# Patient Record
Sex: Male | Born: 1950 | Race: White | Hispanic: No | Marital: Married | Smoking: Former smoker
Health system: Southern US, Community
[De-identification: ages and names within clinical notes are randomized; demographics above are authoritative.]

## PROBLEM LIST (undated history)

## (undated) DIAGNOSIS — R011 Cardiac murmur, unspecified: Secondary | ICD-10-CM

## (undated) DIAGNOSIS — I10 Essential (primary) hypertension: Secondary | ICD-10-CM

## (undated) DIAGNOSIS — E785 Hyperlipidemia, unspecified: Secondary | ICD-10-CM

## (undated) DIAGNOSIS — H409 Unspecified glaucoma: Secondary | ICD-10-CM

## (undated) HISTORY — PX: OTHER SURGICAL HISTORY: SHX169

## (undated) HISTORY — DX: Unspecified glaucoma: H40.9

## (undated) HISTORY — DX: Hyperlipidemia, unspecified: E78.5

## (undated) HISTORY — PX: COLONOSCOPY: SHX174

## (undated) HISTORY — PX: EYE SURGERY: SHX253

## (undated) HISTORY — PX: HERNIA REPAIR: SHX51

## (undated) SURGERY — ROBOTIC ASSISTED LAPAROSCOPIC CHOLECYSTECTOMY
Anesthesia: General

## (undated) SURGERY — ROBOTIC ASSISTED LAPAROSCOPIC CHOLECYSTECTOMY
Anesthesia: Choice

---

## 2006-05-26 DIAGNOSIS — Z860101 Personal history of adenomatous and serrated colon polyps: Secondary | ICD-10-CM | POA: Insufficient documentation

## 2006-10-25 ENCOUNTER — Emergency Department: Payer: Self-pay | Admitting: Emergency Medicine

## 2006-10-25 ENCOUNTER — Other Ambulatory Visit: Payer: Self-pay

## 2007-06-07 ENCOUNTER — Emergency Department: Payer: Self-pay | Admitting: Emergency Medicine

## 2007-06-07 ENCOUNTER — Other Ambulatory Visit: Payer: Self-pay

## 2013-12-17 ENCOUNTER — Ambulatory Visit: Payer: Self-pay | Admitting: Internal Medicine

## 2013-12-17 LAB — CBC WITH DIFFERENTIAL/PLATELET
BASOS PCT: 0.6 %
Basophil #: 0.1 10*3/uL (ref 0.0–0.1)
EOS PCT: 1.9 %
Eosinophil #: 0.2 10*3/uL (ref 0.0–0.7)
HCT: 47.2 % (ref 40.0–52.0)
HGB: 15.9 g/dL (ref 13.0–18.0)
Lymphocyte #: 1.4 10*3/uL (ref 1.0–3.6)
Lymphocyte %: 14.3 %
MCH: 29.1 pg (ref 26.0–34.0)
MCHC: 33.7 g/dL (ref 32.0–36.0)
MCV: 86 fL (ref 80–100)
MONO ABS: 0.8 x10 3/mm (ref 0.2–1.0)
MONOS PCT: 8.2 %
NEUTROS ABS: 7.6 10*3/uL — AB (ref 1.4–6.5)
NEUTROS PCT: 75 %
PLATELETS: 269 10*3/uL (ref 150–440)
RBC: 5.48 10*6/uL (ref 4.40–5.90)
RDW: 13.6 % (ref 11.5–14.5)
WBC: 10.1 10*3/uL (ref 3.8–10.6)

## 2013-12-17 LAB — COMPREHENSIVE METABOLIC PANEL
Albumin: 3.7 g/dL (ref 3.4–5.0)
Alkaline Phosphatase: 77 U/L
Anion Gap: 7 (ref 7–16)
BUN: 9 mg/dL (ref 7–18)
Bilirubin,Total: 0.5 mg/dL (ref 0.2–1.0)
CHLORIDE: 101 mmol/L (ref 98–107)
Calcium, Total: 9.4 mg/dL (ref 8.5–10.1)
Co2: 30 mmol/L (ref 21–32)
Creatinine: 0.93 mg/dL (ref 0.60–1.30)
EGFR (Non-African Amer.): >60
GLUCOSE: 115 mg/dL — AB (ref 65–99)
Osmolality: 275 (ref 275–301)
POTASSIUM: 4.3 mmol/L (ref 3.5–5.1)
SGOT(AST): 15 U/L (ref 15–37)
SGPT (ALT): 29 U/L
Sodium: 138 mmol/L (ref 136–145)
Total Protein: 7.8 g/dL (ref 6.4–8.2)

## 2013-12-17 LAB — AMYLASE: Amylase: 33 U/L (ref 25–115)

## 2013-12-17 LAB — LIPASE, BLOOD: Lipase: 114 U/L (ref 73–393)

## 2014-01-05 DIAGNOSIS — R030 Elevated blood-pressure reading, without diagnosis of hypertension: Secondary | ICD-10-CM

## 2014-01-05 DIAGNOSIS — K219 Gastro-esophageal reflux disease without esophagitis: Secondary | ICD-10-CM | POA: Insufficient documentation

## 2014-01-05 DIAGNOSIS — M25511 Pain in right shoulder: Secondary | ICD-10-CM | POA: Insufficient documentation

## 2014-01-05 DIAGNOSIS — IMO0001 Reserved for inherently not codable concepts without codable children: Secondary | ICD-10-CM | POA: Insufficient documentation

## 2014-06-23 DIAGNOSIS — K644 Residual hemorrhoidal skin tags: Secondary | ICD-10-CM | POA: Insufficient documentation

## 2017-06-20 ENCOUNTER — Ambulatory Visit
Admission: EM | Admit: 2017-06-20 | Discharge: 2017-06-20 | Disposition: A | Discharge status: Home / Self Care | Attending: Family Medicine | Admitting: Family Medicine

## 2017-06-20 ENCOUNTER — Encounter: Payer: Self-pay | Admitting: Gynecology

## 2017-06-20 DIAGNOSIS — R05 Cough: Secondary | ICD-10-CM | POA: Diagnosis not present

## 2017-06-20 DIAGNOSIS — R030 Elevated blood-pressure reading, without diagnosis of hypertension: Secondary | ICD-10-CM | POA: Diagnosis not present

## 2017-06-20 DIAGNOSIS — J0140 Acute pansinusitis, unspecified: Secondary | ICD-10-CM

## 2017-06-20 DIAGNOSIS — R059 Cough, unspecified: Secondary | ICD-10-CM

## 2017-06-20 MED ORDER — DOXYCYCLINE HYCLATE 100 MG PO CAPS: 100.0000 mg | ORAL_CAPSULE | Freq: Two times a day (BID) | ORAL | 0 refills | Status: AC

## 2017-06-20 NOTE — Discharge Instructions (Addendum)
Recommend start Doxycycline 100mg  twice a day as directed. Increase fluid intake to help loosen up mucus. May take OTC cough medication as needed to help sleep at night. Follow-up here in 4 to 5 days if not improving.

## 2017-06-20 NOTE — ED Triage Notes (Signed)
Patient c/o cough x 1 month.

## 2017-06-20 NOTE — ED Provider Notes (Signed)
[facility]    CSN: [id number] Arrival date & time: 06/20/17  1210     History   Chief Complaint Chief Complaint  Patient presents with  . Cough    HPI Derek Figueroa is a 67 y.o. male.   67 year old male presents with sinus congestion, fatigue and cough for over 1 month. Also having some sinus pressure and post nasal drainage that is causing him to cough. Denies any fever or GI symptoms. Has history of recurrent sinus infections but last infection was over 2 to 3 years ago. Has taken Amoxicillin and Augmentin in the past with minimal success. Has take OTC cold and cough medication with minimal relief.  Also has history of HTN- not currently on any medication. Otherwise, no other chronic health issues. Takes no daily medication.    The history is provided by the patient.    History reviewed. No pertinent past medical history.  There are no active problems to display for this patient.   Past Surgical History:  Procedure Laterality Date  . EYE SURGERY         Home Medications    Prior to Admission medications   Medication Sig Start Date End Date Taking? Authorizing Provider  doxycycline (VIBRAMYCIN) 100 MG capsule Take 1 capsule (100 mg total) by mouth 2 (two) times daily for 7 days. 06/20/17 06/27/17  Katy Apo, NP    Family History Family History  Problem Relation Age of Onset  . Heart disease Mother   . Diabetes Mother   . Heart disease Father     Social History Social History   Tobacco Use  . Smoking status: Former Research scientist (life sciences)  . Smokeless tobacco: Never Used  . Tobacco comment: quit 40 yrs  Substance Use Topics  . Alcohol use: Yes  . Drug use: No     Allergies   Patient has no known allergies.   Review of Systems Review of Systems  Constitutional: Positive for fatigue. Negative for activity change, appetite change, chills and fever.  HENT: Positive for congestion, postnasal drip, rhinorrhea, sinus pressure, sinus pain and  sore throat. Negative for ear discharge, ear pain, facial swelling, mouth sores, nosebleeds and trouble swallowing.   Eyes: Negative for pain, discharge, redness and itching.  Respiratory: Positive for cough. Negative for chest tightness, shortness of breath and wheezing.   Gastrointestinal: Negative for abdominal pain, diarrhea, nausea and vomiting.  Musculoskeletal: Negative for arthralgias, myalgias, neck pain and neck stiffness.  Skin: Negative for rash and wound.  Allergic/Immunologic: Negative for immunocompromised state.  Neurological: Positive for headaches. Negative for dizziness, tremors, seizures, syncope, speech difficulty, weakness, light-headedness and numbness.  Hematological: Negative for adenopathy. Does not bruise/bleed easily.     Physical Exam Triage Vital Signs ED Triage Vitals  Enc Vitals Group     BP 06/20/17 1250 (!) 146/96     Pulse Rate 06/20/17 1250 64     Resp 06/20/17 1250 16     Temp 06/20/17 1250 98 F (36.7 C)     Temp Source 06/20/17 1250 Oral     SpO2 06/20/17 1250 99 %     Weight 06/20/17 1249 180 lb (81.6 kg)     Height 06/20/17 1249 5\' 8"  (1.727 m)     Head Circumference --      Peak Flow --      Pain Score 06/20/17 1249 0     Pain Loc --      Pain Edu? --  Excl. in [place]? --    No data found.  Updated Vital Signs BP (!) 146/96 (BP Location: Left Arm)   Pulse 64   Temp 98 F (36.7 C) (Oral)   Resp 16   Ht 5\' 8"  (1.727 m)   Wt 180 lb (81.6 kg)   SpO2 99%   BMI 27.37 kg/m   Visual Acuity Right Eye Distance:   Left Eye Distance:   Bilateral Distance:    Right Eye Near:   Left Eye Near:    Bilateral Near:     Physical Exam  Constitutional: He is oriented to person, place, and time. He appears well-developed and well-nourished. He does not appear ill. No distress.  HENT:  Head: Normocephalic and atraumatic.  Right Ear: Hearing, external ear and ear canal normal. Tympanic membrane is bulging.  Left Ear: Hearing, external ear  and ear canal normal. Tympanic membrane is bulging.  Nose: Mucosal edema and rhinorrhea present. Right sinus exhibits maxillary sinus tenderness and frontal sinus tenderness. Left sinus exhibits no maxillary sinus tenderness and no frontal sinus tenderness.  Mouth/Throat: Uvula is midline and mucous membranes are normal. Posterior oropharyngeal erythema present.  Eyes: Conjunctivae and EOM are normal.  Neck: Normal range of motion. Neck supple.  Cardiovascular: Normal rate, regular rhythm and normal heart sounds.  No murmur heard. Pulmonary/Chest: Effort normal and breath sounds normal. No stridor. No respiratory distress. He has no decreased breath sounds. He has no wheezes. He has no rhonchi. He has no rales.  Musculoskeletal: Normal range of motion.  Lymphadenopathy:    He has no cervical adenopathy.  Neurological: He is alert and oriented to person, place, and time.  Skin: Skin is warm and dry. Capillary refill takes less than 2 seconds.  Psychiatric: He has a normal mood and affect. His behavior is normal. Judgment and thought content normal.     UC Treatments / Results  Labs (all labs ordered are listed, but only abnormal results are displayed) Labs Reviewed - No data to display  EKG  EKG Interpretation None       Radiology No results found.  Procedures Procedures (including critical care time)  Medications Ordered in UC Medications - No data to display   Initial Impression / Assessment and Plan / UC Course  I have reviewed the triage vital signs and the nursing notes.  Pertinent labs & imaging results that were available during my care of the patient were reviewed by me and considered in my medical decision making (see chart for details).    Discussed with patient that he probably has another sinus infection. May start Doxycycline 100mg  twice a day as directed. Increase fluid intake to help loosen up mucus. May take OTC cough medication as needed to help sleep at  night. Avoid OTC decongestants which can raise his blood pressure. Continue to monitor BP. Follow-up here in 4 to 5 days if not improving.    Final Clinical Impressions(s) / UC Diagnoses   Final diagnoses:  Acute non-recurrent pansinusitis  Cough  Elevated blood pressure reading    ED Discharge Orders        Ordered    doxycycline (VIBRAMYCIN) 100 MG capsule  2 times daily     06/20/17 1423       Controlled Substance Prescriptions [place] Controlled Substance Registry consulted? Not Applicable   Katy Apo, NP 06/20/17 1926

## 2017-06-23 ENCOUNTER — Telehealth: Payer: Self-pay | Admitting: *Deleted

## 2017-06-23 NOTE — Telephone Encounter (Signed)
Called patient, no answer, left message advising patient to follow up with PCP or [facility] if symptoms persist or become worse.

## 2017-08-05 ENCOUNTER — Other Ambulatory Visit: Payer: Self-pay

## 2017-08-05 ENCOUNTER — Telehealth: Payer: Self-pay

## 2017-08-05 ENCOUNTER — Ambulatory Visit
Admission: RE | Admit: 2017-08-05 | Discharge: 2017-08-05 | Disposition: A | Discharge status: Home / Self Care | Source: Ambulatory Visit | Attending: Surgery | Admitting: Surgery

## 2017-08-05 ENCOUNTER — Ambulatory Visit
Admission: EM | Admit: 2017-08-05 | Discharge: 2017-08-05 | Disposition: A | Discharge status: Other Acute Inpatient Hospital | Attending: Family Medicine | Admitting: Family Medicine

## 2017-08-05 ENCOUNTER — Ambulatory Visit
Admission: RE | Admit: 2017-08-05 | Discharge: 2017-08-05 | Disposition: A | Discharge status: Home / Self Care | Source: Ambulatory Visit | Attending: Family Medicine | Admitting: Family Medicine

## 2017-08-05 DIAGNOSIS — R17 Unspecified jaundice: Secondary | ICD-10-CM

## 2017-08-05 DIAGNOSIS — K7689 Other specified diseases of liver: Secondary | ICD-10-CM | POA: Insufficient documentation

## 2017-08-05 DIAGNOSIS — N400 Enlarged prostate without lower urinary tract symptoms: Secondary | ICD-10-CM | POA: Insufficient documentation

## 2017-08-05 DIAGNOSIS — R748 Abnormal levels of other serum enzymes: Secondary | ICD-10-CM | POA: Insufficient documentation

## 2017-08-05 DIAGNOSIS — D7389 Other diseases of spleen: Secondary | ICD-10-CM | POA: Insufficient documentation

## 2017-08-05 DIAGNOSIS — K4020 Bilateral inguinal hernia, without obstruction or gangrene, not specified as recurrent: Secondary | ICD-10-CM | POA: Insufficient documentation

## 2017-08-05 DIAGNOSIS — R1013 Epigastric pain: Secondary | ICD-10-CM

## 2017-08-05 DIAGNOSIS — K838 Other specified diseases of biliary tract: Secondary | ICD-10-CM | POA: Insufficient documentation

## 2017-08-05 DIAGNOSIS — I700 Atherosclerosis of aorta: Secondary | ICD-10-CM | POA: Insufficient documentation

## 2017-08-05 DIAGNOSIS — K8020 Calculus of gallbladder without cholecystitis without obstruction: Secondary | ICD-10-CM | POA: Diagnosis not present

## 2017-08-05 LAB — LIPASE, BLOOD: LIPASE: 28 U/L (ref 11–51)

## 2017-08-05 LAB — URINALYSIS, COMPLETE (UACMP) WITH MICROSCOPIC
Bacteria, UA: NONE SEEN
Glucose, UA: NEGATIVE mg/dL
HGB URINE DIPSTICK: NEGATIVE
Ketones, ur: NEGATIVE mg/dL
LEUKOCYTES UA: NEGATIVE
Nitrite: NEGATIVE
PH: 5.5 (ref 5.0–8.0)
Protein, ur: NEGATIVE mg/dL
Specific Gravity, Urine: 1.02 (ref 1.005–1.030)
Squamous Epithelial / LPF: NONE SEEN

## 2017-08-05 LAB — CBC WITH DIFFERENTIAL/PLATELET
Basophils Absolute: 0 10*3/uL (ref 0–0.1)
Basophils Relative: 1 %
EOS ABS: 0.1 10*3/uL (ref 0–0.7)
EOS PCT: 2 %
HCT: 46.5 % (ref 40.0–52.0)
Hemoglobin: 15.9 g/dL (ref 13.0–18.0)
Lymphocytes Relative: 16 %
Lymphs Abs: 0.9 10*3/uL — ABNORMAL LOW (ref 1.0–3.6)
MCH: 28.8 pg (ref 26.0–34.0)
MCHC: 34.1 g/dL (ref 32.0–36.0)
MCV: 84.3 fL (ref 80.0–100.0)
MONO ABS: 0.6 10*3/uL (ref 0.2–1.0)
MONOS PCT: 11 %
Neutro Abs: 3.7 10*3/uL (ref 1.4–6.5)
Neutrophils Relative %: 70 %
PLATELETS: 234 10*3/uL (ref 150–440)
RBC: 5.51 MIL/uL (ref 4.40–5.90)
RDW: 14 % (ref 11.5–14.5)
WBC: 5.3 10*3/uL (ref 3.8–10.6)

## 2017-08-05 LAB — COMPREHENSIVE METABOLIC PANEL
ALK PHOS: 208 U/L — AB (ref 38–126)
ALT: 471 U/L — AB (ref 17–63)
AST: 174 U/L — AB (ref 15–41)
Albumin: 3.8 g/dL (ref 3.5–5.0)
Anion gap: 7 (ref 5–15)
BUN: 10 mg/dL (ref 6–20)
CO2: 26 mmol/L (ref 22–32)
CREATININE: 0.75 mg/dL (ref 0.61–1.24)
Calcium: 9 mg/dL (ref 8.9–10.3)
Chloride: 101 mmol/L (ref 101–111)
GFR calc Af Amer: >60 mL/min (ref >60)
Glucose, Bld: 114 mg/dL — ABNORMAL HIGH (ref 65–99)
Potassium: 4 mmol/L (ref 3.5–5.1)
SODIUM: 134 mmol/L — AB (ref 135–145)
Total Bilirubin: 6.3 mg/dL — ABNORMAL HIGH (ref 0.3–1.2)
Total Protein: 7.7 g/dL (ref 6.5–8.1)

## 2017-08-05 MED ORDER — IOPAMIDOL (ISOVUE-300) INJECTION 61%
100.0000 mL | Freq: Once | INTRAVENOUS | Status: AC | PRN
  Administered 2017-08-05: 100 mL via INTRAVENOUS

## 2017-08-05 NOTE — ED Triage Notes (Addendum)
Pt with epigastric abdominal pain starting in January.  Recently noticed darker than usual urine (tea colored) and stream seems to have changed. Denies painful urination.

## 2017-08-05 NOTE — ED Provider Notes (Signed)
[facility]    CSN: [id number] Arrival date & time: 08/05/17  0850     History   Chief Complaint Chief Complaint  Patient presents with  . Abdominal Pain    HPI Derek Figueroa is a 67 y.o. male.   The history is provided by the patient.  Abdominal Pain  Pain location:  Epigastric Pain quality: aching   Pain radiates to:  Does not radiate Pain severity:  Mild Onset quality:  Sudden Timing:  Constant Progression:  Waxing and waning Chronicity:  New Context: alcohol use (states only drinks one wine cooler 4-5 nights per week)   Context: not awakening from sleep, not diet changes, not eating, not laxative use, not medication withdrawal, not previous surgeries, not recent illness, not recent sexual activity, not recent travel, not retching, not sick contacts, not suspicious food intake and not trauma   Relieved by:  None tried Ineffective treatments:  None tried Associated symptoms: no anorexia, no belching, no chest pain, no chills, no constipation, no cough, no diarrhea, no dysuria, no fatigue, no fever, no flatus, no hematemesis, no hematochezia, no hematuria, no melena, no nausea, no shortness of breath, no sore throat, no vaginal bleeding, no vaginal discharge and no vomiting   Associated symptoms comment:  Dark colored urine; denies dysuria Risk factors: no alcohol abuse, no aspirin use, not elderly, has not had multiple surgeries, no NSAID use, not obese, not pregnant and no recent hospitalization     History reviewed. No pertinent past medical history.  There are no active problems to display for this patient.   Past Surgical History:  Procedure Laterality Date  . EYE SURGERY         Home Medications    Prior to Admission medications   Not on File    Family History Family History  Problem Relation Age of Onset  . Heart disease Mother   . Diabetes Mother   . Heart disease Father     Social History Social History   Tobacco Use  .  Smoking status: Former Research scientist (life sciences)  . Smokeless tobacco: Never Used  . Tobacco comment: quit 40 yrs  Substance Use Topics  . Alcohol use: Yes    Comment: rare  . Drug use: No     Allergies   Patient has no known allergies.   Review of Systems Review of Systems  Constitutional: Negative for chills, fatigue and fever.  HENT: Negative for sore throat.   Respiratory: Negative for cough and shortness of breath.   Cardiovascular: Negative for chest pain.  Gastrointestinal: Positive for abdominal pain. Negative for anorexia, constipation, diarrhea, flatus, hematemesis, hematochezia, melena, nausea and vomiting.  Genitourinary: Negative for dysuria, hematuria, vaginal bleeding and vaginal discharge.     Physical Exam Triage Vital Signs ED Triage Vitals [08/05/17 0912]  Enc Vitals Group     BP (!) 151/94     Pulse Rate 78     Resp 18     Temp 98.4 F (36.9 C)     Temp Source Oral     SpO2 98 %     Weight      Height      Head Circumference      Peak Flow      Pain Score      Pain Loc      Pain Edu?      Excl. in [place]?    No data found.  Updated Vital Signs BP (!) 151/94 (BP Location: Left Arm)  Pulse 78   Temp 98.4 F (36.9 C) (Oral)   Resp 18   SpO2 98%   Visual Acuity Right Eye Distance:   Left Eye Distance:   Bilateral Distance:    Right Eye Near:   Left Eye Near:    Bilateral Near:     Physical Exam  Constitutional: He is oriented to person, place, and time. He appears well-developed and well-nourished. No distress.  HENT:  Head: Normocephalic and atraumatic.  Cardiovascular: Normal rate, regular rhythm, normal heart sounds and intact distal pulses.  No murmur heard. Pulmonary/Chest: Effort normal and breath sounds normal. No respiratory distress. He has no wheezes. He has no rales.  Abdominal: Soft. Bowel sounds are normal. He exhibits no distension and no mass. There is tenderness (mild, epigastric). There is no rebound and no guarding.  Neurological: He  is alert and oriented to person, place, and time.  Skin: No rash noted. He is not diaphoretic.  Nursing note and vitals reviewed.    UC Treatments / Results  Labs (all labs ordered are listed, but only abnormal results are displayed) Labs Reviewed  URINALYSIS, COMPLETE (UACMP) WITH MICROSCOPIC - Abnormal; Notable for the following components:      Result Value   Color, Urine AMBER (*)    Bilirubin Urine LARGE (*)    All other components within normal limits  COMPREHENSIVE METABOLIC PANEL - Abnormal; Notable for the following components:   Sodium 134 (*)    Glucose, Bld 114 (*)    AST 174 (*)    ALT 471 (*)    Alkaline Phosphatase 208 (*)    Total Bilirubin 6.3 (*)    All other components within normal limits  CBC WITH DIFFERENTIAL/PLATELET - Abnormal; Notable for the following components:   Lymphs Abs 0.9 (*)    All other components within normal limits  LIPASE, BLOOD    EKG  EKG Interpretation None       Radiology No results found.  Procedures Procedures (including critical care time)  Medications Ordered in UC Medications - No data to display   Initial Impression / Assessment and Plan / UC Course  I have reviewed the triage vital signs and the nursing notes.  Pertinent labs & imaging results that were available during my care of the patient were reviewed by me and considered in my medical decision making (see chart for details).       Final Clinical Impressions(s) / UC Diagnoses   Final diagnoses:  Hyperbilirubinemia  Elevated liver enzymes    ED Discharge Orders        Ordered    US Abdomen Limited RUQ     08/05/17 1104     1. diagnosis reviewed with patient 2. Recommend RUQ [place] (to be done today at [facility]); further management (GI and/or Gen Surgery) pending [place] result; will notify patient of [place] result and further management;  discussed with patient if symptoms worsen then go to ED.   Controlled Substance Prescriptions [place] Controlled Substance  Registry consulted? Not Applicable   Norval Gable, MD 08/05/17 1118

## 2017-08-05 NOTE — Telephone Encounter (Signed)
Annie Main, RN from Urgent Care called stating that they had seen patient today and he wanted [place] to see him. I told him that I would speak with Dr. Dahlia Byes and ask when we could see the patient. I told him that I would call him back. I then spoke with Dr. Dahlia Byes and he was able to review his chart and told me to schedule him a STAT CT Scan of the abdomen and that he will see him after. Dr. Dahlia Byes also wants me to schedule him an appointment with Dr. Allen Norris as soon as possible. I will contact Dr. Dorothey Baseman office and ask for an appointment. I will call patient to let him know.

## 2017-08-05 NOTE — Telephone Encounter (Signed)
Called patient to let him know that he needs to go to the [facility] to have the CT Scan done. Patient stated that he will be on his way. I told him that I will call him tomorrow morning with his results. However, Dr. Dahlia Byes will need to see him tomorrow in the afternoon. Patient is aware of the appointment.  I also told patient that once I had the results from his CT Scan, that I will need to contact the GI doctor and schedule him an appointment. Patient understood.

## 2017-08-06 ENCOUNTER — Telehealth: Payer: Self-pay

## 2017-08-06 ENCOUNTER — Ambulatory Visit (INDEPENDENT_AMBULATORY_CARE_PROVIDER_SITE_OTHER): Admitting: Surgery

## 2017-08-06 ENCOUNTER — Encounter: Payer: Self-pay | Admitting: Surgery

## 2017-08-06 DIAGNOSIS — R17 Unspecified jaundice: Secondary | ICD-10-CM

## 2017-08-06 NOTE — Progress Notes (Signed)
Patient ID: Derek Figueroa, male   DOB: [birth year], 67 y.o.   MRN: [id number]  HPI Derek Figueroa is a 67 y.o. male seen in consultation at the request of Dr. Cornelius Moras.  Was recently seen by him secondary to some epigastric pain and dark urine.  He also reports some light colored stool.  Has been having this pain for the last 4 days in an intermittent fashion.  The pain is sharp and moderate in severity.  No nausea no vomiting, no fevers no chills. Ultrasound personally reviewed showing evidence of cholelithiasis with enlarged common bile duct.  I have ordered a CT scan of the abdomen and pelvis that have personally reviewed showing evidence of biliary dilation.  No evidence of pancreatic masses or duodenal masses. CBC was normal and alkaline phosphatase was 208 and bilirubin was 6.3.  AST and ALT were elevated. He is able to perform more than 6 METs of activity without any shortness of breath or chest pain  HPI  History reviewed. No pertinent past medical history.  Past Surgical History:  Procedure Laterality Date  . EYE SURGERY      Family History  Problem Relation Age of Onset  . Heart disease Mother   . Diabetes Mother   . Stroke Mother   . Heart disease Father   . COPD Father   . Lung cancer Brother   . COPD Brother   . Cancer Maternal Grandmother   . Cancer Maternal Grandfather     Social History Social History   Tobacco Use  . Smoking status: Former Research scientist (life sciences)  . Smokeless tobacco: Never Used  . Tobacco comment: quit 40 yrs  Substance Use Topics  . Alcohol use: Yes    Comment: rare  . Drug use: No    No Known Allergies  Current Outpatient Medications  Medication Sig Dispense Refill  . Multiple Vitamins-Minerals (MULTIVITAMIN ADULT PO) Take 1 tablet by mouth 1 day or 1 dose.    . Omega-3 Fatty Acids (FISH OIL PO) Take 1 capsule by mouth 1 day or 1 dose.     No current facility-administered medications for this visit.      Review of Systems Full ROS  was  asked and was negative except for the information on the HPI  Physical Exam Blood pressure (!) 163/96, pulse 83, temperature 97.8 F (36.6 C), temperature source Oral, height 5\' 8"  (1.727 m), weight 79.7 kg (175 lb 9.6 oz). CONSTITUTIONAL: NAD EYES: Pupils are equal, round, and reactive to light, Sclera are non-icteric. EARS, NOSE, MOUTH AND THROAT: The oropharynx is clear. The oral mucosa is pink and moist. Hearing is intact to voice. LYMPH NODES:  Lymph nodes in the neck are normal. RESPIRATORY:  Lungs are clear. There is normal respiratory effort, with equal breath sounds bilaterally, and without pathologic use of accessory muscles. CARDIOVASCULAR: Heart is regular without murmurs, gallops, or rubs. GI: The abdomen is  soft, nontender, and nondistended. There are no palpable masses. There is no hepatosplenomegaly. There are normal bowel sounds in all quadrants. Reducible umbilical hernia GU: Rectal deferred.   MUSCULOSKELETAL: Normal muscle strength and tone. No cyanosis or edema.   SKIN: Turgor is good and there are no pathologic skin lesions or ulcers. NEUROLOGIC: Motor and sensation is grossly normal. Cranial nerves are grossly intact. PSYCH:  Oriented to person, place and time. Affect is normal.  Data Reviewed  I have personally reviewed the patient's imaging, laboratory findings and medical records.    Assessment/Plan Obstructive jaundice associated  with cholelithiasis.  No evidence of CT scan of a pancreatic mass.  Differential will include choledocholithiasis versus a mass within the common bile duct.  I will order an MRCP and also order a consultation with Dr. Allen Norris for  an ERCP. He is not toxic and is behaving more like a neoplastic process than choledocholithiasis.  In fact it is choledocholithiasis and were able to clear his common bile duct he will require a cholecystectomy at some point in time.  First order of business is to deal with the biliary obstruction. Intensive  counseling provided.  Please note that I copy of this report will be sent to the referring provider  Caroleen Hamman, [name] General Surgeon 08/06/2017, 2:45 PM

## 2017-08-06 NOTE — Telephone Encounter (Signed)
MRCP scheduled 08/11/17 @ 8:30 [facility]. Nothing by mouth 6 hours prior.

## 2017-08-06 NOTE — Telephone Encounter (Signed)
Left message for Derek Figueroa to call office in regards to scheduling MRCP.

## 2017-08-06 NOTE — Patient Instructions (Addendum)
We will call you later today with the appointment information for MRCP test.  Please see your follow up appointment listed below.

## 2017-08-11 ENCOUNTER — Other Ambulatory Visit: Payer: Self-pay

## 2017-08-11 ENCOUNTER — Telehealth: Payer: Self-pay | Admitting: Gastroenterology

## 2017-08-11 ENCOUNTER — Other Ambulatory Visit: Payer: Self-pay | Admitting: Surgery

## 2017-08-11 ENCOUNTER — Telehealth: Payer: Self-pay

## 2017-08-11 ENCOUNTER — Ambulatory Visit
Admission: RE | Admit: 2017-08-11 | Discharge: 2017-08-11 | Disposition: A | Discharge status: Home / Self Care | Source: Ambulatory Visit | Attending: Surgery | Admitting: Surgery

## 2017-08-11 DIAGNOSIS — K8070 Calculus of gallbladder and bile duct without cholecystitis without obstruction: Secondary | ICD-10-CM | POA: Diagnosis not present

## 2017-08-11 DIAGNOSIS — I700 Atherosclerosis of aorta: Secondary | ICD-10-CM | POA: Diagnosis not present

## 2017-08-11 DIAGNOSIS — D734 Cyst of spleen: Secondary | ICD-10-CM | POA: Insufficient documentation

## 2017-08-11 DIAGNOSIS — R17 Unspecified jaundice: Secondary | ICD-10-CM | POA: Diagnosis present

## 2017-08-11 DIAGNOSIS — K7689 Other specified diseases of liver: Secondary | ICD-10-CM | POA: Insufficient documentation

## 2017-08-11 DIAGNOSIS — K831 Obstruction of bile duct: Secondary | ICD-10-CM

## 2017-08-11 MED ORDER — GADOBENATE DIMEGLUMINE 529 MG/ML IV SOLN
15.0000 mL | Freq: Once | INTRAVENOUS | Status: AC | PRN
  Administered 2017-08-11: 15 mL via INTRAVENOUS

## 2017-08-11 NOTE — Telephone Encounter (Signed)
Freda Munro with [facility] calling to get apt with Dr Allen Norris for pt to get poss ERCP done she states pt needs to be seen before next Thursday please call Freda Munro in [facility] office  Dr. Billy Coast wants pt to be seen

## 2017-08-11 NOTE — Telephone Encounter (Signed)
Pt has been scheduled for an ERCP at [facility] on 08/13/17. Pt has been notified of this procedure along with instructions.

## 2017-08-11 NOTE — Telephone Encounter (Signed)
Patient was seen by Dr. Dahlia Byes on 08/06/2017 and will be followed.

## 2017-08-12 ENCOUNTER — Encounter: Payer: Self-pay | Admitting: Emergency Medicine

## 2017-08-12 ENCOUNTER — Telehealth: Payer: Self-pay

## 2017-08-12 NOTE — Telephone Encounter (Signed)
Called patient to give him his MRCP results.

## 2017-08-12 NOTE — Addendum Note (Signed)
Addended by: Caroleen Hamman F on: 08/12/2017 07:22 PM   Modules accepted: Orders, SmartSet

## 2017-08-12 NOTE — Telephone Encounter (Signed)
-----   Message from Jules Husbands, MD sent at 08/11/2017  7:11 PM EDT ----- Please let him know that he does have stones on his CBD and will need an ERCP by Dr. Allen Norris . Already scheduled for the 08/13/17. HE may f/u w me next week in the office. Thx ----- Message ----- From: Interface, Rad Results In Sent: 08/11/2017  10:48 AM To: Jules Husbands, MD

## 2017-08-13 ENCOUNTER — Other Ambulatory Visit: Payer: Self-pay

## 2017-08-13 ENCOUNTER — Encounter
Admission: RE | Disposition: A | Discharge status: Home / Self Care | Payer: Self-pay | Source: Ambulatory Visit | Attending: Gastroenterology

## 2017-08-13 ENCOUNTER — Ambulatory Visit

## 2017-08-13 ENCOUNTER — Ambulatory Visit
Admission: RE | Admit: 2017-08-13 | Discharge: 2017-08-13 | Disposition: A | Discharge status: Home / Self Care | Source: Ambulatory Visit | Attending: Gastroenterology | Admitting: Gastroenterology

## 2017-08-13 ENCOUNTER — Ambulatory Visit: Admitting: Anesthesiology

## 2017-08-13 ENCOUNTER — Encounter: Payer: Self-pay | Admitting: *Deleted

## 2017-08-13 DIAGNOSIS — R932 Abnormal findings on diagnostic imaging of liver and biliary tract: Secondary | ICD-10-CM

## 2017-08-13 DIAGNOSIS — R748 Abnormal levels of other serum enzymes: Secondary | ICD-10-CM | POA: Diagnosis not present

## 2017-08-13 DIAGNOSIS — K8050 Calculus of bile duct without cholangitis or cholecystitis without obstruction: Secondary | ICD-10-CM | POA: Insufficient documentation

## 2017-08-13 DIAGNOSIS — K219 Gastro-esophageal reflux disease without esophagitis: Secondary | ICD-10-CM | POA: Insufficient documentation

## 2017-08-13 DIAGNOSIS — R17 Unspecified jaundice: Secondary | ICD-10-CM | POA: Diagnosis not present

## 2017-08-13 DIAGNOSIS — K5790 Diverticulosis of intestine, part unspecified, without perforation or abscess without bleeding: Secondary | ICD-10-CM | POA: Insufficient documentation

## 2017-08-13 DIAGNOSIS — K831 Obstruction of bile duct: Secondary | ICD-10-CM | POA: Diagnosis not present

## 2017-08-13 DIAGNOSIS — Z87891 Personal history of nicotine dependence: Secondary | ICD-10-CM | POA: Diagnosis not present

## 2017-08-13 HISTORY — PX: ERCP: SHX5425

## 2017-08-13 SURGERY — ERCP, WITH INTERVENTION IF INDICATED
Anesthesia: General | Wound class: Clean Contaminated

## 2017-08-13 MED ORDER — LIDOCAINE HCL (PF) 2 % IJ SOLN
INTRAMUSCULAR | Status: AC
  Filled 2017-08-13: qty 10

## 2017-08-13 MED ORDER — FENTANYL CITRATE (PF) 100 MCG/2ML IJ SOLN
INTRAMUSCULAR | Status: AC
  Filled 2017-08-13: qty 2

## 2017-08-13 MED ORDER — INDOMETHACIN 50 MG RE SUPP
100.0000 mg | Freq: Once | RECTAL | Status: AC
  Administered 2017-08-13: 100 mg via RECTAL

## 2017-08-13 MED ORDER — FENTANYL CITRATE (PF) 100 MCG/2ML IJ SOLN
INTRAMUSCULAR | Status: DC | PRN
  Administered 2017-08-13: 50 ug via INTRAVENOUS

## 2017-08-13 MED ORDER — PROPOFOL 10 MG/ML IV BOLUS
INTRAVENOUS | Status: AC
  Filled 2017-08-13: qty 20

## 2017-08-13 MED ORDER — EPHEDRINE SULFATE 50 MG/ML IJ SOLN
INTRAMUSCULAR | Status: DC | PRN
  Administered 2017-08-13: 10 mg via INTRAVENOUS

## 2017-08-13 MED ORDER — INDOMETHACIN 50 MG RE SUPP
RECTAL | Status: AC
  Filled 2017-08-13: qty 2

## 2017-08-13 MED ORDER — SODIUM CHLORIDE 0.9 % IV SOLN
INTRAVENOUS | Status: DC
  Administered 2017-08-13: 10:00:00 via INTRAVENOUS

## 2017-08-13 MED ORDER — PROPOFOL 500 MG/50ML IV EMUL
INTRAVENOUS | Status: DC | PRN
  Administered 2017-08-13: 180 ug/kg/min via INTRAVENOUS

## 2017-08-13 MED ORDER — MIDAZOLAM HCL 2 MG/2ML IJ SOLN
INTRAMUSCULAR | Status: AC
  Filled 2017-08-13: qty 2

## 2017-08-13 MED ORDER — MIDAZOLAM HCL 2 MG/2ML IJ SOLN
INTRAMUSCULAR | Status: DC | PRN
  Administered 2017-08-13: 2 mg via INTRAVENOUS

## 2017-08-13 MED ORDER — LIDOCAINE HCL (PF) 2 % IJ SOLN
INTRAMUSCULAR | Status: DC | PRN
  Administered 2017-08-13: 100 mg via INTRADERMAL

## 2017-08-13 NOTE — Transfer of Care (Signed)
Immediate Anesthesia Transfer of Care Note  Patient: Derek Figueroa  Procedure(s) Performed: ENDOSCOPIC RETROGRADE CHOLANGIOPANCREATOGRAPHY (ERCP) (N/A )  Patient Location: PACU  Anesthesia Type:General  Level of Consciousness: awake  Airway & Oxygen Therapy: Patient Spontanous Breathing and Patient connected to nasal cannula oxygen  Post-op Assessment: Report given to RN and Post -op Vital signs reviewed and stable  Post vital signs: Reviewed and stable  Last Vitals:  Vitals Value Taken Time  BP 131/83 08/13/2017 10:58 AM  Temp    Pulse 74 08/13/2017 11:00 AM  Resp 9 08/13/2017 11:00 AM  SpO2 100 % 08/13/2017 11:00 AM  Vitals shown include unvalidated device data.  Last Pain:  Vitals:   08/13/17 1050  TempSrc: Tympanic  PainSc:          Complications: No apparent anesthesia complications

## 2017-08-13 NOTE — Op Note (Signed)
[facility] Gastroenterology Patient Name: Derek Figueroa Procedure Date: 08/13/2017 9:38 AM MRN: [id number] Account #: [id number] Date of Birth: 03-18-1951 Admit Type: Outpatient Age: 67 Room: [facility] ENDO ROOM 4 Gender: Male Note Status: Finalized Procedure:            ERCP Indications:          Bile duct stone(s), Jaundice, Elevated liver enzymes Providers:            Lucilla Lame MD, MD Referring MD:         Dr. Dahlia Byes Medicines:            Propofol per Anesthesia Complications:        No immediate complications. Procedure:            Pre-Anesthesia Assessment:                       - Prior to the procedure, a History and Physical was                        performed, and patient medications and allergies were                        reviewed. The patient's tolerance of previous                        anesthesia was also reviewed. The risks and benefits of                        the procedure and the sedation options and risks were                        discussed with the patient. All questions were                        answered, and informed consent was obtained. Prior                        Anticoagulants: The patient has taken no previous                        anticoagulant or antiplatelet agents. ASA Grade                        Assessment: II - A patient with mild systemic disease.                        After reviewing the risks and benefits, the patient was                        deemed in satisfactory condition to undergo the                        procedure.                       After obtaining informed consent, the scope was passed                        under direct vision. Throughout the procedure, the  patient's blood pressure, pulse, and oxygen saturations                        were monitored continuously. The Endosonoscope was                        introduced through the mouth, and used to inject   contrast into and used to inject contrast into the bile                        duct. The ERCP was accomplished without difficulty. The                        patient tolerated the procedure well. Findings:      The scout film was normal. The major papilla was located entirely within       a diverticulum. The major papilla was bulging. The bile duct was deeply       cannulated with the short-nosed traction sphincterotome. Contrast was       injected. I personally interpreted the bile duct images. There was brisk       flow of contrast through the ducts. Image quality was excellent.       Contrast extended to the entire biliary tree. The lower third of the       main bile duct contained filling defect(s). The main bile duct was       diffusely dilated, with a stone causing an obstruction. A wire was       passed into the biliary tree. A 7 mm biliary sphincterotomy was made       with a traction (standard) sphincterotome using ERBE electrocautery.       There was no post-sphincterotomy bleeding. To discover objects, the       biliary tree was swept with a 15 mm balloon starting at the bifurcation.       Sludge was swept from the duct. A few stones were removed. No stones       remained. The Ampulla was biopsied with a [Device] [Purpose]. Impression:           - The major papilla was located entirely within a                        diverticulum.                       - The major papilla appeared to be bulging.                       - A filling defect was seen on the cholangiogram.                       - The entire main bile duct was dilated, with a stone                        causing an obstruction.                       - Choledocholithiasis was found. Complete removal was                        accomplished by biliary sphincterotomy and balloon  extraction.                       - A biliary sphincterotomy was performed.                       - The biliary tree was  swept. Recommendation:       - Clear liquid diet today.                       - Continue present medications.                       - Return to referring physician [Return Day]. Procedure Code(s):    --- Professional ---                       [phone], Endoscopic retrograde cholangiopancreatography                        (ERCP); with removal of calculi/debris from                        biliary/pancreatic duct(s)                       [id number], Endoscopic retrograde cholangiopancreatography                        (ERCP); with sphincterotomy/papillotomy                       [phone], Endoscopic catheterization of the biliary ductal                        system, radiological supervision and interpretation Diagnosis Code(s):    --- Professional ---                       R17, Unspecified jaundice                       R74.8, Abnormal levels of other serum enzymes                       R93.2, Abnormal findings on diagnostic imaging of liver                        and biliary tract CPT copyright 2016 American Medical Association. All rights reserved. The codes documented in this report are preliminary and upon coder review may  be revised to meet current compliance requirements. Lucilla Lame MD, MD 08/13/2017 10:50:31 AM This report has been signed electronically. Number of Addenda: 0 Note Initiated On: 08/13/2017 9:38 AM      [facility]

## 2017-08-13 NOTE — Anesthesia Postprocedure Evaluation (Signed)
Anesthesia Post Note  Patient: Derek Figueroa  Procedure(s) Performed: ENDOSCOPIC RETROGRADE CHOLANGIOPANCREATOGRAPHY (ERCP) (N/A )  Patient location during evaluation: Endoscopy Anesthesia Type: General Level of consciousness: awake and alert Pain management: pain level controlled Vital Signs Assessment: post-procedure vital signs reviewed and stable Respiratory status: spontaneous breathing, nonlabored ventilation and respiratory function stable Cardiovascular status: blood pressure returned to baseline and stable Postop Assessment: no apparent nausea or vomiting Anesthetic complications: no     Last Vitals:  Vitals Value Taken Time  BP    Temp    Pulse    Resp    SpO2      Last Pain:  Vitals:   08/13/17 1050  TempSrc: Tympanic  PainSc:                  Alphonsus Sias

## 2017-08-13 NOTE — Anesthesia Preprocedure Evaluation (Addendum)
Anesthesia Evaluation  Patient identified by MRN, date of birth, ID band Patient awake    Reviewed: Allergy & Precautions, H&P , NPO status , reviewed documented beta blocker date and time   Airway Mallampati: II  TM Distance: >3 FB     Dental  (+) Chipped Irregular alignment of teeth:   Pulmonary former smoker,    Pulmonary exam normal        Cardiovascular Normal cardiovascular exam     Neuro/Psych    GI/Hepatic GERD  Controlled,  Endo/Other    Renal/GU      Musculoskeletal   Abdominal   Peds  Hematology   Anesthesia Other Findings Jaundiced 2 to stone blocking duct  Reproductive/Obstetrics                            Anesthesia Physical Anesthesia Plan  ASA: II  Anesthesia Plan: General   Post-op Pain Management:    Induction:   PONV Risk Score and Plan: 2 and Propofol infusion  Airway Management Planned:   Additional Equipment:   Intra-op Plan:   Post-operative Plan:   Informed Consent: I have reviewed the patients History and Physical, chart, labs and discussed the procedure including the risks, benefits and alternatives for the proposed anesthesia with the patient or authorized representative who has indicated his/her understanding and acceptance.   Dental Advisory Given  Plan Discussed with: CRNA  Anesthesia Plan Comments:        Anesthesia Quick Evaluation

## 2017-08-13 NOTE — Anesthesia Procedure Notes (Signed)
Date/Time: 08/13/2017 10:20 AM Performed by: Allean Found, CRNA Pre-anesthesia Checklist: Patient identified, Emergency Drugs available, Suction available, Patient being monitored and Timeout performed Patient Re-evaluated:Patient Re-evaluated prior to induction Oxygen Delivery Method: Nasal cannula Placement Confirmation: positive ETCO2

## 2017-08-13 NOTE — H&P (Signed)
Lucilla Lame, MD [address] [place], [place] [address] [phone] Fax : [phone]  Primary Care Physician:  Patient, No Pcp Per Primary Gastroenterologist:  Dr. Allen Norris  Pre-Procedure History & Physical: HPI:  Derek Figueroa is a 67 y.o. male is here for an ERCP.   History reviewed. No pertinent past medical history.  Past Surgical History:  Procedure Laterality Date  . EYE SURGERY    . HERNIA REPAIR      Prior to Admission medications   Medication Sig Start Date End Date Taking? Authorizing Provider  Multiple Vitamins-Minerals (MULTIVITAMIN ADULT PO) Take 1 tablet by mouth 1 day or 1 dose.   Yes [provider]  Omega-3 Fatty Acids (FISH OIL PO) Take 1 capsule by mouth 1 day or 1 dose.   Yes [provider]    Allergies as of 08/11/2017  . (No Known Allergies)    Family History  Problem Relation Age of Onset  . Heart disease Mother   . Diabetes Mother   . Stroke Mother   . Heart disease Father   . COPD Father   . Lung cancer Brother   . COPD Brother   . Cancer Maternal Grandmother   . Cancer Maternal Grandfather     Social History   Socioeconomic History  . Marital status: Single    Spouse name: Not on file  . Number of children: Not on file  . Years of education: Not on file  . Highest education level: Not on file  Occupational History  . Not on file  Social Needs  . Financial resource strain: Not on file  . Food insecurity:    Worry: Not on file    Inability: Not on file  . Transportation needs:    Medical: Not on file    Non-medical: Not on file  Tobacco Use  . Smoking status: Former Research scientist (life sciences)  . Smokeless tobacco: Never Used  . Tobacco comment: quit 40 yrs  Substance and Sexual Activity  . Alcohol use: Yes    Alcohol/week: 1.8 oz    Types: 3 Glasses of wine per week  . Drug use: No  . Sexual activity: Not on file  Lifestyle  . Physical activity:    Days per week: Not on file    Minutes per  session: Not on file  . Stress: Not on file  Relationships  . Social connections:    Talks on phone: Not on file    Gets together: Not on file    Attends religious service: Not on file    Active member of club or organization: Not on file    Attends meetings of clubs or organizations: Not on file    Relationship status: Not on file  . Intimate partner violence:    Fear of current or ex partner: Not on file    Emotionally abused: Not on file    Physically abused: Not on file    Forced sexual activity: Not on file  Other Topics Concern  . Not on file  Social History Narrative  . Not on file    Review of Systems: See HPI, otherwise negative ROS  Physical Exam: BP 136/78   Pulse 60   Temp 97.6 F (36.4 C) (Tympanic)   Resp 18   Ht 5\' 8"  (1.727 m)   Wt 175 lb (79.4 kg)   SpO2 100%   BMI 26.61 kg/m  General:   Alert,  pleasant and cooperative in NAD Head:  Normocephalic and atraumatic. Neck:  Supple; no masses or thyromegaly. Lungs:  Clear throughout to auscultation.    Heart:  Regular rate and rhythm. Abdomen:  Soft, nontender and nondistended. Normal bowel sounds, without guarding, and without rebound.   Neurologic:  Alert and  oriented x4;  grossly normal neurologically.  Impression/Plan: Derek Figueroa is here for an ERCP to be performed for CBD stone   Risks, benefits, limitations, and alternatives regarding  ERCP have been reviewed with the patient.  Questions have been answered.  All parties agreeable.   Lucilla Lame, MD  08/13/2017, 10:10 AM

## 2017-08-13 NOTE — Anesthesia Post-op Follow-up Note (Signed)
Anesthesia QCDR form completed.        

## 2017-08-14 ENCOUNTER — Encounter: Payer: Self-pay | Admitting: Gastroenterology

## 2017-08-14 ENCOUNTER — Telehealth: Payer: Self-pay | Admitting: Surgery

## 2017-08-14 NOTE — Telephone Encounter (Signed)
Patients calling said he has surgery schedule for April 9th and is wanting to be seen sooner for his surgery. Please call patient and advise.

## 2017-08-17 NOTE — Telephone Encounter (Signed)
Patient will be seen on 08/20/2017 to discuss his ERCP and MRCP. Dr. Dahlia Byes will discuss surgery with patient. Patient would like his surgery to be done on 09/08/2017.

## 2017-08-19 LAB — SURGICAL PATHOLOGY

## 2017-08-20 ENCOUNTER — Ambulatory Visit (INDEPENDENT_AMBULATORY_CARE_PROVIDER_SITE_OTHER): Admitting: Surgery

## 2017-08-20 ENCOUNTER — Encounter: Payer: Self-pay | Admitting: Surgery

## 2017-08-20 VITALS — BP 157/72 | HR 60 | Temp 97.9°F | Ht 68.0 in | Wt 176.0 lb

## 2017-08-20 DIAGNOSIS — K8020 Calculus of gallbladder without cholecystitis without obstruction: Secondary | ICD-10-CM | POA: Diagnosis not present

## 2017-08-20 NOTE — Progress Notes (Signed)
Outpatient Surgical Follow Up  08/20/2017  [name] is an 67 y.o. male.   Chief Complaint  Patient presents with  . Follow-up    Biliary Colic- Discuss MRCP/ERCP-Discuss surgery date    HPI: Cholelithiasis status post ERCP.  He is doing well no severe obstruction.  His urine is now normal.  No fevers no chills.  Some discomfort occasionally. Is able to perform more than 4 metastases of activity without any shortness of breath or chest pain. ERCP personally reviewed.  Common bile duct clear  No past medical history on file.  Past Surgical History:  Procedure Laterality Date  . ERCP N/A 08/13/2017   Procedure: ENDOSCOPIC RETROGRADE CHOLANGIOPANCREATOGRAPHY (ERCP);  Surgeon: Lucilla Lame, MD;  Location: [facility] ENDOSCOPY;  Service: Endoscopy;  Laterality: N/A;  . EYE SURGERY    . HERNIA REPAIR      Family History  Problem Relation Age of Onset  . Heart disease Mother   . Diabetes Mother   . Stroke Mother   . Heart disease Father   . COPD Father   . Lung cancer Brother   . COPD Brother   . Cancer Maternal Grandmother   . Cancer Maternal Grandfather     Social History:  reports that he has quit smoking. He has never used smokeless tobacco. He reports that he drinks about 1.8 oz of alcohol per week. He reports that he does not use drugs.  Allergies: No Known Allergies  Medications reviewed.    ROS Full ROS performed and is otherwise negative other than what is stated in HPI   BP (!) 157/72   Pulse 60   Temp 97.9 F (36.6 C) (Oral)   Ht 5\' 8"  (1.727 m)   Wt 79.8 kg (176 lb)   BMI 26.76 kg/m   Physical Exam  Constitutional: He is oriented to person, place, and time and well-developed, well-nourished, and in no distress. No distress.  Neck: No JVD present.  Cardiovascular: Normal rate, regular rhythm and normal heart sounds. Exam reveals no friction rub.  No murmur heard. Pulmonary/Chest: Effort normal. No stridor. No respiratory distress. He has no  wheezes. He has no rales. He exhibits no tenderness.  Abdominal: Soft. He exhibits no distension. There is no tenderness. There is no rebound and no guarding.  Neurological: He is alert and oriented to person, place, and time. Gait normal. GCS score is 15.  Skin: Skin is warm and dry. He is not diaphoretic.  Psychiatric: Mood, memory, affect and judgment normal.  Nursing note and vitals reviewed.     Assessment/Plan: Plan for Robotic assisted lap cholecystectomy The risks, benefits, complications, treatment options, and expected outcomes were discussed with the patient. The possibilities of bleeding, recurrent infection, finding a normal gallbladder, perforation of viscus organs, damage to surrounding structures, bile leak, abscess formation, needing a drain placed, the need for additional procedures, reaction to medication, pulmonary aspiration,  failure to diagnose a condition, the possible need to convert to an open procedure, and creating a complication requiring transfusion or operation were discussed with the patient. The patient and/or family concurred with the proposed plan, giving informed consent.   Greater than 50% of the 25 minutes  visit was spent in counseling/coordination of care   Caroleen Hamman, MD [place] Surgeon

## 2017-08-21 ENCOUNTER — Telehealth: Payer: Self-pay | Admitting: Surgery

## 2017-08-21 NOTE — Telephone Encounter (Signed)
Pt advised of pre op date/time and sx date. Sx: 09/09/17 with Dr Kris Mouton assisted laparoscopic cholecystectomy.  Pre op: 09/01/17 between 9-1:00pm--phone interview.   Patient made aware to call [phone], between 1-3:00pm the day before surgery, to find out what time to arrive.

## 2017-08-25 NOTE — Telephone Encounter (Signed)
LVM for pt to return my call and schedule ERCP follow up appt.

## 2017-08-27 ENCOUNTER — Telehealth: Payer: Self-pay

## 2017-08-27 NOTE — Telephone Encounter (Signed)
Left vm for pt to return my call to schedule follow up appt.  

## 2017-08-27 NOTE — Telephone Encounter (Signed)
-----   Message from Lucilla Lame, MD sent at 08/21/2017  9:40 AM EDT ----- Please have the patient come in for a follow up.

## 2017-08-28 NOTE — Telephone Encounter (Signed)
Pt returned call and was scheduled for an office appt.

## 2017-09-01 ENCOUNTER — Encounter: Payer: Self-pay | Admitting: Gastroenterology

## 2017-09-01 ENCOUNTER — Other Ambulatory Visit: Payer: Self-pay

## 2017-09-01 ENCOUNTER — Telehealth: Payer: Self-pay

## 2017-09-01 ENCOUNTER — Ambulatory Visit (INDEPENDENT_AMBULATORY_CARE_PROVIDER_SITE_OTHER): Admitting: Gastroenterology

## 2017-09-01 ENCOUNTER — Encounter
Admission: RE | Admit: 2017-09-01 | Discharge: 2017-09-01 | Disposition: A | Discharge status: Home / Self Care | Source: Ambulatory Visit | Attending: Surgery | Admitting: Surgery

## 2017-09-01 VITALS — BP 129/80 | HR 80 | Ht 68.0 in | Wt 175.0 lb

## 2017-09-01 DIAGNOSIS — D135 Benign neoplasm of extrahepatic bile ducts: Secondary | ICD-10-CM | POA: Diagnosis not present

## 2017-09-01 DIAGNOSIS — Z01818 Encounter for other preprocedural examination: Secondary | ICD-10-CM | POA: Insufficient documentation

## 2017-09-01 HISTORY — DX: Cardiac murmur, unspecified: R01.1

## 2017-09-01 NOTE — Patient Instructions (Signed)
Your procedure is scheduled on: 09/09/17 Report to Day Surgery. [facility] SECOND FLOOR To find out your arrival time please call [phone] between 1PM - 3PM on 09/08/17.  Remember: Instructions that are not followed completely may result in serious medical risk, up to and including death, or upon the discretion of your surgeon and anesthesiologist your surgery may need to be rescheduled.     _X__ 1. Do not eat food after midnight the night before your procedure.                 No gum chewing or hard candies. You may drink clear liquids up to 2 hours                 before you are scheduled to arrive for your surgery- DO not drink clear                 liquids within 2 hours of the start of your surgery.                 Clear Liquids include:  water, apple juice without pulp, clear carbohydrate                 drink such as Clearfast of Gartorade, Black Coffee or Tea (Do not add                 anything to coffee or tea).  __X__2.  On the morning of surgery brush your teeth with toothpaste and water, you                 may rinse your mouth with mouthwash if you wish.  Do not swallow any              toothpaste of mouthwash.     _X__ 3.  No Alcohol for 24 hours before or after surgery.   _X__ 4.  Do Not Smoke or use e-cigarettes For 24 Hours Prior to Your Surgery.                 Do not use any chewable tobacco products for at least 6 hours prior to                 surgery.  ____  5.  Bring all medications with you on the day of surgery if instructed.   _X___  6.  Notify your doctor if there is any change in your medical condition      (cold, fever, infections).     Do not wear jewelry, make-up, hairpins, clips or nail polish. Do not wear lotions, powders, or perfumes. You may wear deodorant. Do not shave 48 hours prior to surgery. Men may shave face and neck. Do not bring valuables to the hospital.    [facility] is not responsible for any belongings or  valuables.  Contacts, dentures or bridgework may not be worn into surgery. Leave your suitcase in the car. After surgery it may be brought to your room. For patients admitted to the hospital, discharge time is determined by your treatment team.   Patients discharged the day of surgery will not be allowed to drive home.   Please read over the following fact sheets that you were given:   Surgical Site Infection Prevention    ____ Take these medicines the morning of surgery with A SIP OF WATER:    1.NONE  2.   3.   4.  5.  6.  ____ [employer]  Enema (as directed)   __X__ Use CHG Soap as directed  ____ Use inhalers on the day of surgery  ____ Stop metformin 2 days prior to surgery    ____ Take 1/2 of usual insulin dose the night before surgery. No insulin the morning          of surgery.   X____ Stop Coumadin/Plavix/aspirin on     ALREADY STOPPED  ____ Stop Anti-inflammatories on    _X___ Stop supplements until after surgery.   STOP FISH OIL UNTIL AFTER SURGERY  ____ Bring C-Pap to the hospital.

## 2017-09-01 NOTE — Telephone Encounter (Signed)
Spoke with Ginger Dr.Wohl's nurse regarding pathology on this patient. Dr.Wohl is referring patient to [facility] to have the ERCP done, due to worrisome for adenoma.  Please let Dr.Pabon know per Dr.Wohl as surgery may need to be rescheduled.  Ginger will let us know when the ERCP will be scheduled at [facility].

## 2017-09-01 NOTE — Patient Instructions (Signed)
Today you came in to follow-up on the ERCP.  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  This will need to be removed.  We will be sending you to [facility] to have this removed because there is no one in this area that does this type of procedure.

## 2017-09-01 NOTE — Progress Notes (Signed)
Primary Care Physician: Patient, No Pcp Per  Primary Gastroenterologist:  Dr. Lucilla Lame  Chief Complaint  Patient presents with  . Follow up procedure results    ERCP     HPI: Derek Figueroa is a 67 y.o. male here For follow-up after having an ERCP.  The patient had an ERCP with stone extraction.  The patient states he has been set up for a cholecystectomy.  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  The ampulla did not appear normal and was biopsied.  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  Current Outpatient Medications  Medication Sig Dispense Refill  . Alum Hydroxide-Mag Carbonate (GAVISCON PO) Take 1 tablet by mouth daily as needed (for stomach).    Marland Kitchen aspirin EC 81 MG tablet Take by mouth.    . Multiple Vitamins-Minerals (MULTIVITAMIN ADULT PO) Take 1 tablet by mouth daily.     . Omega-3 Fatty Acids (FISH OIL PO) Take 1 capsule by mouth daily.      No current facility-administered medications for this visit.     Allergies as of 09/01/2017  . (No Known Allergies)    ROS:  General: Negative for anorexia, weight loss, fever, chills, fatigue, weakness. ENT: Negative for hoarseness, difficulty swallowing , nasal congestion. CV: Negative for chest pain, angina, palpitations, dyspnea on exertion, peripheral edema.  Respiratory: Negative for dyspnea at rest, dyspnea on exertion, cough, sputum, wheezing.  GI: See history of present illness. GU:  Negative for dysuria, hematuria, urinary incontinence, urinary frequency, nocturnal urination.  Endo: Negative for unusual weight change.    Physical Examination:   BP 129/80   Pulse 80   Ht 5\' 8"  (1.727 m)   Wt 175 lb (79.4 kg)   BMI 26.61 kg/m   General: Well-nourished, well-developed in no acute distress.  Eyes: No icterus. Conjunctivae pink. Mouth: Oropharyngeal mucosa moist and pink , no lesions erythema or exudate. Lungs: Clear to  auscultation bilaterally. Non-labored. Heart: Regular rate and rhythm, no murmurs rubs or gallops.  Abdomen: Bowel sounds are normal, nontender, nondistended, no hepatosplenomegaly or masses, no abdominal bruits or hernia , no rebound or guarding.   Extremities: No lower extremity edema. No clubbing or deformities. Neuro: Alert and oriented x 3.  Grossly intact. Skin: Warm and dry, no jaundice.   Psych: Alert and cooperative, normal mood and affect.  Labs:    Imaging Studies: Ct Abdomen Pelvis W Contrast  Result Date: 08/05/2017 CLINICAL DATA:  Jaundice, biliary obstruction EXAM: CT ABDOMEN AND PELVIS WITH CONTRAST TECHNIQUE: Multidetector CT imaging of the abdomen and pelvis was performed using the standard protocol following bolus administration of intravenous contrast. CONTRAST:  168mL ISOVUE-300 IOPAMIDOL (ISOVUE-300) INJECTION 61% COMPARISON:  None. FINDINGS: Lower chest: Left base subsegmental atelectasis. No effusions. Heart is normal size. Eventration of the right hemidiaphragm anteriorly. Hepatobiliary: There is intrahepatic and extrahepatic biliary ductal dilatation. Common hepatic duct measures up to 9 mm. Cystic duct is prominent, measuring up to 13 mm. The common bile duct is decompressed and difficult to visualize. No visible ductal stones. Mild intrahepatic biliary ductal dilatation is well. Small scattered low-density lesions in the liver, likely cysts, the largest in the right hepatic lobe measuring 16 mm. Pancreas: No visible mass in the pancreatic head in the region of the common bile duct. No ductal dilatation within the pancreas. Spleen: Small low-density lesion in the tip of the spleen inferiorly measures 8 mm, likely small  cyst. Adrenals/Urinary Tract: No adrenal abnormality. No focal renal abnormality. No stones or hydronephrosis. Urinary bladder is unremarkable. Stomach/Bowel: Stomach, large and small bowel grossly unremarkable. Vascular/Lymphatic: Aortic atherosclerosis. No  enlarged abdominal or pelvic lymph nodes. Reproductive: Prostate enlargement. Other: No free fluid or free air. Bilateral inguinal hernias containing fat. Musculoskeletal: No acute bony abnormality. 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 Degenerative disc disease at L5-S1. IMPRESSION: There is mild extrahepatic and intrahepatic biliary ductal dilatation with prominence of the common bile duct and cystic duct. The common bile duct appears decompressed and difficult to visualize within the pancreatic head. I see no obstructing stone or mass. Cannot exclude a small intraductal mass near the confluence of the common hepatic duct and cystic duct. Consider further evaluation with MRCP or ERCP. Small scattered low-density lesions in the liver and spleen, likely small cysts. Prostate enlargement. Bilateral inguinal hernias containing fat. Aortic atherosclerosis. Electronically Signed   By: Rolm Baptise M.D.   On: 08/05/2017 18:20   Mr 3d Recon At Scanner  Result Date: 08/11/2017 CLINICAL DATA:  Jaundice, biliary dilatation on recent CT. EXAM: MRI ABDOMEN WITHOUT AND WITH CONTRAST (INCLUDING MRCP) TECHNIQUE: Multiplanar multisequence MR imaging of the abdomen was performed both before and after the administration of intravenous contrast. Heavily T2-weighted images of the biliary and pancreatic ducts were obtained, and three-dimensional MRCP images were rendered by post processing. CONTRAST:  41mL MULTIHANCE GADOBENATE DIMEGLUMINE 529 MG/ML IV SOLN COMPARISON:  08/05/2017 FINDINGS: Despite efforts by the technologist and patient, motion artifact is present on today's exam and could not be eliminated. This reduces exam sensitivity and specificity. Lower chest: Unremarkable Hepatobiliary: Multiple gallstones are present in the gallbladder. Although positive predictive value is adversely affected by motion artifact, the abruptly truncated appearance of the distal CBD  on image 9/10 and the suggestion of a 7 mm low signal intensity structure in the expected location of the CBD on image 19/5 strongly favor distal choledocholithiasis. The appearance on image 20/2 is also convincing for a CBD stone, possibly with a small amount of surrounding debris. There is mild intrahepatic and extrahepatic biliary dilatation. Several small nonenhancing hepatic lesions favor cysts, the largest measuring 1.6 by 1.3 cm in segment 8 of the liver. Pancreas:  Unremarkable Spleen: 7 mm nonenhancing lesion along the inferior splenic margin posteriorly, stable and likely a small cyst or similar benign lesion. Adrenals/Urinary Tract:  Unremarkable Stomach/Bowel: Unremarkable Vascular/Lymphatic:  Aortoiliac atherosclerotic vascular disease. Other:  No supplemental non-categorized findings. Musculoskeletal: Unremarkable IMPRESSION: 1. 7 mm distal CBD stone causing the biliary dilatation shown on today's exam. Multiple additional gallstones are present in the gallbladder. 2. Small hepatic cysts and a small lower splenic cyst, without appreciable enhancement. 3.  Aortic Atherosclerosis (ICD10-I70.0). Electronically Signed   By: Van Clines M.D.   On: 08/11/2017 10:45   Dg C-arm 1-60 Min-no Report  Result Date: 08/13/2017 Fluoroscopy was utilized by the requesting physician.  No radiographic interpretation.   Mr Abdomen Mrcp W Wo Contast  Result Date: 08/11/2017 CLINICAL DATA:  Jaundice, biliary dilatation on recent CT. EXAM: MRI ABDOMEN WITHOUT AND WITH CONTRAST (INCLUDING MRCP) TECHNIQUE: Multiplanar multisequence MR imaging of the abdomen was performed both before and after the administration of intravenous contrast. Heavily T2-weighted images of the biliary and pancreatic ducts were obtained, and three-dimensional MRCP images were rendered by post processing. CONTRAST:  20mL MULTIHANCE GADOBENATE DIMEGLUMINE 529 MG/ML IV SOLN COMPARISON:  08/05/2017 FINDINGS: Despite efforts by the  technologist and patient, motion artifact is present on today's exam and could not be eliminated. This reduces exam sensitivity and specificity. Lower chest: Unremarkable Hepatobiliary: Multiple gallstones are present in the gallbladder. Although positive predictive value is adversely affected by motion artifact, the abruptly truncated appearance of the distal CBD on image 9/10 and the suggestion of a 7 mm low signal intensity structure in the expected location of the CBD on image 19/5 strongly favor distal choledocholithiasis. The appearance on image 20/2 is also convincing for a CBD stone, possibly with a small amount of surrounding debris. There is mild intrahepatic and extrahepatic biliary dilatation. Several small nonenhancing hepatic lesions favor cysts, the largest measuring 1.6 by 1.3 cm in segment 8 of the liver. Pancreas:  Unremarkable Spleen: 7 mm nonenhancing lesion along the inferior splenic margin posteriorly, stable and likely a small cyst or similar benign lesion. Adrenals/Urinary Tract:  Unremarkable Stomach/Bowel: Unremarkable Vascular/Lymphatic:  Aortoiliac atherosclerotic vascular disease. Other:  No supplemental non-categorized findings. Musculoskeletal: Unremarkable IMPRESSION: 1. 7 mm distal CBD stone causing the biliary dilatation shown on today's exam. Multiple additional gallstones are present in the gallbladder. 2. Small hepatic cysts and a small lower splenic cyst, without appreciable enhancement. 3.  Aortic Atherosclerosis (ICD10-I70.0). Electronically Signed   By: Van Clines M.D.   On: 08/11/2017 10:45   US Abdomen Limited Ruq  Result Date: 08/05/2017 CLINICAL DATA:  Elevated liver enzymes EXAM: ULTRASOUND ABDOMEN LIMITED RIGHT UPPER QUADRANT COMPARISON:  None. FINDINGS: Gallbladder: Within the gallbladder, there are echogenic foci which move and shadow consistent with cholelithiasis. No gallbladder wall thickening or pericholecystic fluid. Largest gallstone measures 8 mm in  diameter. No sonographic Murphy sign noted by sonographer. Common bile duct: Diameter: 9 mm proximally with tapering distally to 7 mm. No biliary duct mass or calculus evident. No intrahepatic biliary duct dilatation evident. Liver: There is a cyst in the medial segment left lobe of the liver measuring 6 x 7 x 7 mm. There is a cyst in the anterior segment right lobe of the liver measuring 1.6 x 1.9 x 1.8 cm. Within normal limits in parenchymal echogenicity. Portal vein is patent on color Doppler imaging with normal direction of blood flow towards the liver. IMPRESSION: 1. Cholelithiasis. No gallbladder wall thickening or pericholecystic fluid. 2. Prominence of the common bile duct proximally with tapering distally. No biliary duct mass or calculus evident. 3.  Small cysts in liver.  Liver otherwise appears unremarkable. Electronically Signed   By: Lowella Grip III M.D.   On: 08/05/2017 13:26    Assessment and Plan:   Judea Riches is a 67 y.o. y/o male who comes in after having ERCP with biopsies of the ampulla that were suggestive of adenomatous changes.  The patient has been told these results and the need to have a ERCP with ampullectomy.  The patient has been given the choice of going to [facility] or [facility] and states he would rather go to [facility].  The patient will be referred to [facility] for this procedure.  The patient has been explained the plan and agrees with it.    Lucilla Lame, MD. Marval Regal   Note: This dictation was prepared with Dragon dictation along with smaller phrase technology. Any transcriptional errors that result from this process are unintentional.

## 2017-09-03 ENCOUNTER — Telehealth: Payer: Self-pay | Admitting: Gastroenterology

## 2017-09-03 NOTE — Telephone Encounter (Signed)
Spoke with Ginger regarding referral to [facility] - will call [facility] to check status and then let me know.

## 2017-09-03 NOTE — Telephone Encounter (Signed)
Pt left vm in regards to [facility] referral

## 2017-09-08 ENCOUNTER — Telehealth: Payer: Self-pay | Admitting: Surgery

## 2017-09-08 NOTE — Telephone Encounter (Signed)
Patient is aware that his surgery (Robot assisted laparoscopic cholecystectomy) has been canceled for 09/09/17 at this time, per the request of Dr Allen Norris and Dr Dahlia Byes. Patient will need to have the ERCP completed prior to surgery. Patient is scheduled to have this done at [facility] on 09/15/17. Once results have been reviewed by Dr Allen Norris and a decision has been made, we will then reschedule surgery at that time. Dr Dahlia Byes and [place] have been advised.

## 2017-09-09 ENCOUNTER — Ambulatory Visit: Admission: RE | Admit: 2017-09-09 | Source: Ambulatory Visit | Admitting: Surgery

## 2017-09-09 ENCOUNTER — Encounter: Admission: RE | Payer: Self-pay | Source: Ambulatory Visit

## 2017-09-09 SURGERY — ROBOTIC ASSISTED LAPAROSCOPIC CHOLECYSTECTOMY
Anesthesia: Choice

## 2017-09-24 ENCOUNTER — Encounter: Payer: Self-pay | Admitting: Gastroenterology

## 2017-10-15 ENCOUNTER — Encounter: Payer: Self-pay | Admitting: Surgery

## 2017-10-15 ENCOUNTER — Ambulatory Visit (INDEPENDENT_AMBULATORY_CARE_PROVIDER_SITE_OTHER): Admitting: Surgery

## 2017-10-15 VITALS — BP 148/89 | HR 61 | Temp 98.0°F | Ht 68.0 in | Wt 173.0 lb

## 2017-10-15 DIAGNOSIS — K8020 Calculus of gallbladder without cholecystitis without obstruction: Secondary | ICD-10-CM | POA: Diagnosis not present

## 2017-10-15 NOTE — Progress Notes (Signed)
Outpatient Surgical Follow Up  10/15/2017  Derek Figueroa is an 67 y.o. male.   Chief Complaint  Patient presents with  . Follow-up    HPI: Airway is a 67 year old well-known to [place] for initial biliary obstruction status post ERCP.  During the ERCP Dr.Wohl found some masses around the ampulla and he was referred to [facility] for excision.  This nodules/polyps were removed and they were consistent with low-grade dysplasia.  He should have a surveillance EGD in 2 years.  No further therapy indicated.  He is doing well no evidence of more biliary obstruction.  He is ready for a cholecystectomy.  Past Medical History:  Diagnosis Date  . Heart murmur     Past Surgical History:  Procedure Laterality Date  . ERCP N/A 08/13/2017   Procedure: ENDOSCOPIC RETROGRADE CHOLANGIOPANCREATOGRAPHY (ERCP);  Surgeon: Lucilla Lame, MD;  Location: [facility] ENDOSCOPY;  Service: Endoscopy;  Laterality: N/A;  . EYE SURGERY    . HERNIA REPAIR      Family History  Problem Relation Age of Onset  . Heart disease Mother   . Diabetes Mother   . Stroke Mother   . Heart disease Father   . COPD Father   . Lung cancer Brother   . COPD Brother   . Cancer Maternal Grandmother   . Cancer Maternal Grandfather     Social History:  reports that he quit smoking about 39 years ago. He has never used smokeless tobacco. He reports that he drinks about 1.8 oz of alcohol per week. He reports that he does not use drugs.  Allergies: No Known Allergies  Medications reviewed.    ROS Full ROS performed and is otherwise negative other than what is stated in HPI   BP (!) 148/89   Pulse 61   Temp 98 F (36.7 C)   Ht 5\' 8"  (1.727 m)   Wt 78.5 kg (173 lb)   BMI 26.30 kg/m   Physical Exam  Constitutional: He is oriented to person, place, and time. He appears well-developed and well-nourished. No distress.  Eyes: Right eye exhibits no discharge. Left eye exhibits no discharge. No scleral icterus.  Neck: Normal range of  motion. Neck supple.  Cardiovascular: Normal rate and regular rhythm.  Abdominal: Soft. He exhibits no distension and no mass. There is no tenderness. There is no rebound and no guarding.  Neurological: He is alert and oriented to person, place, and time.  Skin: Skin is warm and dry. Capillary refill takes less than 2 seconds. He is not diaphoretic.  Psychiatric: He has a normal mood and affect. His behavior is normal. Judgment and thought content normal.  Nursing note and vitals reviewed.       Assessment/Plan: She with symptomatic cholelithiasis in need for cholecystectomy.  Discussed with the patient in detail about the surgery. The risks, benefits, complications, treatment options, and expected outcomes were discussed with the patient. The possibilities of bleeding, recurrent infection, finding a normal gallbladder, perforation of viscus organs, damage to surrounding structures, bile leak, abscess formation, needing a drain placed, the need for additional procedures, reaction to medication, pulmonary aspiration,  failure to diagnose a condition, the possible need to convert to an open procedure, and creating a complication requiring transfusion or operation were discussed with the patient. The patient and/or family concurred with the proposed plan, giving informed consent.  We will plan to perform a robotic assisted laparoscopic cholecystectomy possible open. Questions were answered  Caroleen Hamman, MD [facility] General Surgeon

## 2017-10-15 NOTE — Addendum Note (Signed)
Addended by: Caroleen Hamman F on: 10/15/2017 02:21 PM   Modules accepted: Orders, SmartSet

## 2017-10-15 NOTE — Patient Instructions (Signed)
Your gallbladder surgery is scheduled for Tuesday May 28th at [facility] with Dr Caroleen Hamman. Please refer to your [facility] surgery sheet for instructions. We will call you about pre admit testing prior.

## 2017-10-15 NOTE — H&P (View-Only) (Signed)
Outpatient Surgical Follow Up  10/15/2017  Zak Gondek is an 67 y.o. male.   Chief Complaint  Patient presents with  . Follow-up    HPI: Airway is a 67 year old well-known to [place] for initial biliary obstruction status post ERCP.  During the ERCP Dr.Wohl found some masses around the ampulla and he was referred to [facility] for excision.  This nodules/polyps were removed and they were consistent with low-grade dysplasia.  He should have a surveillance EGD in 2 years.  No further therapy indicated.  He is doing well no evidence of more biliary obstruction.  He is ready for a cholecystectomy.  Past Medical History:  Diagnosis Date  . Heart murmur     Past Surgical History:  Procedure Laterality Date  . ERCP N/A 08/13/2017   Procedure: ENDOSCOPIC RETROGRADE CHOLANGIOPANCREATOGRAPHY (ERCP);  Surgeon: Lucilla Lame, MD;  Location: [facility] ENDOSCOPY;  Service: Endoscopy;  Laterality: N/A;  . EYE SURGERY    . HERNIA REPAIR      Family History  Problem Relation Age of Onset  . Heart disease Mother   . Diabetes Mother   . Stroke Mother   . Heart disease Father   . COPD Father   . Lung cancer Brother   . COPD Brother   . Cancer Maternal Grandmother   . Cancer Maternal Grandfather     Social History:  reports that he quit smoking about 39 years ago. He has never used smokeless tobacco. He reports that he drinks about 1.8 oz of alcohol per week. He reports that he does not use drugs.  Allergies: No Known Allergies  Medications reviewed.    ROS Full ROS performed and is otherwise negative other than what is stated in HPI   BP (!) 148/89   Pulse 61   Temp 98 F (36.7 C)   Ht 5\' 8"  (1.727 m)   Wt 78.5 kg (173 lb)   BMI 26.30 kg/m   Physical Exam  Constitutional: He is oriented to person, place, and time. He appears well-developed and well-nourished. No distress.  Eyes: Right eye exhibits no discharge. Left eye exhibits no discharge. No scleral icterus.  Neck: Normal range of  motion. Neck supple.  Cardiovascular: Normal rate and regular rhythm.  Abdominal: Soft. He exhibits no distension and no mass. There is no tenderness. There is no rebound and no guarding.  Neurological: He is alert and oriented to person, place, and time.  Skin: Skin is warm and dry. Capillary refill takes less than 2 seconds. He is not diaphoretic.  Psychiatric: He has a normal mood and affect. His behavior is normal. Judgment and thought content normal.  Nursing note and vitals reviewed.       Assessment/Plan: She with symptomatic cholelithiasis in need for cholecystectomy.  Discussed with the patient in detail about the surgery. The risks, benefits, complications, treatment options, and expected outcomes were discussed with the patient. The possibilities of bleeding, recurrent infection, finding a normal gallbladder, perforation of viscus organs, damage to surrounding structures, bile leak, abscess formation, needing a drain placed, the need for additional procedures, reaction to medication, pulmonary aspiration,  failure to diagnose a condition, the possible need to convert to an open procedure, and creating a complication requiring transfusion or operation were discussed with the patient. The patient and/or family concurred with the proposed plan, giving informed consent.  We will plan to perform a robotic assisted laparoscopic cholecystectomy possible open. Questions were answered  Caroleen Hamman, MD [facility] General Surgeon

## 2017-10-16 ENCOUNTER — Encounter
Admission: RE | Admit: 2017-10-16 | Discharge: 2017-10-16 | Disposition: A | Discharge status: Home / Self Care | Source: Ambulatory Visit | Attending: Surgery | Admitting: Surgery

## 2017-10-16 ENCOUNTER — Other Ambulatory Visit: Payer: Self-pay

## 2017-10-16 NOTE — Patient Instructions (Signed)
Your procedure is scheduled on: 10-20-17 Report to Same Day Surgery 2nd floor medical mall [facility] En[facility]-take elevator on left to 2nd floor.  Check in with surgery information desk.) To find out your arrival time please call [phone] between 1PM - 3PM on 10-16-17  Remember: Instructions that are not followed completely may result in serious medical risk, up to and including death, or upon the discretion of your surgeon and anesthesiologist your surgery may need to be rescheduled.    _x___ 1. Do not eat food after midnight the night before your procedure. NO GUM OR CANDY AFTER MIDNIGHT.  You may drink clear liquids up to 2 hours before you are scheduled to arrive at the hospital for your procedure.  Do not drink clear liquids within 2 hours of your scheduled arrival to the hospital.  Clear liquids include  --Water or Apple juice without pulp  --Clear carbohydrate beverage such as ClearFast or Gatorade  --Black Coffee or Clear Tea (No milk, no creamers, do not add anything to the coffee or Tea    __x__ 2. No Alcohol for 24 hours before or after surgery.   __x__3. No Smoking or e-cigarettes for 24 prior to surgery.  Do not use any chewable tobacco products for at least 6 hour prior to surgery   ____  4. Bring all medications with you on the day of surgery if instructed.    __x__ 5. Notify your doctor if there is any change in your medical condition     (cold, fever, infections).    x___6. On the morning of surgery brush your teeth with toothpaste and water.  You may rinse your mouth with mouth wash if you wish.  Do not swallow any toothpaste or mouthwash.   Do not wear jewelry, make-up, hairpins, clips or nail polish.  Do not wear lotions, powders, or perfumes. You may wear deodorant.  Do not shave 48 hours prior to surgery. Men may shave face and neck.  Do not bring valuables to the hospital.    [facility] is not responsible for any belongings or valuables.    Contacts, dentures or bridgework may not be worn into surgery.  Leave your suitcase in the car. After surgery it may be brought to your room.  For patients admitted to the hospital, discharge time is determined by your treatment team.  _  Patients discharged the day of surgery will not be allowed to drive home.  You will need someone to drive you home and stay with you the night of your procedure.   ____ TAKE THE FOLLOWING MEDICATION THE MORNING OF SURGERY . These include:  1. NONE  2.  3.  4.  5.  6.  ____Fleets enema or Magnesium Citrate as directed.   ____ Use CHG Soap or sage wipes as directed on instruction sheet   ____ Use inhalers on the day of surgery and bring to hospital day of surgery  ____ Stop Metformin and Janumet 2 days prior to surgery.    ____ Take 1/2 of usual insulin dose the night before surgery and none on the morning surgery.   ____ Follow recommendations from Cardiologist, Pulmonologist or PCP regarding stopping Aspirin, Coumadin, Plavix ,Eliquis, Effient, or Pradaxa, and Pletal.  X____Stop Anti-inflammatories such as Advil, Aleve, Ibuprofen, Motrin, Naproxen, Naprosyn, Goodies powders or aspirin products NOW-OK to take Tylenol    _x___ Stop supplements until after surgery-STOP FISH OIL NOW-MAY RESUME AFTER SURGERY   ____ Bring C-Pap to the hospital.

## 2017-10-19 MED ORDER — CEFAZOLIN SODIUM-DEXTROSE 2-4 GM/100ML-% IV SOLN
2.0000 g | INTRAVENOUS | Status: AC
  Administered 2017-10-20: 2 g via INTRAVENOUS

## 2017-10-20 ENCOUNTER — Ambulatory Visit: Admitting: Certified Registered"

## 2017-10-20 ENCOUNTER — Other Ambulatory Visit: Payer: Self-pay

## 2017-10-20 ENCOUNTER — Encounter: Payer: Self-pay | Admitting: Anesthesiology

## 2017-10-20 ENCOUNTER — Encounter
Admission: RE | Disposition: A | Discharge status: Home / Self Care | Payer: Self-pay | Source: Ambulatory Visit | Attending: Surgery

## 2017-10-20 ENCOUNTER — Ambulatory Visit
Admission: RE | Admit: 2017-10-20 | Discharge: 2017-10-20 | Disposition: A | Discharge status: Home / Self Care | Source: Ambulatory Visit | Attending: Surgery | Admitting: Surgery

## 2017-10-20 DIAGNOSIS — K429 Umbilical hernia without obstruction or gangrene: Secondary | ICD-10-CM | POA: Insufficient documentation

## 2017-10-20 DIAGNOSIS — K219 Gastro-esophageal reflux disease without esophagitis: Secondary | ICD-10-CM | POA: Diagnosis not present

## 2017-10-20 DIAGNOSIS — Z8249 Family history of ischemic heart disease and other diseases of the circulatory system: Secondary | ICD-10-CM | POA: Insufficient documentation

## 2017-10-20 DIAGNOSIS — R011 Cardiac murmur, unspecified: Secondary | ICD-10-CM | POA: Insufficient documentation

## 2017-10-20 DIAGNOSIS — Z87891 Personal history of nicotine dependence: Secondary | ICD-10-CM | POA: Insufficient documentation

## 2017-10-20 DIAGNOSIS — K8010 Calculus of gallbladder with chronic cholecystitis without obstruction: Secondary | ICD-10-CM | POA: Insufficient documentation

## 2017-10-20 DIAGNOSIS — K8020 Calculus of gallbladder without cholecystitis without obstruction: Secondary | ICD-10-CM | POA: Diagnosis present

## 2017-10-20 DIAGNOSIS — K810 Acute cholecystitis: Secondary | ICD-10-CM | POA: Diagnosis not present

## 2017-10-20 HISTORY — PX: UMBILICAL HERNIA REPAIR: SHX196

## 2017-10-20 HISTORY — PX: ROBOTIC ASSISTED LAPAROSCOPIC CHOLECYSTECTOMY-MULTI SITE: SHX6603

## 2017-10-20 SURGERY — ROBOTIC ASSISTED LAPAROSCOPIC CHOLECYSTECTOMY-MULTI SITE
Anesthesia: General | Site: Abdomen | Wound class: Contaminated

## 2017-10-20 MED ORDER — CHLORHEXIDINE GLUCONATE CLOTH 2 % EX PADS: 6.0000 | MEDICATED_PAD | Freq: Once | CUTANEOUS | Status: DC

## 2017-10-20 MED ORDER — ACETAMINOPHEN 500 MG PO TABS
ORAL_TABLET | ORAL | Status: AC
  Administered 2017-10-20: 1000 mg via ORAL
  Filled 2017-10-20: qty 2

## 2017-10-20 MED ORDER — BUPIVACAINE HCL 0.25 % IJ SOLN
INTRAMUSCULAR | Status: DC | PRN
  Administered 2017-10-20: 30 mL

## 2017-10-20 MED ORDER — PROPOFOL 10 MG/ML IV BOLUS
INTRAVENOUS | Status: DC | PRN
  Administered 2017-10-20: 200 mg via INTRAVENOUS

## 2017-10-20 MED ORDER — ROCURONIUM BROMIDE 100 MG/10ML IV SOLN
INTRAVENOUS | Status: DC | PRN
  Administered 2017-10-20 (×2): 10 mg via INTRAVENOUS
  Administered 2017-10-20: 50 mg via INTRAVENOUS

## 2017-10-20 MED ORDER — MIDAZOLAM HCL 2 MG/2ML IJ SOLN
INTRAMUSCULAR | Status: DC | PRN
  Administered 2017-10-20: 2 mg via INTRAVENOUS

## 2017-10-20 MED ORDER — ONDANSETRON HCL 4 MG/2ML IJ SOLN: 4.0000 mg | Freq: Once | INTRAMUSCULAR | Status: DC | PRN

## 2017-10-20 MED ORDER — CELECOXIB 200 MG PO CAPS
ORAL_CAPSULE | ORAL | Status: AC
  Filled 2017-10-20: qty 1

## 2017-10-20 MED ORDER — ACETAMINOPHEN 500 MG PO TABS
1000.0000 mg | ORAL_TABLET | ORAL | Status: AC
  Administered 2017-10-20: 1000 mg via ORAL

## 2017-10-20 MED ORDER — FAMOTIDINE 20 MG PO TABS
20.0000 mg | ORAL_TABLET | Freq: Once | ORAL | Status: AC
  Administered 2017-10-20: 20 mg via ORAL

## 2017-10-20 MED ORDER — FENTANYL CITRATE (PF) 250 MCG/5ML IJ SOLN
INTRAMUSCULAR | Status: AC
  Filled 2017-10-20: qty 5

## 2017-10-20 MED ORDER — MIDAZOLAM HCL 2 MG/2ML IJ SOLN
INTRAMUSCULAR | Status: AC
  Filled 2017-10-20: qty 2

## 2017-10-20 MED ORDER — SUGAMMADEX SODIUM 200 MG/2ML IV SOLN
INTRAVENOUS | Status: AC
  Filled 2017-10-20: qty 2

## 2017-10-20 MED ORDER — LACTATED RINGERS IV SOLN
INTRAVENOUS | Status: DC
  Administered 2017-10-20: 10:00:00 via INTRAVENOUS

## 2017-10-20 MED ORDER — BUPIVACAINE LIPOSOME 1.3 % IJ SUSP
20.0000 mL | Freq: Once | INTRAMUSCULAR | Status: DC
  Filled 2017-10-20: qty 20

## 2017-10-20 MED ORDER — LIDOCAINE HCL (CARDIAC) PF 100 MG/5ML IV SOSY
PREFILLED_SYRINGE | INTRAVENOUS | Status: DC | PRN
  Administered 2017-10-20: 60 mg via INTRAVENOUS

## 2017-10-20 MED ORDER — CEFAZOLIN SODIUM-DEXTROSE 2-4 GM/100ML-% IV SOLN
INTRAVENOUS | Status: AC
  Filled 2017-10-20: qty 100

## 2017-10-20 MED ORDER — EPHEDRINE SULFATE 50 MG/ML IJ SOLN
INTRAMUSCULAR | Status: DC | PRN
  Administered 2017-10-20: 10 mg via INTRAVENOUS
  Administered 2017-10-20: 5 mg via INTRAVENOUS

## 2017-10-20 MED ORDER — INDOCYANINE GREEN 25 MG IV SOLR
INTRAVENOUS | Status: AC
  Filled 2017-10-20: qty 25

## 2017-10-20 MED ORDER — DEXAMETHASONE SODIUM PHOSPHATE 10 MG/ML IJ SOLN
INTRAMUSCULAR | Status: DC | PRN
  Administered 2017-10-20: 10 mg via INTRAVENOUS

## 2017-10-20 MED ORDER — CELECOXIB 200 MG PO CAPS
200.0000 mg | ORAL_CAPSULE | ORAL | Status: AC
  Administered 2017-10-20: 200 mg via ORAL

## 2017-10-20 MED ORDER — HYDROCODONE-ACETAMINOPHEN 5-325 MG PO TABS: 1.0000 | ORAL_TABLET | ORAL | 0 refills | Status: DC | PRN

## 2017-10-20 MED ORDER — ONDANSETRON HCL 4 MG/2ML IJ SOLN
INTRAMUSCULAR | Status: DC | PRN
  Administered 2017-10-20: 4 mg via INTRAVENOUS

## 2017-10-20 MED ORDER — PROPOFOL 10 MG/ML IV BOLUS
INTRAVENOUS | Status: AC
  Filled 2017-10-20: qty 20

## 2017-10-20 MED ORDER — GABAPENTIN 300 MG PO CAPS
ORAL_CAPSULE | ORAL | Status: AC
  Administered 2017-10-20: 300 mg via ORAL
  Filled 2017-10-20: qty 1

## 2017-10-20 MED ORDER — PHENYLEPHRINE HCL 10 MG/ML IJ SOLN
INTRAMUSCULAR | Status: DC | PRN
  Administered 2017-10-20: 100 ug via INTRAVENOUS

## 2017-10-20 MED ORDER — FAMOTIDINE 20 MG PO TABS
ORAL_TABLET | ORAL | Status: AC
  Administered 2017-10-20: 20 mg via ORAL
  Filled 2017-10-20: qty 1

## 2017-10-20 MED ORDER — BUPIVACAINE HCL (PF) 0.25 % IJ SOLN
INTRAMUSCULAR | Status: AC
  Filled 2017-10-20: qty 30

## 2017-10-20 MED ORDER — FENTANYL CITRATE (PF) 100 MCG/2ML IJ SOLN
INTRAMUSCULAR | Status: DC | PRN
  Administered 2017-10-20: 100 ug via INTRAVENOUS
  Administered 2017-10-20 (×3): 50 ug via INTRAVENOUS

## 2017-10-20 MED ORDER — KETOROLAC TROMETHAMINE 30 MG/ML IJ SOLN
INTRAMUSCULAR | Status: DC | PRN
  Administered 2017-10-20: 30 mg via INTRAVENOUS

## 2017-10-20 MED ORDER — SUGAMMADEX SODIUM 200 MG/2ML IV SOLN
INTRAVENOUS | Status: DC | PRN
  Administered 2017-10-20: 160 mg via INTRAVENOUS

## 2017-10-20 MED ORDER — GABAPENTIN 300 MG PO CAPS
300.0000 mg | ORAL_CAPSULE | ORAL | Status: AC
  Administered 2017-10-20: 300 mg via ORAL

## 2017-10-20 MED ORDER — FENTANYL CITRATE (PF) 100 MCG/2ML IJ SOLN: 25.0000 ug | INTRAMUSCULAR | Status: DC | PRN

## 2017-10-20 MED ORDER — KETOROLAC TROMETHAMINE 30 MG/ML IJ SOLN
INTRAMUSCULAR | Status: AC
  Filled 2017-10-20: qty 1

## 2017-10-20 MED ORDER — INDOCYANINE GREEN 25 MG IV SOLR
2.5000 mg | Freq: Once | INTRAVENOUS | Status: AC
  Administered 2017-10-20: 2.5 mg via INTRAVENOUS
  Filled 2017-10-20: qty 25

## 2017-10-20 SURGICAL SUPPLY — 44 items
CANISTER SUCT 1200ML W/VALVE (MISCELLANEOUS) ×3 IMPLANT
CHLORAPREP W/TINT 26ML (MISCELLANEOUS) ×3 IMPLANT
CLIP VESOLOCK MED LG 6/CT (CLIP) ×6 IMPLANT
CORD BIP STRL DISP 12FT (MISCELLANEOUS) ×3 IMPLANT
COVER TIP SHEARS 8 DVNC (MISCELLANEOUS) IMPLANT
COVER TIP SHEARS 8MM DA VINCI (MISCELLANEOUS)
DECANTER SPIKE VIAL GLASS SM (MISCELLANEOUS) ×3 IMPLANT
DEFOGGER SCOPE WARMER CLEARIFY (MISCELLANEOUS) ×3 IMPLANT
DERMABOND ADVANCED (GAUZE/BANDAGES/DRESSINGS) ×2
DERMABOND ADVANCED .7 DNX12 (GAUZE/BANDAGES/DRESSINGS) ×1 IMPLANT
DRAPE 3 ARM ACCESS DA VINCI (DRAPES) ×2
DRAPE 3 ARM ACCESS DVNC (DRAPES) ×1 IMPLANT
DRAPE SHEET LG 3/4 BI-LAMINATE (DRAPES) ×3 IMPLANT
DRSG TEGADERM 2-3/8X2-3/4 SM (GAUZE/BANDAGES/DRESSINGS) IMPLANT
DRSG TELFA 3X8 NADH (GAUZE/BANDAGES/DRESSINGS) IMPLANT
ELECT REM PT RETURN 9FT ADLT (ELECTROSURGICAL) ×3
ELECTRODE REM PT RTRN 9FT ADLT (ELECTROSURGICAL) ×1 IMPLANT
GLOVE BIO SURGEON STRL SZ7 (GLOVE) ×12 IMPLANT
GLOVE INDICATOR 7.5 STRL GRN (GLOVE) IMPLANT
GOWN STRL REUS W/ TWL LRG LVL3 (GOWN DISPOSABLE) ×4 IMPLANT
GOWN STRL REUS W/TWL LRG LVL3 (GOWN DISPOSABLE) ×8
IRRIGATION STRYKERFLOW (MISCELLANEOUS) ×1 IMPLANT
IRRIGATOR STRYKERFLOW (MISCELLANEOUS) ×3
IV NS 1000ML (IV SOLUTION) ×2
IV NS 1000ML BAXH (IV SOLUTION) ×1 IMPLANT
KIT PINK PAD W/HEAD ARE REST (MISCELLANEOUS) ×3
KIT PINK PAD W/HEAD ARM REST (MISCELLANEOUS) ×1 IMPLANT
LABEL OR SOLS (LABEL) ×3 IMPLANT
NEEDLE HYPO 22GX1.5 SAFETY (NEEDLE) ×3 IMPLANT
NS IRRIG 500ML POUR BTL (IV SOLUTION) ×3 IMPLANT
PACK LAP CHOLECYSTECTOMY (MISCELLANEOUS) ×3 IMPLANT
PENCIL ELECTRO HAND CTR (MISCELLANEOUS) ×3 IMPLANT
POUCH SPECIMEN RETRIEVAL 10MM (ENDOMECHANICALS) ×3 IMPLANT
PROGRASP ENDOWRIST DA VINCI (INSTRUMENTS)
PROGRASP ENDOWRIST DVNC (INSTRUMENTS) IMPLANT
SOLUTION ELECTROLUBE (MISCELLANEOUS) ×3 IMPLANT
STRAP SAFETY 5IN WIDE (MISCELLANEOUS) ×3 IMPLANT
SUT ETHIBOND 0 (SUTURE) ×6 IMPLANT
SUT ETHIBOND 0 MO6 C/R (SUTURE) IMPLANT
SUT MNCRL AB 4-0 PS2 18 (SUTURE) ×3 IMPLANT
SUT VICRYL 0 AB UR-6 (SUTURE) ×3 IMPLANT
TROCAR 130MM GELPORT  DAV (MISCELLANEOUS) ×3 IMPLANT
TROCAR XCEL NON-BLD 5MMX100MML (ENDOMECHANICALS) ×3 IMPLANT
TUBING INSUF HEATED (TUBING) ×3 IMPLANT

## 2017-10-20 NOTE — Transfer of Care (Signed)
Immediate Anesthesia Transfer of Care Note  Patient: Maximilien Hayashi  Procedure(s) Performed: ROBOTIC ASSISTED LAPAROSCOPIC CHOLECYSTECTOMY-MULTI SITE (N/A Abdomen) HERNIA REPAIR UMBILICAL ADULT (N/A Abdomen)  Patient Location: PACU  Anesthesia Type:General  Level of Consciousness: sedated  Airway & Oxygen Therapy: Patient Spontanous Breathing and Patient connected to face mask oxygen  Post-op Assessment: Report given to RN and Post -op Vital signs reviewed and stable  Post vital signs: Reviewed and stable  Last Vitals:  Vitals Value Taken Time  BP 125/75 10/20/2017 12:11 PM  Temp 36.5 C 10/20/2017 12:11 PM  Pulse 79 10/20/2017 12:12 PM  Resp 19 10/20/2017 12:12 PM  SpO2 95 % 10/20/2017 12:12 PM  Vitals shown include unvalidated device data.  Last Pain:  Vitals:   10/20/17 1211  TempSrc: Tympanic  PainSc:          Complications: No apparent anesthesia complications

## 2017-10-20 NOTE — Anesthesia Postprocedure Evaluation (Signed)
Anesthesia Post Note  Patient: Derek Figueroa  Procedure(s) Performed: ROBOTIC ASSISTED LAPAROSCOPIC CHOLECYSTECTOMY-MULTI SITE (N/A Abdomen) HERNIA REPAIR UMBILICAL ADULT (N/A Abdomen)  Patient location during evaluation: PACU Anesthesia Type: General Level of consciousness: awake and alert and oriented Pain management: pain level controlled Vital Signs Assessment: post-procedure vital signs reviewed and stable Respiratory status: spontaneous breathing Cardiovascular status: blood pressure returned to baseline Anesthetic complications: no     Last Vitals:  Vitals:   10/20/17 1257 10/20/17 1331  BP: 118/75 119/68  Pulse: 67 66  Resp: 16 18  Temp: (!) 36.3 C (!) 36.1 C  SpO2: 100% 99%    Last Pain:  Vitals:   10/20/17 1331  TempSrc: Tympanic  PainSc: 0-No pain                 Mosetta Ferdinand

## 2017-10-20 NOTE — Anesthesia Procedure Notes (Signed)
Procedure Name: Intubation Date/Time: 10/20/2017 10:28 AM Performed by: Rolla Plate, CRNA Pre-anesthesia Checklist: Patient identified, Emergency Drugs available, Suction available, Patient being monitored and Timeout performed Patient Re-evaluated:Patient Re-evaluated prior to induction Oxygen Delivery Method: Circle system utilized Preoxygenation: Pre-oxygenation with 100% oxygen Induction Type: IV induction Ventilation: Mask ventilation without difficulty Laryngoscope Size: Mac and 4 Grade View: Grade I Tube type: Oral Tube size: 7.5 mm Number of attempts: 1 Airway Equipment and Method: Stylet Placement Confirmation: ETT inserted through vocal cords under direct vision,  positive ETCO2 and breath sounds checked- equal and bilateral Secured at: 23 cm Tube secured with: Tape Dental Injury: Teeth and Oropharynx as per pre-operative assessment

## 2017-10-20 NOTE — Anesthesia Preprocedure Evaluation (Signed)
Anesthesia Evaluation  Patient identified by MRN, date of birth, ID band Patient awake    Reviewed: Allergy & Precautions, H&P , NPO status , Patient's Chart, lab work & pertinent test results, reviewed documented beta blocker date and time   Airway Mallampati: II  TM Distance: >3 FB     Dental  (+) Chipped Irregular alignment of teeth:   Pulmonary former smoker,    Pulmonary exam normal        Cardiovascular negative cardio ROS Normal cardiovascular exam+ Valvular Problems/Murmurs      Neuro/Psych negative neurological ROS  negative psych ROS   GI/Hepatic Neg liver ROS, GERD  Controlled,  Endo/Other  negative endocrine ROS  Renal/GU negative Renal ROS  negative genitourinary   Musculoskeletal negative musculoskeletal ROS (+)   Abdominal   Peds negative pediatric ROS (+)  Hematology negative hematology ROS (+)   Anesthesia Other Findings Jaundiced 2 to stone blocking duct  Reproductive/Obstetrics                             Anesthesia Physical  Anesthesia Plan  ASA: II  Anesthesia Plan: General   Post-op Pain Management:    Induction: Intravenous  PONV Risk Score and Plan: 2 and Propofol infusion  Airway Management Planned: Oral ETT  Additional Equipment:   Intra-op Plan:   Post-operative Plan: Extubation in OR  Informed Consent: I have reviewed the patients History and Physical, chart, labs and discussed the procedure including the risks, benefits and alternatives for the proposed anesthesia with the patient or authorized representative who has indicated his/her understanding and acceptance.   Dental Advisory Given  Plan Discussed with: CRNA  Anesthesia Plan Comments:         Anesthesia Quick Evaluation

## 2017-10-20 NOTE — Op Note (Signed)
Robotic Assisted Laparoscopic Cholecystectomy and Open Repair of umbilical hernia  Pre-operative Diagnosis:  Cholelithiasis with hx CBD stones Umbilical hernia  Post-operative Diagnosis: same   Surgeon: Caroleen Hamman, [name]  Anesthesia: Gen. with endotracheal tube  Findings:  Chronic cholecystitis No evidence of bile leak or injury w ICG cholangiography   Estimated Blood Loss:  10cc                Specimens: Gallbladder           Complications: none   Procedure Details  The patient was seen again in the Holding Room. The benefits, complications, treatment options, and expected outcomes were discussed with the patient. The risks of bleeding, infection, recurrence of symptoms, failure to resolve symptoms, bile duct damage, bile duct leak, retained common bile duct stone, bowel injury, any of which could require further surgery and/or ERCP, stent, or papillotomy were reviewed with the patient. The likelihood of improving the patient's symptoms with return to their baseline status is good.  The patient and/or family concurred with the proposed plan, giving informed consent.  The patient was taken to Operating Room, identified as Derek Figueroa and the procedure verified as Laparoscopic Cholecystectomy.  A Time Out was held and the above information confirmed.  Prior to the induction of general anesthesia, antibiotic prophylaxis was administered. VTE prophylaxis was in place. General endotracheal anesthesia was then administered and tolerated well. After the induction, the abdomen was prepped with Chloraprep and draped in the sterile fashion. The patient was positioned in the supine position.  Cut down technique was used to enter the abdominal cavity and a Hasson trochar was placed after two vicryl stitches were anchored to the fascia. We did find a hernia defect and the sac was excised. We used the defect to place our trochar. Pneumoperitoneum was then created with CO2 and  tolerated well without any adverse changes in the patient's vital signs.  Two 8-mm and one 5 mm porwere placed under direct vision. All skin incisions  were infiltrated with a local anesthetic agent before making the incision and placing the trocars.   The patient was positioned  in reverse Trendelenburg, tilted slightly to the patient's left.  The gallbladder was identified, the fundus grasped and retracted cephalad. The GB was aspirated and mucus removed. The robot was brought to the field and docked in the standard fashion. We made sure the working instruments were in direct vision at all times.  I scrubbed out and went to the console  Adhesions were lysed bluntly. The infundibulum was grasped and retracted laterally, exposing the peritoneum overlying the triangle of Calot. This was then divided and exposed in a blunt fashion. An extended critical view of the cystic duct and cystic artery was obtained.  The cystic duct was clearly identified and bluntly dissected.   Artery and duct were double clipped and divided.  The gallbladder was taken from the gallbladder fossa in a retrograde fashion with the electrocautery. The gallbladder was removed and placed in an Endocatch bag. The liver bed was irrigated and inspected. Hemostasis was achieved with the electrocautery..  The gallbladder and Endocatch sac were then removed through a port site.   Inspection of the right upper quadrant was performed. No bleeding, bile duct injury or leak, or bowel injury was noted. The robot was undocked in the standard fashion. Pneumoperitoneum was released.  The umbilical defect was closed with interrupted 0 ethibons sutures. 4-0 subcuticular Monocryl was used to close the skin. Dermabond was  applied.  The patient was then extubated and brought to the recovery room in stable condition. Sponge, lap, and needle counts were correct at closure and at the conclusion of the case.               Caroleen Hamman, MD, FACS

## 2017-10-20 NOTE — Interval H&P Note (Signed)
History and Physical Interval Note:  10/20/2017 9:38 AM  Derek Figueroa  has presented today for surgery, with the diagnosis of BILIARY COLIC  The various methods of treatment have been discussed with the patient and family. After consideration of risks, benefits and other options for treatment, the patient has consented to  Procedure(s): ROBOTIC [place] (N/A) as a surgical intervention .  The patient's history has been reviewed, patient examined, no change in status, stable for surgery.  I have reviewed the patient's chart and labs.  Questions were answered to the patient's satisfaction.     Mitchell

## 2017-10-20 NOTE — Anesthesia Post-op Follow-up Note (Signed)
Anesthesia QCDR form completed.        

## 2017-10-20 NOTE — Discharge Instructions (Addendum)
Laparoscopic Cholecystectomy, Care After  ° °These instructions give you information on caring for yourself after your procedure. Your doctor may also give you more specific instructions. Call your doctor if you have any problems or questions after your procedure.  °HOME CARE  °Change your bandages (dressings) as told by your doctor.  °Keep the wound dry and clean. Wash the wound gently with soap and water. Pat the wound dry with a clean towel.  °Do not take baths, swim, or use hot tubs for 2 weeks, or as told by your doctor.  °Only take medicine as told by your doctor.  °Eat a normal diet as told by your doctor.  °Do not lift anything heavier than 10 pounds (4.5 kg) until your doctor says it is okay.  °Do not play contact sports for 1 week, or as told by your doctor. °GET HELP IF:  °Your wound is red, puffy (swollen), or painful.  °You have yellowish-white fluid (pus) coming from the wound.  °You have fluid draining from the wound for more than 1 day.  °You have a bad smell coming from the wound.  °Your wound breaks open. °GET HELP RIGHT AWAY IF:  °You have trouble breathing.  °You have chest pain.  °You have a fever >101  °You have pain in the shoulders (shoulder strap areas) that is getting worse.  °You feel dizzy or pass out (faint).  °You have severe belly (abdominal) pain.  °You feel sick to your stomach (nauseous) or throw up (vomit) for more than 1 day. ° ° ° °AMBULATORY SURGERY  °DISCHARGE INSTRUCTIONS ° ° °1) The drugs that you were given will stay in your system until tomorrow so for the next 24 hours you should not: ° °A) Drive an automobile °B) Make any legal decisions °C) Drink any alcoholic beverage ° ° °2) You may resume regular meals tomorrow.  Today it is better to start with liquids and gradually work up to solid foods. ° °You may eat anything you prefer, but it is better to start with liquids, then soup and crackers, and gradually work up to solid foods. ° ° °3) Please notify your doctor  immediately if you have any unusual bleeding, trouble breathing, redness and pain at the surgery site, drainage, fever, or pain not relieved by medication. ° ° ° °4) Additional Instructions: ° ° ° ° ° ° ° °Please contact your physician with any problems or Same Day Surgery at [phone], Monday through Friday 6 am to 4 pm, or [facility] Main number at [phone]. ° ° °

## 2017-10-21 ENCOUNTER — Encounter: Payer: Self-pay | Admitting: Gastroenterology

## 2017-10-21 ENCOUNTER — Encounter: Payer: Self-pay | Admitting: Surgery

## 2017-10-21 LAB — SURGICAL PATHOLOGY

## 2017-10-22 ENCOUNTER — Encounter: Payer: Self-pay | Admitting: Gastroenterology

## 2017-10-29 ENCOUNTER — Ambulatory Visit (INDEPENDENT_AMBULATORY_CARE_PROVIDER_SITE_OTHER): Admitting: Surgery

## 2017-10-29 DIAGNOSIS — Z09 Encounter for follow-up examination after completed treatment for conditions other than malignant neoplasm: Secondary | ICD-10-CM

## 2017-10-29 NOTE — Progress Notes (Signed)
S/p Rob Chole Doing great No need for narcotics Taking PO, no  Fevers or chills Path d/w pt  PE NAD Abd: incision c/d/i, no infection  A/P Doing well No heavy lifting RTC prn

## 2017-10-29 NOTE — Patient Instructions (Signed)

## 2018-07-06 ENCOUNTER — Ambulatory Visit (INDEPENDENT_AMBULATORY_CARE_PROVIDER_SITE_OTHER): Payer: Medicare PPO | Admitting: Family Medicine

## 2018-07-06 ENCOUNTER — Encounter: Payer: Self-pay | Admitting: Family Medicine

## 2018-07-06 VITALS — BP 138/86 | HR 80 | Ht 68.0 in | Wt 179.0 lb

## 2018-07-06 DIAGNOSIS — Z7689 Persons encountering health services in other specified circumstances: Secondary | ICD-10-CM | POA: Diagnosis not present

## 2018-07-06 DIAGNOSIS — Z23 Encounter for immunization: Secondary | ICD-10-CM

## 2018-07-06 NOTE — Progress Notes (Signed)
Date:  07/06/2018   Name:  Derek Figueroa   DOB:  October 29, 1950   MRN:  [id number]   Chief Complaint: Establish Care (re-establishing due to insurance) and prevnar 13  Patient is a 68 year old male who presents for an establish care exam. The patient reports the following problems: none. Health maintenance has been reviewed need for prevnar.   Review of Systems  Constitutional: Negative for chills and fever.  HENT: Negative for drooling, ear discharge, ear pain and sore throat.   Respiratory: Negative for cough, shortness of breath and wheezing.   Cardiovascular: Negative for chest pain, palpitations and leg swelling.  Gastrointestinal: Negative for abdominal pain, blood in stool, constipation, diarrhea and nausea.  Endocrine: Negative for polydipsia.  Genitourinary: Negative for dysuria, frequency, hematuria and urgency.  Musculoskeletal: Negative for back pain, myalgias and neck pain.  Skin: Negative for rash.  Allergic/Immunologic: Negative for environmental allergies.  Neurological: Negative for dizziness and headaches.  Hematological: Does not bruise/bleed easily.  Psychiatric/Behavioral: Negative for decreased concentration, dysphoric mood, hallucinations and suicidal ideas. The patient is not nervous/anxious.     Patient Active Problem List   Diagnosis Date Noted  . Cholecystitis, acute   . Obstruction of bile duct   . Jaundice   . Abnormal findings on imaging of biliary tract   . Residual hemorrhoidal skin tags 06/23/2014  . Right shoulder pain 01/05/2014  . Gastroesophageal reflux disease without esophagitis 01/05/2014  . Elevated blood pressure 01/05/2014  . History of adenomatous polyp of colon 05/26/2006    No Known Allergies  Past Surgical History:  Procedure Laterality Date  . ERCP N/A 08/13/2017   Procedure: ENDOSCOPIC RETROGRADE CHOLANGIOPANCREATOGRAPHY (ERCP);  Surgeon: Lucilla Lame, MD;  Location: [facility] ENDOSCOPY;  Service: Endoscopy;  Laterality: N/A;   . EYE SURGERY    . gall stones    . HERNIA REPAIR    . ROBOTIC ASSISTED LAPAROSCOPIC CHOLECYSTECTOMY-MULTI SITE N/A 10/20/2017   Procedure: ROBOTIC ASSISTED LAPAROSCOPIC CHOLECYSTECTOMY-MULTI SITE;  Surgeon: Jules Husbands, MD;  Location: [facility] ORS;  Service: General;  Laterality: N/A;  . UMBILICAL HERNIA REPAIR N/A 10/20/2017   Procedure: HERNIA REPAIR UMBILICAL ADULT;  Surgeon: Jules Husbands, MD;  Location: [facility] ORS;  Service: General;  Laterality: N/A;    Social History   Tobacco Use  . Smoking status: Former Smoker    Last attempt to quit: 09/02/1978    Years since quitting: 39.8  . Smokeless tobacco: Never Used  . Tobacco comment: quit 40 yrs  Substance Use Topics  . Alcohol use: Yes    Alcohol/week: 3.0 standard drinks    Types: 3 Glasses of wine per week  . Drug use: No     Medication list has been reviewed and updated.  Current Meds  Medication Sig  . Alum Hydroxide-Mag Carbonate (GAVISCON PO) Take 1 tablet by mouth daily as needed (for stomach).  . Multiple Vitamins-Minerals (MULTIVITAMIN ADULT PO) Take 1 tablet by mouth daily.   . Omega-3 Fatty Acids (FISH OIL PO) Take 1 capsule by mouth daily.     PHQ 2/9 Scores 07/06/2018  PHQ - 2 Score 0  PHQ- 9 Score 0    Physical Exam Vitals signs and nursing note reviewed.  Constitutional:      Appearance: Normal appearance. He is normal weight.  HENT:     Head: Normocephalic.     Right Ear: Tympanic membrane, ear canal and external ear normal. There is no impacted cerumen.     Left Ear:  Tympanic membrane, ear canal and external ear normal. There is no impacted cerumen.     Nose: Nose normal. No congestion or rhinorrhea.     Mouth/Throat:     Mouth: Mucous membranes are moist.  Eyes:     General: No scleral icterus.       Right eye: No discharge.        Left eye: No discharge.     Conjunctiva/sclera: Conjunctivae normal.     Pupils: Pupils are equal, round, and reactive to light.  Neck:     Musculoskeletal: Normal  range of motion and neck supple.     Thyroid: No thyromegaly.     Vascular: No JVD.     Trachea: No tracheal deviation.  Cardiovascular:     Rate and Rhythm: Normal rate and regular rhythm.     Pulses: Normal pulses.     Heart sounds: Normal heart sounds. No murmur. No friction rub. No gallop.   Pulmonary:     Effort: Pulmonary effort is normal. No respiratory distress.     Breath sounds: Normal breath sounds. No stridor. No wheezing, rhonchi or rales.  Chest:     Chest wall: No tenderness.  Abdominal:     General: Abdomen is flat. Bowel sounds are normal. There is no distension.     Palpations: Abdomen is soft. There is no mass.     Tenderness: There is no abdominal tenderness. There is no guarding or rebound.     Hernia: No hernia is present.  Musculoskeletal: Normal range of motion.        General: No tenderness.  Lymphadenopathy:     Cervical: No cervical adenopathy.  Skin:    General: Skin is warm.     Findings: No rash.  Neurological:     Mental Status: He is alert and oriented to person, place, and time.     Cranial Nerves: No cranial nerve deficit.     Deep Tendon Reflexes: Reflexes are normal and symmetric.     BP 138/86   Pulse 80   Ht _0  (1.727 m)   Wt 179 lb (81.2 kg)   BMI 27.22 kg/m   Assessment and Plan: 1. Establishing care with new doctor, encounter for Patient presents to establish care with new physician.  View of patient's chart includes previous diagnoses previous labs and imaging review of medications and review of most recent hospitalization for cholelithiasis with obstructed biliary duct .  Review of CMP noted that there was elevations of alk phos and liver enzymes but there was no repeat after surgery that I could detect.  Patient will be returning for a physical exam in 1 to 2 weeks at which time that we will do fasting labs.  2. Need for vaccination with 13-polyvalent pneumococcal conjugate vaccine Discussed and administered - Pneumococcal  conjugate vaccine 13-valent

## 2018-08-20 ENCOUNTER — Ambulatory Visit (INDEPENDENT_AMBULATORY_CARE_PROVIDER_SITE_OTHER): Payer: Medicare PPO | Admitting: Family Medicine

## 2018-08-20 ENCOUNTER — Other Ambulatory Visit: Payer: Self-pay

## 2018-08-20 ENCOUNTER — Encounter: Payer: Self-pay | Admitting: Family Medicine

## 2018-08-20 VITALS — BP 128/80 | HR 80 | Ht 68.0 in | Wt 177.0 lb

## 2018-08-20 DIAGNOSIS — Z0000 Encounter for general adult medical examination without abnormal findings: Secondary | ICD-10-CM

## 2018-08-20 DIAGNOSIS — G250 Essential tremor: Secondary | ICD-10-CM | POA: Diagnosis not present

## 2018-08-20 DIAGNOSIS — R351 Nocturia: Secondary | ICD-10-CM | POA: Diagnosis not present

## 2018-08-20 DIAGNOSIS — E785 Hyperlipidemia, unspecified: Secondary | ICD-10-CM | POA: Diagnosis not present

## 2018-08-20 NOTE — Progress Notes (Signed)
Date:  08/20/2018   Name:  Derek Figueroa   DOB:  1950-10-24   MRN:  [id number]   Chief Complaint: Annual Exam (needs labs)  Patient is a 68 year old male who presents for a comprehensive physical exam. The patient reports the following problems: seasonal allergies. Health maintenance has been reviewed up to date.   Review of Systems  Constitutional: Negative for chills and fever.  HENT: Positive for postnasal drip and rhinorrhea. Negative for drooling, ear discharge, ear pain and sore throat.   Respiratory: Negative for cough, shortness of breath and wheezing.   Cardiovascular: Negative for chest pain, palpitations and leg swelling.  Gastrointestinal: Negative for abdominal pain, blood in stool, constipation, diarrhea and nausea.  Endocrine: Negative for polydipsia.  Genitourinary: Negative for dysuria, frequency, hematuria and urgency.       Nocturia  Musculoskeletal: Negative for back pain, myalgias and neck pain.  Skin: Negative for rash.  Allergic/Immunologic: Negative for environmental allergies.  Neurological: Positive for tremors. Negative for dizziness and headaches.  Hematological: Does not bruise/bleed easily.  Psychiatric/Behavioral: Negative for suicidal ideas. The patient is not nervous/anxious.     Patient Active Problem List   Diagnosis Date Noted  . Cholecystitis, acute   . Obstruction of bile duct   . Jaundice   . Abnormal findings on imaging of biliary tract   . Residual hemorrhoidal skin tags 06/23/2014  . Right shoulder pain 01/05/2014  . Gastroesophageal reflux disease without esophagitis 01/05/2014  . Elevated blood pressure 01/05/2014  . History of adenomatous polyp of colon 05/26/2006    No Known Allergies  Past Surgical History:  Procedure Laterality Date  . ERCP N/A 08/13/2017   Procedure: ENDOSCOPIC RETROGRADE CHOLANGIOPANCREATOGRAPHY (ERCP);  Surgeon: Lucilla Lame, MD;  Location: [facility] ENDOSCOPY;  Service: Endoscopy;  Laterality: N/A;   . EYE SURGERY    . gall stones    . HERNIA REPAIR    . ROBOTIC ASSISTED LAPAROSCOPIC CHOLECYSTECTOMY-MULTI SITE N/A 10/20/2017   Procedure: ROBOTIC ASSISTED LAPAROSCOPIC CHOLECYSTECTOMY-MULTI SITE;  Surgeon: Jules Husbands, MD;  Location: [facility] ORS;  Service: General;  Laterality: N/A;  . UMBILICAL HERNIA REPAIR N/A 10/20/2017   Procedure: HERNIA REPAIR UMBILICAL ADULT;  Surgeon: Jules Husbands, MD;  Location: [facility] ORS;  Service: General;  Laterality: N/A;    Social History   Tobacco Use  . Smoking status: Former Smoker    Last attempt to quit: 09/02/1978    Years since quitting: 39.9  . Smokeless tobacco: Never Used  . Tobacco comment: quit 40 yrs  Substance Use Topics  . Alcohol use: Yes    Alcohol/week: 3.0 standard drinks    Types: 3 Glasses of wine per week  . Drug use: No     Medication list has been reviewed and updated.  Current Meds  Medication Sig  . Alum Hydroxide-Mag Carbonate (GAVISCON PO) Take 1 tablet by mouth daily as needed (for stomach).  . loratadine (CLARITIN) 10 MG tablet Take 10 mg by mouth daily.  . Multiple Vitamins-Minerals (MULTIVITAMIN ADULT PO) Take 1 tablet by mouth daily.   . Omega-3 Fatty Acids (FISH OIL PO) Take 1 capsule by mouth daily.     PHQ 2/9 Scores 08/20/2018 07/06/2018  PHQ - 2 Score 0 0  PHQ- 9 Score 0 0    BP Readings from Last 3 Encounters:  08/20/18 128/80  07/06/18 138/86  10/20/17 119/68    Physical Exam Vitals signs and nursing note reviewed.  Constitutional:  General: He is not in acute distress.    Appearance: Normal appearance. He is well-developed and normal weight.  HENT:     Head: Normocephalic.     Jaw: There is normal jaw occlusion.     Salivary Glands: Right salivary gland is not diffusely enlarged. Left salivary gland is not diffusely enlarged.     Right Ear: Hearing, tympanic membrane, ear canal and external ear normal.     Left Ear: Hearing, tympanic membrane, ear canal and external ear normal.      Nose: Nose normal. No nasal deformity.     Right Turbinates: Not enlarged, swollen or pale.     Left Turbinates: Not enlarged, swollen or pale.     Mouth/Throat:     Lips: Pink.     Mouth: Mucous membranes are moist.     Dentition: Normal dentition.     Tongue: No lesions. Tongue does not deviate from midline.     Palate: No lesions.     Pharynx: Oropharynx is clear. Uvula midline.     Tonsils: No tonsillar exudate.  Eyes:     General: Lids are normal. No scleral icterus.       Right eye: No discharge.        Left eye: No discharge.     Extraocular Movements:     Right eye: Normal extraocular motion.     Left eye: Normal extraocular motion.     Conjunctiva/sclera: Conjunctivae normal.     Pupils: Pupils are equal, round, and reactive to light.  Neck:     Musculoskeletal: Normal range of motion and neck supple. No edema or erythema.     Thyroid: No thyroid mass, thyromegaly or thyroid tenderness.     Vascular: Normal carotid pulses. No carotid bruit, hepatojugular reflux or JVD.     Trachea: Trachea and phonation normal. No tracheal deviation.  Cardiovascular:     Rate and Rhythm: Normal rate and regular rhythm.     Chest Wall: PMI is not displaced. No thrill.     Pulses: Normal pulses.          Carotid pulses are 2+ on the right side and 2+ on the left side.      Radial pulses are 2+ on the right side and 2+ on the left side.       Femoral pulses are 2+ on the right side and 2+ on the left side.      Popliteal pulses are 2+ on the right side and 2+ on the left side.       Dorsalis pedis pulses are 2+ on the right side and 2+ on the left side.       Posterior tibial pulses are 2+ on the right side and 2+ on the left side.     Heart sounds: Normal heart sounds, S1 normal and S2 normal. Heart sounds not distant. No murmur. No systolic murmur. No diastolic murmur. No friction rub. No gallop. No S3 or S4 sounds.   Pulmonary:     Effort: Pulmonary effort is normal. No respiratory  distress.     Breath sounds: Normal breath sounds and air entry. No decreased breath sounds, wheezing, rhonchi or rales.  Chest:     Chest wall: No mass.     Breasts: Breasts are symmetrical.        Right: Normal. No mass.        Left: Normal. No mass.  Abdominal:     General: Bowel sounds are normal. There  is no distension or abdominal bruit.     Palpations: Abdomen is rigid. There is no hepatomegaly, splenomegaly or mass.     Tenderness: There is no abdominal tenderness. There is no guarding or rebound.  Genitourinary:    Pubic Area: No rash.      Penis: Normal.      Scrotum/Testes: Normal.     Prostate: Normal. Not enlarged, not tender and no nodules present.     Rectum: Normal. Guaiac result negative. No mass or tenderness.  Musculoskeletal: Normal range of motion.        General: No tenderness.     Cervical back: Normal.     Thoracic back: Normal.     Lumbar back: Normal.     Right lower leg: No edema.     Left lower leg: No edema.  Lymphadenopathy:     Head:     Right side of head: No submental or submandibular adenopathy.     Left side of head: No submental or submandibular adenopathy.     Cervical: No cervical adenopathy.     Right cervical: No superficial, deep or posterior cervical adenopathy.    Left cervical: No superficial, deep or posterior cervical adenopathy.  Skin:    General: Skin is warm and moist.     Capillary Refill: Capillary refill takes less than 2 seconds.     Coloration: Skin is not ashen or pale.     Findings: No rash.  Neurological:     General: No focal deficit present.     Mental Status: He is alert and oriented to person, place, and time.     Cranial Nerves: Cranial nerves are intact. No cranial nerve deficit, dysarthria or facial asymmetry.     Deep Tendon Reflexes: Reflexes are normal and symmetric.  Psychiatric:        Attention and Perception: Attention and perception normal.        Mood and Affect: Mood and affect normal.        Speech:  Speech normal.        Behavior: Behavior is cooperative.     Wt Readings from Last 3 Encounters:  08/20/18 177 lb (80.3 kg)  07/06/18 179 lb (81.2 kg)  10/20/17 168 lb 8 oz (76.4 kg)    BP 128/80   Pulse 80   Ht 5\' 8"  (1.727 m)   Wt 177 lb (80.3 kg)   BMI 26.91 kg/m   Assessment and Plan:  1. Annual physical exam No subjective/objective concerns noted with history and physical exam.  His previous visits, labs, imaging, and specialty visits were reviewed.  Patient had lipid panel renal function and PSA obtained.Derek Figueroa is a 68 y.o. male who presents today for his Complete Annual Exam. He feels well. He reports exercising . He reports he is sleeping well.  - Lipid panel - Renal Function Panel - PSA  2. Nocturia Patient has occasional nocturia for which he is DRE did not have any abnormalities.  Patient will have a PSA checked. - Renal Function Panel  3. Essential tremor Patient has mild essential tremor that we discussed and information was provided.

## 2018-08-20 NOTE — Patient Instructions (Signed)

## 2018-08-21 LAB — LIPID PANEL
Chol/HDL Ratio: 3.8 ratio (ref 0.0–5.0)
Cholesterol, Total: 195 mg/dL (ref 100–199)
HDL: 51 mg/dL (ref >39)
LDL Calculated: 128 mg/dL — ABNORMAL HIGH (ref 0–99)
Triglycerides: 82 mg/dL (ref 0–149)
VLDL CHOLESTEROL CAL: 16 mg/dL (ref 5–40)

## 2018-08-21 LAB — RENAL FUNCTION PANEL
Albumin: 4.3 g/dL (ref 3.8–4.8)
BUN/Creatinine Ratio: 11 (ref 10–24)
BUN: 11 mg/dL (ref 8–27)
CO2: 24 mmol/L (ref 20–29)
CREATININE: 0.97 mg/dL (ref 0.76–1.27)
Calcium: 9.5 mg/dL (ref 8.6–10.2)
Chloride: 103 mmol/L (ref 96–106)
GFR, EST AFRICAN AMERICAN: 94 mL/min/{1.73_m2} (ref >59)
GFR, EST NON AFRICAN AMERICAN: 82 mL/min/{1.73_m2} (ref >59)
GLUCOSE: 100 mg/dL — AB (ref 65–99)
PHOSPHORUS: 2.3 mg/dL — AB (ref 2.8–4.1)
POTASSIUM: 4.8 mmol/L (ref 3.5–5.2)
Sodium: 143 mmol/L (ref 134–144)

## 2018-08-21 LAB — PSA: Prostate Specific Ag, Serum: 1.9 ng/mL (ref 0.0–4.0)

## 2019-01-18 ENCOUNTER — Other Ambulatory Visit: Payer: Self-pay

## 2019-01-18 ENCOUNTER — Ambulatory Visit (INDEPENDENT_AMBULATORY_CARE_PROVIDER_SITE_OTHER): Payer: Medicare PPO | Admitting: Family Medicine

## 2019-01-18 ENCOUNTER — Encounter: Payer: Self-pay | Admitting: Family Medicine

## 2019-01-18 VITALS — BP 150/88 | HR 100 | Ht 68.0 in | Wt 180.0 lb

## 2019-01-18 DIAGNOSIS — G250 Essential tremor: Secondary | ICD-10-CM

## 2019-01-18 DIAGNOSIS — I10 Essential (primary) hypertension: Secondary | ICD-10-CM

## 2019-01-18 MED ORDER — METOPROLOL SUCCINATE ER 50 MG PO TB24: 50.0000 mg | ORAL_TABLET | Freq: Every day | ORAL | 0 refills | Status: DC

## 2019-01-18 NOTE — Patient Instructions (Signed)
DASH Eating Plan DASH stands for "Dietary Approaches to Stop Hypertension." The DASH eating plan is a healthy eating plan that has been shown to reduce high blood pressure (hypertension). It may also reduce your risk for type 2 diabetes, heart disease, and stroke. The DASH eating plan may also help with weight loss. What are tips for following this plan?  General guidelines  Avoid eating more than 2,300 mg (milligrams) of salt (sodium) a day. If you have hypertension, you may need to reduce your sodium intake to 1,500 mg a day.  Limit alcohol intake to no more than 1 drink a day for nonpregnant women and 2 drinks a day for men. One drink equals 12 oz of beer, 5 oz of wine, or 1 oz of hard liquor.  Work with your health care provider to maintain a healthy body weight or to lose weight. Ask what an ideal weight is for you.  Get at least 30 minutes of exercise that causes your heart to beat faster (aerobic exercise) most days of the week. Activities may include walking, swimming, or biking.  Work with your health care provider or diet and nutrition specialist (dietitian) to adjust your eating plan to your individual calorie needs. Reading food labels   Check food labels for the amount of sodium per serving. Choose foods with less than 5 percent of the Daily Value of sodium. Generally, foods with less than 300 mg of sodium per serving fit into this eating plan.  To find whole grains, look for the word "whole" as the first word in the ingredient list. Shopping  Buy products labeled as "low-sodium" or "no salt added."  Buy fresh foods. Avoid canned foods and premade or frozen meals. Cooking  Avoid adding salt when cooking. Use salt-free seasonings or herbs instead of table salt or sea salt. Check with your health care provider or pharmacist before using salt substitutes.  Do not fry foods. Cook foods using healthy methods such as baking, boiling, grilling, and broiling instead.  Cook with  heart-healthy oils, such as olive, canola, soybean, or sunflower oil. Meal planning  Eat a balanced diet that includes: ? 5 or more servings of fruits and vegetables each day. At each meal, try to fill half of your plate with fruits and vegetables. ? Up to 6-8 servings of whole grains each day. ? Less than 6 oz of lean meat, poultry, or fish each day. A 3-oz serving of meat is about the same size as a deck of cards. One egg equals 1 oz. ? 2 servings of low-fat dairy each day. ? A serving of nuts, seeds, or beans 5 times each week. ? Heart-healthy fats. Healthy fats called Omega-3 fatty acids are found in foods such as flaxseeds and coldwater fish, like sardines, salmon, and mackerel.  Limit how much you eat of the following: ? Canned or prepackaged foods. ? Food that is high in trans fat, such as fried foods. ? Food that is high in saturated fat, such as fatty meat. ? Sweets, desserts, sugary drinks, and other foods with added sugar. ? Full-fat dairy products.  Do not salt foods before eating.  Try to eat at least 2 vegetarian meals each week.  Eat more home-cooked food and less restaurant, buffet, and fast food.  When eating at a restaurant, ask that your food be prepared with less salt or no salt, if possible. What foods are recommended? The items listed may not be a complete list. Talk with your dietitian about   what dietary choices are best for you. Grains Whole-grain or whole-wheat bread. Whole-grain or whole-wheat pasta. Brown rice. Oatmeal. Quinoa. Bulgur. Whole-grain and low-sodium cereals. Pita bread. Low-fat, low-sodium crackers. Whole-wheat flour tortillas. Vegetables Fresh or frozen vegetables (raw, steamed, roasted, or grilled). Low-sodium or reduced-sodium tomato and vegetable juice. Low-sodium or reduced-sodium tomato sauce and tomato paste. Low-sodium or reduced-sodium canned vegetables. Fruits All fresh, dried, or frozen fruit. Canned fruit in natural juice (without  added sugar). Meat and other protein foods Skinless chicken or turkey. Ground chicken or turkey. Pork with fat trimmed off. Fish and seafood. Egg whites. Dried beans, peas, or lentils. Unsalted nuts, nut butters, and seeds. Unsalted canned beans. Lean cuts of beef with fat trimmed off. Low-sodium, lean deli meat. Dairy Low-fat (1%) or fat-free (skim) milk. Fat-free, low-fat, or reduced-fat cheeses. Nonfat, low-sodium ricotta or cottage cheese. Low-fat or nonfat yogurt. Low-fat, low-sodium cheese. Fats and oils Soft margarine without trans fats. Vegetable oil. Low-fat, reduced-fat, or light mayonnaise and salad dressings (reduced-sodium). Canola, safflower, olive, soybean, and sunflower oils. Avocado. Seasoning and other foods Herbs. Spices. Seasoning mixes without salt. Unsalted popcorn and pretzels. Fat-free sweets. What foods are not recommended? The items listed may not be a complete list. Talk with your dietitian about what dietary choices are best for you. Grains Baked goods made with fat, such as croissants, muffins, or some breads. Dry pasta or rice meal packs. Vegetables Creamed or fried vegetables. Vegetables in a cheese sauce. Regular canned vegetables (not low-sodium or reduced-sodium). Regular canned tomato sauce and paste (not low-sodium or reduced-sodium). Regular tomato and vegetable juice (not low-sodium or reduced-sodium). Pickles. Olives. Fruits Canned fruit in a light or heavy syrup. Fried fruit. Fruit in cream or butter sauce. Meat and other protein foods Fatty cuts of meat. Ribs. Fried meat. Bacon. Sausage. Bologna and other processed lunch meats. Salami. Fatback. Hotdogs. Bratwurst. Salted nuts and seeds. Canned beans with added salt. Canned or smoked fish. Whole eggs or egg yolks. Chicken or turkey with skin. Dairy Whole or 2% milk, cream, and half-and-half. Whole or full-fat cream cheese. Whole-fat or sweetened yogurt. Full-fat cheese. Nondairy creamers. Whipped toppings.  Processed cheese and cheese spreads. Fats and oils Butter. Stick margarine. Lard. Shortening. Ghee. Bacon fat. Tropical oils, such as coconut, palm kernel, or palm oil. Seasoning and other foods Salted popcorn and pretzels. Onion salt, garlic salt, seasoned salt, table salt, and sea salt. Worcestershire sauce. Tartar sauce. Barbecue sauce. Teriyaki sauce. Soy sauce, including reduced-sodium. Steak sauce. Canned and packaged gravies. Fish sauce. Oyster sauce. Cocktail sauce. Horseradish that you find on the shelf. Ketchup. Mustard. Meat flavorings and tenderizers. Bouillon cubes. Hot sauce and Tabasco sauce. Premade or packaged marinades. Premade or packaged taco seasonings. Relishes. Regular salad dressings. Where to find more information:  National Heart, Lung, and Blood Institute: www.nhlbi.nih.gov  American Heart Association: www.heart.org Summary  The DASH eating plan is a healthy eating plan that has been shown to reduce high blood pressure (hypertension). It may also reduce your risk for type 2 diabetes, heart disease, and stroke.  With the DASH eating plan, you should limit salt (sodium) intake to 2,300 mg a day. If you have hypertension, you may need to reduce your sodium intake to 1,500 mg a day.  When on the DASH eating plan, aim to eat more fresh fruits and vegetables, whole grains, lean proteins, low-fat dairy, and heart-healthy fats.  Work with your health care provider or diet and nutrition specialist (dietitian) to adjust your eating plan to your   individual calorie needs. This information is not intended to replace advice given to you by your health care provider. Make sure you discuss any questions you have with your health care provider. Document Released: 05/01/2011 Document Revised: 04/24/2017 Document Reviewed: 05/05/2016 Elsevier Patient Education  [place]. Tremor A tremor is trembling or shaking that you cannot control. Most tremors affect the hands or arms.  Tremors can also affect the head, vocal cords, face, and other parts of the body. There are many types of tremors. Common types include:  Essential tremor. These usually occur in people older than 40. It may run in families and can happen in otherwise healthy people.  Resting tremor. These occur when the muscles are at rest, such as when your hands are resting in your lap. People with Parkinson's disease often have resting tremors.  Postural tremor. These occur when you try to hold a pose, such as keeping your hands outstretched.  Kinetic tremor. These occur during purposeful movement, such as trying to touch a finger to your nose.  Task-specific tremor. These may occur when you perform certain tasks such as writing, speaking, or standing.  Psychogenic tremor. These dramatically lessen or disappear when you are distracted. They can happen in people of all ages. Some types of tremors have no known cause. Tremors can also be a symptom of nervous system problems (neurological disorders) that may occur with aging. Some tremors go away with treatment, while others do not. Follow these instructions at home: Lifestyle      Limit alcohol intake to no more than 1 drink a day for nonpregnant women and 2 drinks a day for men. One drink equals 12 oz of beer, 5 oz of wine, or 1 oz of hard liquor.  Do not use any products that contain nicotine or tobacco, such as cigarettes and e-cigarettes. If you need help quitting, ask your health care provider.  Avoid extreme heat and extreme cold.  Limit your caffeine intake, as told by your health care provider.  Try to get 8 hours of sleep each night.  Find ways to manage your stress, such as meditation or yoga. General instructions  Take over-the-counter and prescription medicines only as told by your health care provider.  Keep all follow-up visits as told by your health care provider. This is important. Contact a health care provider if you:   Develop a tremor after starting a new medicine.  Have a tremor along with other symptoms such as: ? Numbness. ? Tingling. ? Pain. ? Weakness.  Notice that your tremor gets worse.  Notice that your tremor interferes with your day-to-day life. Summary  A tremor is trembling or shaking that you cannot control.  Most tremors affect the hands or arms.  Some types of tremors have no known cause. Others may be a symptom of nervous system problems (neurological disorders).  Make sure you discuss any tremors you have with your health care provider. This information is not intended to replace advice given to you by your health care provider. Make sure you discuss any questions you have with your health care provider. Document Released: 05/02/2002 Document Revised: 04/24/2017 Document Reviewed: 03/12/2017 Elsevier Patient Education  2020 [employer].

## 2019-01-18 NOTE — Progress Notes (Signed)
Date:  01/18/2019   Name:  Derek Figueroa   DOB:  Jan 06, 1951   MRN:  [id number]   Chief Complaint: Tremors (L) hand slightly R) hand is worse/ he is R) handed. )  Neurologic Problem The patient's pertinent negatives include no altered mental status, clumsiness, focal sensory loss, focal weakness, loss of balance, memory loss, near-syncope, slurred speech, syncope, visual change or weakness. This is a chronic problem. The current episode started more than 1 year ago. The neurological problem developed gradually. The problem has been gradually worsening since onset. Affected Side: bilateral R>L. Pertinent negatives include no abdominal pain, auditory change, aura, back pain, bladder incontinence, bowel incontinence, chest pain, confusion, diaphoresis, dizziness, fatigue, fever, headaches, light-headedness, nausea, neck pain, palpitations, shortness of breath, vertigo or vomiting. Past treatments include nothing. The treatment provided mild relief. There is no history of a bleeding disorder, a clotting disorder, a CVA, dementia, head trauma, liver disease, mood changes or seizures.  Hypertension This is a chronic problem. The current episode started more than 1 year ago. The problem has been gradually worsening since onset. The problem is uncontrolled. Pertinent negatives include no anxiety, blurred vision, chest pain, headaches, malaise/fatigue, neck pain, orthopnea, palpitations, peripheral edema, PND, shortness of breath or sweats. There are no associated agents to hypertension. The current treatment provides no improvement. There is no history of angina, kidney disease, CAD/MI, CVA, heart failure, left ventricular hypertrophy, PVD or retinopathy. There is no history of chronic renal disease, a hypertension causing med or renovascular disease.    Review of Systems  Constitutional: Negative for chills, diaphoresis, fatigue, fever and malaise/fatigue.  HENT: Negative for drooling, ear discharge,  ear pain and sore throat.   Eyes: Negative for blurred vision.  Respiratory: Negative for cough, shortness of breath and wheezing.   Cardiovascular: Negative for chest pain, palpitations, orthopnea, leg swelling, PND and near-syncope.  Gastrointestinal: Negative for abdominal pain, blood in stool, bowel incontinence, constipation, diarrhea, nausea and vomiting.  Endocrine: Negative for polydipsia.  Genitourinary: Negative for bladder incontinence, dysuria, frequency, hematuria and urgency.  Musculoskeletal: Negative for back pain, myalgias and neck pain.  Skin: Negative for rash.  Allergic/Immunologic: Negative for environmental allergies.  Neurological: Positive for tremors. Negative for dizziness, vertigo, focal weakness, syncope, weakness, light-headedness, headaches and loss of balance.  Hematological: Does not bruise/bleed easily.  Psychiatric/Behavioral: Negative for confusion, memory loss and suicidal ideas. The patient is not nervous/anxious.     Patient Active Problem List   Diagnosis Date Noted  . Cholecystitis, acute   . Obstruction of bile duct   . Jaundice   . Abnormal findings on imaging of biliary tract   . Residual hemorrhoidal skin tags 06/23/2014  . Right shoulder pain 01/05/2014  . Gastroesophageal reflux disease without esophagitis 01/05/2014  . Elevated blood pressure 01/05/2014  . History of adenomatous polyp of colon 05/26/2006    No Known Allergies  Past Surgical History:  Procedure Laterality Date  . ERCP N/A 08/13/2017   Procedure: ENDOSCOPIC RETROGRADE CHOLANGIOPANCREATOGRAPHY (ERCP);  Surgeon: Lucilla Lame, MD;  Location: [facility] ENDOSCOPY;  Service: Endoscopy;  Laterality: N/A;  . EYE SURGERY    . gall stones    . HERNIA REPAIR    . ROBOTIC ASSISTED LAPAROSCOPIC CHOLECYSTECTOMY-MULTI SITE N/A 10/20/2017   Procedure: ROBOTIC ASSISTED LAPAROSCOPIC CHOLECYSTECTOMY-MULTI SITE;  Surgeon: Jules Husbands, MD;  Location: [facility] ORS;  Service: General;  Laterality:  N/A;  . UMBILICAL HERNIA REPAIR N/A 10/20/2017   Procedure: HERNIA REPAIR UMBILICAL ADULT;  Surgeon:  Jules Husbands, MD;  Location: [facility] ORS;  Service: General;  Laterality: N/A;    Social History   Tobacco Use  . Smoking status: Former Smoker    Quit date: 09/02/1978    Years since quitting: 40.4  . Smokeless tobacco: Never Used  . Tobacco comment: quit 40 yrs  Substance Use Topics  . Alcohol use: Yes    Alcohol/week: 3.0 standard drinks    Types: 3 Glasses of wine per week  . Drug use: No     Medication list has been reviewed and updated.  Current Meds  Medication Sig  . Alum Hydroxide-Mag Carbonate (GAVISCON PO) Take 1 tablet by mouth daily as needed (for stomach).  . loratadine (CLARITIN) 10 MG tablet Take 10 mg by mouth daily.  . Multiple Vitamins-Minerals (MULTIVITAMIN ADULT PO) Take 1 tablet by mouth daily.   . Omega-3 Fatty Acids (FISH OIL PO) Take 1 capsule by mouth daily.     PHQ 2/9 Scores 01/18/2019 08/20/2018 07/06/2018  PHQ - 2 Score 0 0 0  PHQ- 9 Score 0 0 0    BP Readings from Last 3 Encounters:  01/18/19 (!) 150/88  08/20/18 128/80  07/06/18 138/86    Physical Exam Vitals signs reviewed.  HENT:     Head: Normocephalic.     Right Ear: Tympanic membrane, ear canal and external ear normal.     Left Ear: Tympanic membrane, ear canal and external ear normal.     Nose: Nose normal.     Mouth/Throat:     Mouth: Mucous membranes are moist.  Eyes:     General: No scleral icterus.       Right eye: No discharge.        Left eye: No discharge.     Conjunctiva/sclera: Conjunctivae normal.     Pupils: Pupils are equal, round, and reactive to light.  Neck:     Musculoskeletal: Normal range of motion and neck supple.     Thyroid: No thyromegaly.     Vascular: No JVD.     Trachea: No tracheal deviation.  Cardiovascular:     Rate and Rhythm: Normal rate and regular rhythm.     Heart sounds: Normal heart sounds. No murmur. No friction rub. No gallop.    Pulmonary:     Effort: Pulmonary effort is normal. No respiratory distress.     Breath sounds: Normal breath sounds. No wheezing or rales.  Abdominal:     General: Bowel sounds are normal.     Palpations: Abdomen is soft. There is no mass.     Tenderness: There is no abdominal tenderness. There is no guarding or rebound.  Musculoskeletal: Normal range of motion.        General: No tenderness.  Lymphadenopathy:     Cervical: No cervical adenopathy.  Skin:    General: Skin is warm.     Findings: No rash.  Neurological:     Mental Status: He is alert and oriented to person, place, and time.     Cranial Nerves: No cranial nerve deficit.     Deep Tendon Reflexes: Reflexes are normal and symmetric.     Wt Readings from Last 3 Encounters:  01/18/19 180 lb (81.6 kg)  08/20/18 177 lb (80.3 kg)  07/06/18 179 lb (81.2 kg)    BP (!) 150/88   Pulse 100   Ht 5\' 8"  (1.727 m)   Wt 180 lb (81.6 kg)   BMI 27.37 kg/m   Assessment and Plan:  1. Essential hypertension Patient returns today and blood pressure is noted to be elevated.  Patient is currently only watching his sodium intake.  Will initiate metoprolol XL 50 mg once a day and patient has been given information concerning DASH diet. - metoprolol succinate (TOPROL-XL) 50 MG 24 hr tablet; Take 1 tablet (50 mg total) by mouth daily. Take with or immediately following a meal.  Dispense: 60 tablet; Refill: 0  2. Essential tremor Patient also primarily is here because of the continuous of his tremor which is gradually increased and is more so in the right than the left.  This has affected eating with a spoon as well as writing.  Neurologic exam shows no progression of any other focal circumstance and this is consistent with an essential tremor will for therapeutic as well as diagnostic purposes initiate metoprolol 50 mg sustained release once a day.  We will recheck blood pressure and tremor circumstance in 6 weeks. - metoprolol succinate  (TOPROL-XL) 50 MG 24 hr tablet; Take 1 tablet (50 mg total) by mouth daily. Take with or immediately following a meal.  Dispense: 60 tablet; Refill: 0

## 2019-03-01 ENCOUNTER — Ambulatory Visit (INDEPENDENT_AMBULATORY_CARE_PROVIDER_SITE_OTHER): Payer: Medicare PPO | Admitting: Family Medicine

## 2019-03-01 ENCOUNTER — Other Ambulatory Visit: Payer: Self-pay

## 2019-03-01 ENCOUNTER — Encounter: Payer: Self-pay | Admitting: Family Medicine

## 2019-03-01 VITALS — BP 138/80 | HR 60 | Ht 68.0 in | Wt 173.0 lb

## 2019-03-01 DIAGNOSIS — E7800 Pure hypercholesterolemia, unspecified: Secondary | ICD-10-CM | POA: Diagnosis not present

## 2019-03-01 DIAGNOSIS — I10 Essential (primary) hypertension: Secondary | ICD-10-CM | POA: Diagnosis not present

## 2019-03-01 DIAGNOSIS — R449 Unspecified symptoms and signs involving general sensations and perceptions: Secondary | ICD-10-CM

## 2019-03-01 DIAGNOSIS — G250 Essential tremor: Secondary | ICD-10-CM | POA: Diagnosis not present

## 2019-03-01 MED ORDER — METOPROLOL SUCCINATE ER 50 MG PO TB24: 50.0000 mg | ORAL_TABLET | Freq: Every day | ORAL | 1 refills | Status: DC

## 2019-03-01 NOTE — Progress Notes (Signed)
Date:  03/01/2019   Name:  Derek Figueroa   DOB:  1951-05-05   MRN:  [id number]   Chief Complaint: Hypertension (refill metoprolol)  Hypertension This is a chronic problem. The current episode started more than 1 year ago. The problem is unchanged. The problem is controlled. Pertinent negatives include no anxiety, blurred vision, chest pain, headaches, malaise/fatigue, neck pain, orthopnea, palpitations, peripheral edema, PND, shortness of breath or sweats. There are no associated agents to hypertension. There are no known risk factors for coronary artery disease. Past treatments include beta blockers. There are no compliance problems.  There is no history of angina, kidney disease, CAD/MI, CVA, heart failure or left ventricular hypertrophy. There is no history of chronic renal disease.  Hyperlipidemia This is a chronic problem. The current episode started more than 1 year ago. The problem is controlled. Recent lipid tests were reviewed and are variable. He has no history of chronic renal disease, diabetes, hypothyroidism, liver disease, obesity or nephrotic syndrome. There are no known factors aggravating his hyperlipidemia. Pertinent negatives include no chest pain, focal sensory loss, focal weakness, leg pain, myalgias or shortness of breath. Current antihyperlipidemic treatment includes diet change. The current treatment provides moderate improvement of lipids.  Neurologic Problem The patient's pertinent negatives include no altered mental status, clumsiness, focal sensory loss, focal weakness, loss of balance, memory loss, near-syncope, slurred speech, syncope, visual change or weakness. Primary symptoms comment: tremor. Pertinent negatives include no abdominal pain, back pain, chest pain, dizziness, fever, headaches, nausea, neck pain, palpitations or shortness of breath. There is no history of liver disease.    Review of Systems  Constitutional: Negative for chills, fever and  malaise/fatigue.  HENT: Negative for drooling, ear discharge, ear pain and sore throat.   Eyes: Negative for blurred vision.  Respiratory: Negative for cough, shortness of breath and wheezing.   Cardiovascular: Negative for chest pain, palpitations, orthopnea, leg swelling, PND and near-syncope.  Gastrointestinal: Negative for abdominal pain, blood in stool, constipation, diarrhea and nausea.  Endocrine: Negative for polydipsia.  Genitourinary: Negative for dysuria, frequency, hematuria and urgency.  Musculoskeletal: Negative for back pain, myalgias and neck pain.  Skin: Negative for rash.  Allergic/Immunologic: Negative for environmental allergies.  Neurological: Negative for dizziness, focal weakness, syncope, weakness, headaches and loss of balance.       Focal cutaneous sensory decresed  Hematological: Does not bruise/bleed easily.  Psychiatric/Behavioral: Negative for memory loss and suicidal ideas. The patient is not nervous/anxious.     Patient Active Problem List   Diagnosis Date Noted  . Cholecystitis, acute   . Obstruction of bile duct   . Jaundice   . Abnormal findings on imaging of biliary tract   . Residual hemorrhoidal skin tags 06/23/2014  . Right shoulder pain 01/05/2014  . Gastroesophageal reflux disease without esophagitis 01/05/2014  . Elevated blood pressure 01/05/2014  . History of adenomatous polyp of colon 05/26/2006    No Known Allergies  Past Surgical History:  Procedure Laterality Date  . ERCP N/A 08/13/2017   Procedure: ENDOSCOPIC RETROGRADE CHOLANGIOPANCREATOGRAPHY (ERCP);  Surgeon: Lucilla Lame, MD;  Location: [facility] ENDOSCOPY;  Service: Endoscopy;  Laterality: N/A;  . EYE SURGERY    . gall stones    . HERNIA REPAIR    . ROBOTIC ASSISTED LAPAROSCOPIC CHOLECYSTECTOMY-MULTI SITE N/A 10/20/2017   Procedure: ROBOTIC ASSISTED LAPAROSCOPIC CHOLECYSTECTOMY-MULTI SITE;  Surgeon: Jules Husbands, MD;  Location: [facility] ORS;  Service: General;  Laterality: N/A;  .  UMBILICAL HERNIA REPAIR N/A 10/20/2017  Procedure: HERNIA REPAIR UMBILICAL ADULT;  Surgeon: Jules Husbands, MD;  Location: [facility] ORS;  Service: General;  Laterality: N/A;    Social History   Tobacco Use  . Smoking status: Former Smoker    Quit date: 09/02/1978    Years since quitting: 40.5  . Smokeless tobacco: Never Used  . Tobacco comment: quit 40 yrs  Substance Use Topics  . Alcohol use: Yes    Alcohol/week: 3.0 standard drinks    Types: 3 Glasses of wine per week  . Drug use: No     Medication list has been reviewed and updated.  Current Meds  Medication Sig  . metoprolol succinate (TOPROL-XL) 50 MG 24 hr tablet Take 1 tablet (50 mg total) by mouth daily. Take with or immediately following a meal.  . Multiple Vitamins-Minerals (MULTIVITAMIN ADULT PO) Take 1 tablet by mouth daily.   . Omega-3 Fatty Acids (FISH OIL PO) Take 1 capsule by mouth daily.     PHQ 2/9 Scores 03/01/2019 01/18/2019 08/20/2018 07/06/2018  PHQ - 2 Score 1 0 0 0  PHQ- 9 Score 1 0 0 0    BP Readings from Last 3 Encounters:  03/01/19 138/80  01/18/19 (!) 150/88  08/20/18 128/80    Physical Exam Vitals signs and nursing note reviewed.  HENT:     Head: Normocephalic.     Right Ear: External ear normal.     Left Ear: External ear normal.     Nose: Nose normal.  Eyes:     General: No scleral icterus.       Right eye: No discharge.        Left eye: No discharge.     Conjunctiva/sclera: Conjunctivae normal.     Pupils: Pupils are equal, round, and reactive to light.  Neck:     Musculoskeletal: Normal range of motion and neck supple.     Thyroid: No thyromegaly.     Vascular: No JVD.     Trachea: No tracheal deviation.  Cardiovascular:     Rate and Rhythm: Normal rate and regular rhythm.     Heart sounds: Normal heart sounds. No murmur. No friction rub. No gallop.   Pulmonary:     Effort: No respiratory distress.     Breath sounds: Normal breath sounds. No wheezing or rales.  Chest:     Abdominal:     General: Bowel sounds are normal.     Palpations: Abdomen is soft. There is no mass.     Tenderness: There is no abdominal tenderness. There is no guarding or rebound.  Musculoskeletal: Normal range of motion.        General: No tenderness.  Lymphadenopathy:     Cervical: No cervical adenopathy.  Skin:    General: Skin is warm.     Findings: No rash.  Neurological:     Mental Status: He is alert and oriented to person, place, and time.     Cranial Nerves: Cranial nerves are intact. No cranial nerve deficit.     Sensory: Sensory deficit present.     Motor: Motor function is intact.     Deep Tendon Reflexes: Reflexes are normal and symmetric.     Comments: Right lower chest     Wt Readings from Last 3 Encounters:  03/01/19 173 lb (78.5 kg)  01/18/19 180 lb (81.6 kg)  08/20/18 177 lb (80.3 kg)    BP 138/80   Pulse 60   Ht 5\' 8"  (1.727 m)   Wt 173 lb (  78.5 kg)   BMI 26.30 kg/m   Assessment and Plan: 1. Essential hypertension Chronic.  Controlled.  Continue metoprolol XL 50 mg once a day.  Will check renal function panel.  Will recheck in 6 months. - metoprolol succinate (TOPROL-XL) 50 MG 24 hr tablet; Take 1 tablet (50 mg total) by mouth daily. Take with or immediately following a meal.  Dispense: 180 tablet; Refill: 1 - Renal Function Panel  2. Essential tremor Chronic.  Controlled.  Continue metoprolol XL 50 mg once a day. - metoprolol succinate (TOPROL-XL) 50 MG 24 hr tablet; Take 1 tablet (50 mg total) by mouth daily. Take with or immediately following a meal.  Dispense: 180 tablet; Refill: 1  3. Elevated LDL cholesterol level There is mild elevation of LDL will control with diet patient was given for control.  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  Probably has been there for a while but  we will keep an eye on this and if it expands or other dermatomes are involved we will investigate further.

## 2019-03-01 NOTE — Patient Instructions (Signed)

## 2019-03-02 LAB — RENAL FUNCTION PANEL
Albumin: 4.3 g/dL (ref 3.8–4.8)
BUN/Creatinine Ratio: 14 (ref 10–24)
BUN: 14 mg/dL (ref 8–27)
CO2: 24 mmol/L (ref 20–29)
Calcium: 9.3 mg/dL (ref 8.6–10.2)
Chloride: 104 mmol/L (ref 96–106)
Creatinine, Ser: 0.97 mg/dL (ref 0.76–1.27)
GFR calc Af Amer: 94 mL/min/{1.73_m2} (ref >59)
GFR calc non Af Amer: 81 mL/min/{1.73_m2} (ref >59)
Glucose: 86 mg/dL (ref 65–99)
Phosphorus: 2.3 mg/dL — ABNORMAL LOW (ref 2.8–4.1)
Potassium: 4.3 mmol/L (ref 3.5–5.2)
Sodium: 140 mmol/L (ref 134–144)

## 2019-05-13 ENCOUNTER — Other Ambulatory Visit: Payer: Self-pay

## 2019-05-13 ENCOUNTER — Encounter: Payer: Self-pay | Admitting: Family Medicine

## 2019-05-13 ENCOUNTER — Ambulatory Visit (INDEPENDENT_AMBULATORY_CARE_PROVIDER_SITE_OTHER): Payer: Medicare PPO | Admitting: Family Medicine

## 2019-05-13 VITALS — BP 132/76 | HR 70 | Ht 68.0 in | Wt 179.0 lb

## 2019-05-13 DIAGNOSIS — H1031 Unspecified acute conjunctivitis, right eye: Secondary | ICD-10-CM

## 2019-05-13 MED ORDER — SULFACETAMIDE SODIUM 10 % OP SOLN: 1.0000 [drp] | OPHTHALMIC | 0 refills | Status: DC

## 2019-05-13 NOTE — Progress Notes (Signed)
Date:  05/13/2019   Name:  Derek Figueroa   DOB:  1950-08-06   MRN:  [id number]   Chief Complaint: Eye Pain (Rt eye. Early this week got soap in eye while showering. Cleaned it out but now eye is swollen and red. )  Eye Pain  The right eye is affected. This is a new problem. The current episode started in the past 7 days (Monday). The problem occurs daily. The problem has been unchanged (erythema/conjunctiva). Injury mechanism: ? soap in eye. The pain is moderate. There is no known exposure to pink eye. He does not wear contacts. Associated symptoms include an eye discharge, eye redness, a foreign body sensation and itching. Pertinent negatives include no blurred vision, double vision, fever, nausea, photophobia, recent URI or vomiting. He has tried water (compress) for the symptoms. The treatment provided moderate relief.    Lab Results  Component Value Date   CREATININE 0.97 03/01/2019   BUN 14 03/01/2019   NA 140 03/01/2019   K 4.3 03/01/2019   CL 104 03/01/2019   CO2 24 03/01/2019   Lab Results  Component Value Date   CHOL 195 08/20/2018   HDL 51 08/20/2018   LDLCALC 128 (H) 08/20/2018   TRIG 82 08/20/2018   CHOLHDL 3.8 08/20/2018   No results found for: TSH No results found for: HGBA1C   Review of Systems  Constitutional: Negative for chills and fever.  HENT: Negative for drooling, ear discharge, ear pain and sore throat.   Eyes: Positive for pain, discharge, redness and itching. Negative for blurred vision, double vision and photophobia.  Respiratory: Negative for cough, shortness of breath and wheezing.   Cardiovascular: Negative for chest pain, palpitations and leg swelling.  Gastrointestinal: Negative for abdominal pain, blood in stool, constipation, diarrhea, nausea and vomiting.  Endocrine: Negative for polydipsia.  Genitourinary: Negative for dysuria, frequency, hematuria and urgency.  Musculoskeletal: Negative for back pain, myalgias and neck pain.    Skin: Negative for rash.  Allergic/Immunologic: Negative for environmental allergies.  Neurological: Negative for dizziness and headaches.  Hematological: Does not bruise/bleed easily.  Psychiatric/Behavioral: Negative for suicidal ideas. The patient is not nervous/anxious.     Patient Active Problem List   Diagnosis Date Noted  . Cholecystitis, acute   . Obstruction of bile duct   . Jaundice   . Abnormal findings on imaging of biliary tract   . Residual hemorrhoidal skin tags 06/23/2014  . Right shoulder pain 01/05/2014  . Gastroesophageal reflux disease without esophagitis 01/05/2014  . Elevated blood pressure 01/05/2014  . History of adenomatous polyp of colon 05/26/2006    No Known Allergies  Past Surgical History:  Procedure Laterality Date  . ERCP N/A 08/13/2017   Procedure: ENDOSCOPIC RETROGRADE CHOLANGIOPANCREATOGRAPHY (ERCP);  Surgeon: Lucilla Lame, MD;  Location: [facility] ENDOSCOPY;  Service: Endoscopy;  Laterality: N/A;  . EYE SURGERY    . gall stones    . HERNIA REPAIR    . ROBOTIC ASSISTED LAPAROSCOPIC CHOLECYSTECTOMY-MULTI SITE N/A 10/20/2017   Procedure: ROBOTIC ASSISTED LAPAROSCOPIC CHOLECYSTECTOMY-MULTI SITE;  Surgeon: Jules Husbands, MD;  Location: [facility] ORS;  Service: General;  Laterality: N/A;  . UMBILICAL HERNIA REPAIR N/A 10/20/2017   Procedure: HERNIA REPAIR UMBILICAL ADULT;  Surgeon: Jules Husbands, MD;  Location: [facility] ORS;  Service: General;  Laterality: N/A;    Social History   Tobacco Use  . Smoking status: Former Smoker    Quit date: 09/02/1978    Years since quitting: 40.7  . Smokeless  tobacco: Never Used  . Tobacco comment: quit 40 yrs  Substance Use Topics  . Alcohol use: Yes    Alcohol/week: 3.0 standard drinks    Types: 3 Glasses of wine per week  . Drug use: No     Medication list has been reviewed and updated.  Current Meds  Medication Sig  . Alum Hydroxide-Mag Carbonate (GAVISCON PO) Take 1 tablet by mouth daily as needed (for  stomach).  . loratadine (CLARITIN) 10 MG tablet Take 10 mg by mouth daily.  . metoprolol succinate (TOPROL-XL) 50 MG 24 hr tablet Take 1 tablet (50 mg total) by mouth daily. Take with or immediately following a meal.  . Multiple Vitamins-Minerals (MULTIVITAMIN ADULT PO) Take 1 tablet by mouth daily.   . Omega-3 Fatty Acids (FISH OIL PO) Take 1 capsule by mouth daily.     PHQ 2/9 Scores 05/13/2019 03/01/2019 01/18/2019 08/20/2018  PHQ - 2 Score 0 1 0 0  PHQ- 9 Score - 1 0 0    BP Readings from Last 3 Encounters:  05/13/19 132/76  03/01/19 138/80  01/18/19 (!) 150/88    Physical Exam Vitals and nursing note reviewed.  HENT:     Head: Normocephalic.     Right Ear: Tympanic membrane, ear canal and external ear normal.     Left Ear: Tympanic membrane, ear canal and external ear normal.     Nose: Nose normal.  Eyes:     General: Lids are normal. Lids are everted, no foreign bodies appreciated. Vision grossly intact. Gaze aligned appropriately. No scleral icterus.       Right eye: No foreign body, discharge or hordeolum.        Left eye: No foreign body, discharge or hordeolum.     Extraocular Movements: Extraocular movements intact.     Conjunctiva/sclera:     Right eye: Right conjunctiva is injected. No chemosis, exudate or hemorrhage.    Left eye: Left conjunctiva is not injected. No chemosis, exudate or hemorrhage.    Pupils: Pupils are equal, round, and reactive to light.     Comments: 20/30 left 20/25 right/ no periorbital erythema  Neck:     Thyroid: No thyromegaly.     Vascular: No JVD.     Trachea: No tracheal deviation.  Cardiovascular:     Rate and Rhythm: Normal rate and regular rhythm.     Heart sounds: Normal heart sounds. No murmur. No friction rub. No gallop.   Pulmonary:     Effort: No respiratory distress.     Breath sounds: Normal breath sounds. No decreased breath sounds, wheezing, rhonchi or rales.  Abdominal:     General: Bowel sounds are normal.      Palpations: Abdomen is soft. There is no mass.     Tenderness: There is no abdominal tenderness. There is no guarding or rebound.  Musculoskeletal:        General: No tenderness. Normal range of motion.     Cervical back: Normal range of motion and neck supple.  Lymphadenopathy:     Cervical: No cervical adenopathy.  Skin:    General: Skin is warm.     Findings: No rash.  Neurological:     Mental Status: He is alert and oriented to person, place, and time.     Cranial Nerves: No cranial nerve deficit.     Deep Tendon Reflexes: Reflexes are normal and symmetric.     Wt Readings from Last 3 Encounters:  05/13/19 179 lb (81.2 kg)  03/01/19 173 lb (78.5 kg)  01/18/19 180 lb (81.6 kg)    BP 132/76   Pulse 70   Ht 5\' 8"  (1.727 m)   Wt 179 lb (81.2 kg)   SpO2 98%   BMI 27.22 kg/m   Assessment and Plan: 1. Acute bacterial conjunctivitis of right eye New onset acute.  Stable.  Patient normal.  Exam is consistent with conjunctivitis likely bacterial and we will treat with Sodium Sulamyd 10% ophthalmic drops 1 drop in right eye every 3 hours while awake.  Patient also may get a partial tear washing system if he needs some cleaning out in between treatment.  Eyelid was everted and no foreign body was noted but there is a slight abrasion erythema swelling of the medial aspect of the lid conjunctiva noted. - sulfacetamide (BLEPH-10) 10 % ophthalmic solution; Place 1 drop into the right eye every 3 (three) hours.  Dispense: 15 mL; Refill: 0

## 2019-07-26 ENCOUNTER — Encounter: Payer: Self-pay | Admitting: Family Medicine

## 2019-07-26 ENCOUNTER — Ambulatory Visit (INDEPENDENT_AMBULATORY_CARE_PROVIDER_SITE_OTHER): Payer: Medicare HMO | Admitting: Family Medicine

## 2019-07-26 ENCOUNTER — Other Ambulatory Visit: Payer: Self-pay

## 2019-07-26 VITALS — BP 138/78 | HR 72 | Ht 68.0 in | Wt 175.0 lb

## 2019-07-26 DIAGNOSIS — M25559 Pain in unspecified hip: Secondary | ICD-10-CM | POA: Diagnosis not present

## 2019-07-26 MED ORDER — MELOXICAM 15 MG PO TABS: 15.0000 mg | ORAL_TABLET | Freq: Every day | ORAL | 2 refills | Status: DC

## 2019-07-26 NOTE — Progress Notes (Signed)
Date:  07/26/2019   Name:  Derek Figueroa   DOB:  1950-07-22   MRN:  [id number]   Chief Complaint: Hip Pain (referral to Dr Marry Guan PA)  Hip Pain  Incident onset: onset pain 1 month ago. There was no injury mechanism. The pain is present in the right hip. The quality of the pain is described as aching. The pain is mild. The pain has been intermittent since onset. Pertinent negatives include no inability to bear weight, loss of motion, loss of sensation, muscle weakness, numbness or tingling. The symptoms are aggravated by movement. He has tried nothing for the symptoms.    Lab Results  Component Value Date   CREATININE 0.97 03/01/2019   BUN 14 03/01/2019   NA 140 03/01/2019   K 4.3 03/01/2019   CL 104 03/01/2019   CO2 24 03/01/2019   Lab Results  Component Value Date   CHOL 195 08/20/2018   HDL 51 08/20/2018   LDLCALC 128 (H) 08/20/2018   TRIG 82 08/20/2018   CHOLHDL 3.8 08/20/2018   No results found for: TSH No results found for: HGBA1C   Review of Systems  Constitutional: Negative for chills and fever.  HENT: Negative for drooling, ear discharge, ear pain and sore throat.   Respiratory: Negative for cough, shortness of breath and wheezing.   Cardiovascular: Negative for chest pain, palpitations and leg swelling.  Gastrointestinal: Negative for abdominal pain, blood in stool, constipation, diarrhea and nausea.  Endocrine: Negative for polydipsia.  Genitourinary: Negative for dysuria, frequency, hematuria and urgency.  Musculoskeletal: Negative for back pain, myalgias and neck pain.  Skin: Negative for rash.  Allergic/Immunologic: Negative for environmental allergies.  Neurological: Negative for dizziness, tingling, numbness and headaches.  Hematological: Does not bruise/bleed easily.  Psychiatric/Behavioral: Negative for suicidal ideas. The patient is not nervous/anxious.     Patient Active Problem List   Diagnosis Date Noted  . Cholecystitis, acute   .  Obstruction of bile duct   . Jaundice   . Abnormal findings on imaging of biliary tract   . Residual hemorrhoidal skin tags 06/23/2014  . Right shoulder pain 01/05/2014  . Gastroesophageal reflux disease without esophagitis 01/05/2014  . Elevated blood pressure 01/05/2014  . History of adenomatous polyp of colon 05/26/2006    No Known Allergies  Past Surgical History:  Procedure Laterality Date  . ERCP N/A 08/13/2017   Procedure: ENDOSCOPIC RETROGRADE CHOLANGIOPANCREATOGRAPHY (ERCP);  Surgeon: Lucilla Lame, MD;  Location: [facility]COPY;  Service: Endoscopy;  Laterality: N/A;  . EYE SURGERY    . gall stones    . HERNIA REPAIR    . ROBOTIC ASSISTED LAPAROSCOPIC CHOLECYSTECTOMY-MULTI SITE N/A 10/20/2017   Procedure: ROBOTIC ASSISTED LAPAROSCOPIC CHOLECYSTECTOMY-MULTI SITE;  Surgeon: Jules Husbands, MD;  Location: [facility] ORS;  Service: General;  Laterality: N/A;  . UMBILICAL HERNIA REPAIR N/A 10/20/2017   Procedure: HERNIA REPAIR UMBILICAL ADULT;  Surgeon: Jules Husbands, MD;  Location: [facility] ORS;  Service: General;  Laterality: N/A;    Social History   Tobacco Use  . Smoking status: Former Smoker    Quit date: 09/02/1978    Years since quitting: 40.9  . Smokeless tobacco: Never Used  . Tobacco comment: quit 40 yrs  Substance Use Topics  . Alcohol use: Yes    Alcohol/week: 3.0 standard drinks    Types: 3 Glasses of wine per week  . Drug use: No     Medication list has been reviewed and updated.  Current Meds  Medication  Sig  . metoprolol succinate (TOPROL-XL) 50 MG 24 hr tablet Take 1 tablet (50 mg total) by mouth daily. Take with or immediately following a meal.  . Multiple Vitamins-Minerals (MULTIVITAMIN ADULT PO) Take 1 tablet by mouth daily.   . Omega-3 Fatty Acids (FISH OIL PO) Take 1 capsule by mouth daily.     PHQ 2/9 Scores 07/26/2019 05/13/2019 03/01/2019 01/18/2019  PHQ - 2 Score 0 0 1 0  PHQ- 9 Score 0 - 1 0    BP Readings from Last 3 Encounters:  07/26/19 138/78   05/13/19 132/76  03/01/19 138/80    Physical Exam Vitals and nursing note reviewed.  HENT:     Head: Normocephalic.     Right Ear: External ear normal.     Left Ear: External ear normal.     Nose: Nose normal. No congestion or rhinorrhea.  Eyes:     General: No scleral icterus.       Right eye: No discharge.        Left eye: No discharge.     Conjunctiva/sclera: Conjunctivae normal.     Pupils: Pupils are equal, round, and reactive to light.  Neck:     Thyroid: No thyromegaly.     Vascular: No JVD.     Trachea: No tracheal deviation.  Cardiovascular:     Rate and Rhythm: Normal rate and regular rhythm.     Heart sounds: Normal heart sounds. No murmur. No friction rub. No gallop.   Pulmonary:     Effort: No respiratory distress.     Breath sounds: Normal breath sounds. No wheezing or rales.  Abdominal:     General: Bowel sounds are normal.     Palpations: Abdomen is soft. There is no mass.     Tenderness: There is no abdominal tenderness. There is no guarding or rebound.  Musculoskeletal:        General: No tenderness. Normal range of motion.     Cervical back: Normal range of motion and neck supple.  Lymphadenopathy:     Cervical: No cervical adenopathy.  Skin:    General: Skin is warm.     Findings: No rash.  Neurological:     Mental Status: He is alert and oriented to person, place, and time.     Cranial Nerves: No cranial nerve deficit.     Deep Tendon Reflexes: Reflexes are normal and symmetric.     Wt Readings from Last 3 Encounters:  07/26/19 175 lb (79.4 kg)  05/13/19 179 lb (81.2 kg)  03/01/19 173 lb (78.5 kg)    BP 138/78   Pulse 72   Ht 5\' 8"  (1.727 m)   Wt 175 lb (79.4 kg)   BMI 26.61 kg/m   Assessment and Plan: 1. Hip pain New onset.  Persistent.  Episodic.  Patient has pain on the lateral aspect of the right hip that is described as a catch and immobilizes the patient for few seconds until resolved.  Exam notes that the patient has full  range of motion of the right hip with external and internal rotation with perhaps some tenderness of the lateral aspect.  Will refer to Rachelle Hora because the patient would eventually like to see Dr. Marry Guan in the meantime we will begin evaluation and will initiate with meloxicam 15 mg once a day. - meloxicam (MOBIC) 15 MG tablet; Take 1 tablet (15 mg total) by mouth daily.  Dispense: 30 tablet; Refill: 2 - Ambulatory referral to Orthopedic Surgery

## 2019-07-26 NOTE — Patient Instructions (Signed)
Snapping Hip Syndrome  Snapping hip syndrome is a condition that causes a feeling like a snap or a pop in your hip, especially when you walk, stand up from a chair, or swing your leg. Snapping hip syndrome can affect different areas of your hip, including the front, side, or back. Strong bands of tissue (tendons) attach the muscles in your buttocks, thighs, and pelvis to the bones of your hip. Snapping hip syndrome typically happens when a muscle or tendon moves across a bony part of your hip. Snapping hip can also involve torn or loose structures within the joint. What are the causes? This condition is usually caused by tight tendons or muscles around the hip. This often happens from overuse. What increases the risk? The following factors may make you more likely to develop this condition:  Being 67-37 years old.  Being a Tourist information centre manager, runner, weight lifter, gymnast, or soccer player.  Having had an injury to your hip or pelvis.  Having an abnormally shaped pelvis or leg (deformity). What are the signs or symptoms? Symptoms of this condition include:  A sensation like a snap or a pop in the front, side, or back of the hip when moving your leg. This can cause pain. The pain typically goes away when you stop moving.  Tightness in the hip.  Swelling in the front or side of the hip.  Leg weakness, especially when trying to lift your leg upward or sideways.  Difficulty getting out of low chairs. How is this diagnosed? This condition is diagnosed based on your symptoms, medical history, and a physical exam.  Your health care provider may have you move your leg into certain positions and then test your muscle strength.  You may have tests to rule out other causes of pain. These may include an X-ray, an ultrasound, or an MRI.  You may also have an injection of a numbing medicine (lidocaine) to see if that reduces your symptoms. How is this treated? Treatment for this condition  includes:  Stopping all activities that cause pain or make your condition worse.  Icing your hip to relieve pain.  Taking NSAIDs to reduce pain or having injections of medicine to reduce swelling (corticosteroids).  Doing range-of-motion and strengthening exercises (physical therapy) as told by your health care provider. You may need surgery to loosen your muscle and tendon or to remove any loose pieces of cartilage, if this applies. Follow these instructions at home: Managing pain, stiffness, and swelling   If directed, put ice on the injured area. ? Put ice in a plastic bag. ? Place a towel between your skin and the bag. ? Leave the ice on for 20 minutes, 2-3 times a day. General instructions  Take over-the-counter and prescription medicines only as told by your health care provider.  Return to your normal activities as told by your health care provider. Ask your health care provider what activities are safe for you.  Do physical therapy as told by your health care provider.  Keep all follow-up visits as told by your health care provider. This is important. How is this prevented?  Warm up and stretch before being active.  Cool down and stretch after being active.  Give your body time to rest between periods of activity.  Maintain physical fitness, including: ? Strength. ? Flexibility. Contact a health care provider if:  You have pain, swelling, and difficulty moving that gets worse.  Your leg or hip seems weak and feels like it cannot hold  you up.  You cannot stand or walk without severe pain. Summary  Snapping hip syndrome is a condition that causes a feeling like a snap or a pop in your hip, especially when you walk, stand up from a chair, or swing your leg.  This condition is usually caused by tight tendons or muscles around the hip.  It is treated by stopping activities that make it worse, icing, taking medicines, doing physical therapy, and having surgery if  needed. This information is not intended to replace advice given to you by your health care provider. Make sure you discuss any questions you have with your health care provider. Document Revised: 09/07/2018 Document Reviewed: 02/25/2018 Elsevier Patient Education  [place].

## 2019-08-18 ENCOUNTER — Telehealth: Payer: Self-pay | Admitting: Family Medicine

## 2019-08-18 NOTE — Telephone Encounter (Signed)
Left message for patient to call back and schedule Medicare Annual Wellness Visit (AWV) either virtually/audio only or in office. Whichever the patients preference is.  No history of AWV; please schedule at anytime with [facility] Health Advisor.

## 2019-08-24 ENCOUNTER — Ambulatory Visit (INDEPENDENT_AMBULATORY_CARE_PROVIDER_SITE_OTHER): Payer: Medicare HMO

## 2019-08-24 DIAGNOSIS — Z0000 Encounter for general adult medical examination without abnormal findings: Secondary | ICD-10-CM

## 2019-08-24 DIAGNOSIS — Z1211 Encounter for screening for malignant neoplasm of colon: Secondary | ICD-10-CM | POA: Diagnosis not present

## 2019-08-24 NOTE — Patient Instructions (Signed)
Derek Figueroa , Thank you for taking time to come for your Medicare Wellness Visit. I appreciate your ongoing commitment to your health goals. Please review the following plan we discussed and let me know if I can assist you in the future.   Screening recommendations/referrals: Colonoscopy: done 06/23/14. Referral sent to [facility] today. They will contact your for an appointment.  Recommended yearly ophthalmology/optometry visit for glaucoma screening and checkup Recommended yearly dental visit for hygiene and checkup  Vaccinations: Influenza vaccine: postponed Pneumococcal vaccine: done 07/06/18. Due for PPSV23 Tdap vaccine: done 06/20/13 Shingles vaccine: Shingrix discussed. Please contact your pharmacy for coverage information.   Advanced directives: Please bring a copy of your health care power of attorney and living will to the office at your convenience once you have completed those documents.   Conditions/risks identified: Recommend healthy eating and physical activity for desired weight loss  Next appointment: Please follow up in one year for your Medicare Annual Wellness visit.    Preventive Care 60 Years and Older, Male Preventive care refers to lifestyle choices and visits with your health care provider that can promote health and wellness. What does preventive care include?  A yearly physical exam. This is also called an annual well check.  Dental exams once or twice a year.  Routine eye exams. Ask your health care provider how often you should have your eyes checked.  Personal lifestyle choices, including:  Daily care of your teeth and gums.  Regular physical activity.  Eating a healthy diet.  Avoiding tobacco and drug use.  Limiting alcohol use.  Practicing safe sex.  Taking low doses of aspirin every day.  Taking vitamin and mineral supplements as recommended by your health care provider. What happens during an annual well check? The  services and screenings done by your health care provider during your annual well check will depend on your age, overall health, lifestyle risk factors, and family history of disease. Counseling  Your health care provider may ask you questions about your:  Alcohol use.  Tobacco use.  Drug use.  Emotional well-being.  Home and relationship well-being.  Sexual activity.  Eating habits.  History of falls.  Memory and ability to understand (cognition).  Work and work Statistician. Screening  You may have the following tests or measurements:  Height, weight, and BMI.  Blood pressure.  Lipid and cholesterol levels. These may be checked every 5 years, or more frequently if you are over 55 years old.  Skin check.  Lung cancer screening. You may have this screening every year starting at age 81 if you have a 30-pack-year history of smoking and currently smoke or have quit within the past 15 years.  Fecal occult blood test (FOBT) of the stool. You may have this test every year starting at age 43.  Flexible sigmoidoscopy or colonoscopy. You may have a sigmoidoscopy every 5 years or a colonoscopy every 10 years starting at age 27.  Prostate cancer screening. Recommendations will vary depending on your family history and other risks.  Hepatitis C blood test.  Hepatitis B blood test.  Sexually transmitted disease (STD) testing.  Diabetes screening. This is done by checking your blood sugar (glucose) after you have not eaten for a while (fasting). You may have this done every 1-3 years.  Abdominal aortic aneurysm (AAA) screening. You may need this if you are a current or former smoker.  Osteoporosis. You may be screened starting at age 63 if you are at high risk. Talk  with your health care provider about your test results, treatment options, and if necessary, the need for more tests. Vaccines  Your health care provider may recommend certain vaccines, such as:  Influenza  vaccine. This is recommended every year.  Tetanus, diphtheria, and acellular pertussis (Tdap, Td) vaccine. You may need a Td booster every 10 years.  Zoster vaccine. You may need this after age 64.  Pneumococcal 13-valent conjugate (PCV13) vaccine. One dose is recommended after age 11.  Pneumococcal polysaccharide (PPSV23) vaccine. One dose is recommended after age 68. Talk to your health care provider about which screenings and vaccines you need and how often you need them. This information is not intended to replace advice given to you by your health care provider. Make sure you discuss any questions you have with your health care provider. Document Released: 06/08/2015 Document Revised: 01/30/2016 Document Reviewed: 03/13/2015 Elsevier Interactive Patient Education  2017 [place] Prevention in the Home Falls can cause injuries. They can happen to people of all ages. There are many things you can do to make your home safe and to help prevent falls. What can I do on the outside of my home?  Regularly fix the edges of walkways and driveways and fix any cracks.  Remove anything that might make you trip as you walk through a door, such as a raised step or threshold.  Trim any bushes or trees on the path to your home.  Use bright outdoor lighting.  Clear any walking paths of anything that might make someone trip, such as rocks or tools.  Regularly check to see if handrails are loose or broken. Make sure that both sides of any steps have handrails.  Any raised decks and porches should have guardrails on the edges.  Have any leaves, snow, or ice cleared regularly.  Use sand or salt on walking paths during winter.  Clean up any spills in your garage right away. This includes oil or grease spills. What can I do in the bathroom?  Use night lights.  Install grab bars by the toilet and in the tub and shower. Do not use towel bars as grab bars.  Use non-skid mats or decals  in the tub or shower.  If you need to sit down in the shower, use a plastic, non-slip stool.  Keep the floor dry. Clean up any water that spills on the floor as soon as it happens.  Remove soap buildup in the tub or shower regularly.  Attach bath mats securely with double-sided non-slip rug tape.  Do not have throw rugs and other things on the floor that can make you trip. What can I do in the bedroom?  Use night lights.  Make sure that you have a light by your bed that is easy to reach.  Do not use any sheets or blankets that are too big for your bed. They should not hang down onto the floor.  Have a firm chair that has side arms. You can use this for support while you get dressed.  Do not have throw rugs and other things on the floor that can make you trip. What can I do in the kitchen?  Clean up any spills right away.  Avoid walking on wet floors.  Keep items that you use a lot in easy-to-reach places.  If you need to reach something above you, use a strong step stool that has a grab bar.  Keep electrical cords out of the way.  Do  not use floor polish or wax that makes floors slippery. If you must use wax, use non-skid floor wax.  Do not have throw rugs and other things on the floor that can make you trip. What can I do with my stairs?  Do not leave any items on the stairs.  Make sure that there are handrails on both sides of the stairs and use them. Fix handrails that are broken or loose. Make sure that handrails are as long as the stairways.  Check any carpeting to make sure that it is firmly attached to the stairs. Fix any carpet that is loose or worn.  Avoid having throw rugs at the top or bottom of the stairs. If you do have throw rugs, attach them to the floor with carpet tape.  Make sure that you have a light switch at the top of the stairs and the bottom of the stairs. If you do not have them, ask someone to add them for you. What else can I do to help  prevent falls?  Wear shoes that:  Do not have high heels.  Have rubber bottoms.  Are comfortable and fit you well.  Are closed at the toe. Do not wear sandals.  If you use a stepladder:  Make sure that it is fully opened. Do not climb a closed stepladder.  Make sure that both sides of the stepladder are locked into place.  Ask someone to hold it for you, if possible.  Clearly mark and make sure that you can see:  Any grab bars or handrails.  First and last steps.  Where the edge of each step is.  Use tools that help you move around (mobility aids) if they are needed. These include:  Canes.  Walkers.  Scooters.  Crutches.  Turn on the lights when you go into a dark area. Replace any light bulbs as soon as they burn out.  Set up your furniture so you have a clear path. Avoid moving your furniture around.  If any of your floors are uneven, fix them.  If there are any pets around you, be aware of where they are.  Review your medicines with your doctor. Some medicines can make you feel dizzy. This can increase your chance of falling. Ask your doctor what other things that you can do to help prevent falls. This information is not intended to replace advice given to you by your health care provider. Make sure you discuss any questions you have with your health care provider. Document Released: 03/08/2009 Document Revised: 10/18/2015 Document Reviewed: 06/16/2014 Elsevier Interactive Patient Education  2017 [employer].

## 2019-08-24 NOTE — Progress Notes (Signed)
Subjective:   Derek Figueroa is a 69 y.o. male who presents for an Initial Medicare Annual Wellness Visit.  Virtual Visit via Telephone Note  I connected with Derek Figueroa on 08/24/19 at  9:20 AM EDT by telephone and verified that I am speaking with the correct person using two identifiers.  Medicare Annual Wellness visit completed telephonically due to Covid-19 pandemic.   Location: Patient: home Provider: office   I discussed the limitations, risks, security and privacy concerns of performing an evaluation and management service by telephone and the availability of in person appointments. The patient expressed understanding and agreed to proceed.  Some vital signs may be absent or patient reported.   Clemetine Marker, LPN    Review of Systems   Cardiac Risk Factors include: advanced age (>26men, >70 women);hypertension;male gender    Objective:    There were no vitals filed for this visit. There is no height or weight on file to calculate BMI.  Advanced Directives 08/24/2019 08/13/2017 08/05/2017 06/20/2017  Does Patient Have a Medical Advance Directive? No No No No  Would patient like information on creating a medical advance directive? No - Patient declined No - Patient declined No - Patient declined -    Current Medications (verified) Outpatient Encounter Medications as of 08/24/2019  Medication Sig  . Alum Hydroxide-Mag Carbonate (GAVISCON PO) Take 1 tablet by mouth daily as needed (for stomach).  . loratadine (CLARITIN) 10 MG tablet Take 10 mg by mouth daily.  . meloxicam (MOBIC) 15 MG tablet Take 1 tablet (15 mg total) by mouth daily.  . metoprolol succinate (TOPROL-XL) 50 MG 24 hr tablet Take 1 tablet (50 mg total) by mouth daily. Take with or immediately following a meal.  . Multiple Vitamins-Minerals (MULTIVITAMIN ADULT PO) Take 1 tablet by mouth daily.   . Omega-3 Fatty Acids (FISH OIL PO) Take 1 capsule by mouth daily.    No facility-administered  encounter medications on file as of 08/24/2019.    Allergies (verified) Patient has no known allergies.   History: Past Medical History:  Diagnosis Date  . Heart murmur    Past Surgical History:  Procedure Laterality Date  . ERCP N/A 08/13/2017   Procedure: ENDOSCOPIC RETROGRADE CHOLANGIOPANCREATOGRAPHY (ERCP);  Surgeon: Lucilla Lame, MD;  Location: [facility] ENDOSCOPY;  Service: Endoscopy;  Laterality: N/A;  . EYE SURGERY    . gall stones    . HERNIA REPAIR    . ROBOTIC ASSISTED LAPAROSCOPIC CHOLECYSTECTOMY-MULTI SITE N/A 10/20/2017   Procedure: ROBOTIC ASSISTED LAPAROSCOPIC CHOLECYSTECTOMY-MULTI SITE;  Surgeon: Jules Husbands, MD;  Location: [facility] ORS;  Service: General;  Laterality: N/A;  . UMBILICAL HERNIA REPAIR N/A 10/20/2017   Procedure: HERNIA REPAIR UMBILICAL ADULT;  Surgeon: Jules Husbands, MD;  Location: [facility] ORS;  Service: General;  Laterality: N/A;   Family History  Problem Relation Age of Onset  . Heart disease Mother   . Diabetes Mother   . Stroke Mother   . Cancer Mother   . Heart disease Father   . COPD Father   . Stroke Father   . Lung cancer Brother   . COPD Brother   . Cancer Brother   . Cancer Maternal Grandmother   . Heart disease Maternal Grandmother   . Stroke Maternal Grandmother   . Cancer Maternal Grandfather   . Diabetes Maternal Grandfather   . Heart disease Maternal Grandfather    Social History   Socioeconomic History  . Marital status: Married    Spouse name: Not  on file  . Number of children: Not on file  . Years of education: Not on file  . Highest education level: Not on file  Occupational History  . Not on file  Tobacco Use  . Smoking status: Former Smoker    Quit date: 09/02/1978    Years since quitting: 41.0  . Smokeless tobacco: Never Used  . Tobacco comment: quit 40 yrs  Substance and Sexual Activity  . Alcohol use: Yes    Alcohol/week: 3.0 standard drinks    Types: 3 Glasses of wine per week  . Drug use: No  . Sexual  activity: Yes  Other Topics Concern  . Not on file  Social History Narrative  . Not on file   Social Determinants of Health   Financial Resource Strain: Low Risk   . Difficulty of Paying Living Expenses: Not hard at all  Food Insecurity: No Food Insecurity  . Worried About Charity fundraiser in the Last Year: Never true  . Ran Out of Food in the Last Year: Never true  Transportation Needs: No Transportation Needs  . Lack of Transportation (Medical): No  . Lack of Transportation (Non-Medical): No  Physical Activity: Inactive  . Days of Exercise per Week: 0 days  . Minutes of Exercise per Session: 0 min  Stress: No Stress Concern Present  . Feeling of Stress : Not at all  Social Connections: Slightly Isolated  . Frequency of Communication with Friends and Family: More than three times a week  . Frequency of Social Gatherings with Friends and Family: Once a week  . Attends Religious Services: More than 4 times per year  . Active Member of Clubs or Organizations: No  . Attends Archivist Meetings: Never  . Marital Status: Married   Tobacco Counseling Counseling given: Not Answered Comment: quit 40 yrs   Clinical Intake:  Pre-visit preparation completed: Yes  Pain : No/denies pain     Nutritional Risks: None Diabetes: No  How often do you need to have someone help you when you read instructions, pamphlets, or other written materials from your doctor or pharmacy?: 1 - Never  Interpreter Needed?: No  Information entered by :: Clemetine Marker LPN  Activities of Daily Living In your present state of health, do you have any difficulty performing the following activities: 08/24/2019  Hearing? Y  Comment declines hearing aids at this time  Vision? N  Difficulty concentrating or making decisions? N  Walking or climbing stairs? N  Dressing or bathing? N  Doing errands, shopping? N  Preparing Food and eating ? N  Using the Toilet? N  In the past six months, have  you accidently leaked urine? N  Do you have problems with loss of bowel control? N  Managing your Medications? N  Managing your Finances? N  Some recent data might be hidden     Immunizations and Health Maintenance Immunization History  Administered Date(s) Administered  . Influenza,inj,Quad PF,6+ Mos 03/16/2014  . Pneumococcal Conjugate-13 07/06/2018  . Tdap 06/20/2013   There are no preventive care reminders to display for this patient.  Patient Care Team: Juline Patch, MD as PCP - General (Family Medicine)  Indicate any recent Medical Services you may have received from other than [facility] providers in the past year (date may be approximate).    Assessment:   This is a routine wellness examination for Derek Figueroa.  Hearing/Vision screen  Hearing Screening   125Hz  250Hz  500Hz  1000Hz  2000Hz  3000Hz  4000Hz  6000Hz   8000Hz   Right ear:           Left ear:           Comments: Pt states he has had hearing evaluation in the past that recommend hearing aids and plans to look into them but overall states hearing is okay.   Vision Screening Comments: Annual vision screenings done at [place] issues and exercise activities discussed: Current Exercise Habits: Home exercise routine(working in the yard), Exercise limited by: None identified  Goals    . Weight (lb) < 170 lb (77.1 kg)     Pt would like to lose weight over the next year with healthy eating and physical activity       Depression Screen PHQ 2/9 Scores 08/24/2019 07/26/2019 05/13/2019 03/01/2019  PHQ - 2 Score 0 0 0 1  PHQ- 9 Score - 0 - 1    Fall Risk Fall Risk  08/24/2019 07/06/2018  Falls in the past year? 0 0  Number falls in past yr: 0 -  Injury with Fall? 0 -  Risk for fall due to : No Fall Risks -  Follow up Falls prevention discussed Falls evaluation completed    [place]:  Any stairs in or around the home? Yes  If so, do they handrails? Yes   Home free of loose  throw rugs in walkways, pet beds, electrical cords, etc? Yes  Adequate lighting in your home to reduce risk of falls? Yes   ASSISTIVE DEVICES UTILIZED TO PREVENT FALLS:  Life alert? No  Use of a cane, walker or w/c? No  Grab bars in the bathroom? Yes  Shower chair or bench in shower? No  Elevated toilet seat or a handicapped toilet? Yes   DME ORDERS:  DME order needed?  No   TIMED UP AND GO:  Was the test performed? No . Telephonic visit.   Education: Fall risk prevention has been discussed.  Intervention(s) required? No    Cognitive Function: 6CIT deferred for 2021 AWV; pt has no memory issues        Screening Tests Health Maintenance  Topic Date Due  . INFLUENZA VACCINE  08/24/2019 (Originally 12/25/2018)  . PNA vac Low Risk Adult (2 of 2 - PPSV23) 07/25/2020 (Originally 07/07/2019)  . TETANUS/TDAP  06/21/2023  . COLONOSCOPY  06/23/2024  . Hepatitis C Screening  Completed    Qualifies for Shingles Vaccine? YES. Due for Shingrix. Education has been provided regarding the importance of this vaccine. Pt has been advised to call insurance company to determine out of pocket expense. Advised may also receive vaccine at local pharmacy or Health Dept. Verbalized acceptance and understanding.  Tdap: Up to date  Flu Vaccine: Due for Flu vaccine. Does the patient want to receive this vaccine today?  No . Education has been provided regarding the importance of this vaccine but still declined. Advised may receive this vaccine at local pharmacy or Health Dept. Aware to provide a copy of the vaccination record if obtained from local pharmacy or Health Dept. Verbalized acceptance and understanding.  Pneumococcal Vaccine: Due for Pneumococcal vaccine PPSV23. Does the patient want to receive this vaccine today?  No . Education has been provided regarding the importance of this vaccine. Patient to receive at next office visit.   Cancer Screenings:  Colorectal Screening: Completed 06/23/14.  Repeat every 5 years. Referral to GI placed today. Pt aware the office will call re: appt.  Lung Cancer Screening: (Low  Dose CT Chest recommended if Age 44-80 years, 30 pack-year currently smoking OR have quit w/in 15years.) does not qualify.   Additional Screening:  Hepatitis C Screening: does qualify; Completed 09/23/13  Vision Screening: Recommended annual ophthalmology exams for early detection of glaucoma and other disorders of the eye. Is the patient up to date with their annual eye exam?  Yes  Who is the provider or what is the name of the office in which the pt attends annual eye exams? [place] Screening: Recommended annual dental exams for proper oral hygiene  Community Resource Referral:  CRR required this visit?  No       Plan:     I have personally reviewed and addressed the Medicare Annual Wellness questionnaire and have noted the following in the patient's chart:  A. Medical and social history B. Use of alcohol, tobacco or illicit drugs  C. Current medications and supplements D. Functional ability and status E.  Nutritional status F.  Physical activity G. Advance directives H. List of other physicians I.  Hospitalizations, surgeries, and ER visits in previous 12 months J.  [place] such as hearing and vision if needed, cognitive and depression L. Referrals and appointments   In addition, I have reviewed and discussed with patient certain preventive protocols, quality metrics, and best practice recommendations. A written personalized care plan for preventive services as well as general preventive health recommendations were provided to patient.   Signed,  Clemetine Marker, LPN Nurse Health Advisor   Nurse Notes: pt doing well and appreciative of visit today. Scheduled for CPE 08/30/19.

## 2019-08-26 ENCOUNTER — Encounter: Payer: Medicare PPO | Admitting: Family Medicine

## 2019-08-29 ENCOUNTER — Ambulatory Visit: Payer: Medicare HMO

## 2019-08-30 ENCOUNTER — Ambulatory Visit (INDEPENDENT_AMBULATORY_CARE_PROVIDER_SITE_OTHER): Payer: Medicare HMO | Admitting: Family Medicine

## 2019-08-30 ENCOUNTER — Other Ambulatory Visit: Payer: Self-pay

## 2019-08-30 ENCOUNTER — Encounter: Payer: Self-pay | Admitting: Family Medicine

## 2019-08-30 VITALS — BP 120/80 | HR 80 | Ht 68.0 in | Wt 177.0 lb

## 2019-08-30 DIAGNOSIS — Z0000 Encounter for general adult medical examination without abnormal findings: Secondary | ICD-10-CM | POA: Diagnosis not present

## 2019-08-30 DIAGNOSIS — E7800 Pure hypercholesterolemia, unspecified: Secondary | ICD-10-CM | POA: Diagnosis not present

## 2019-08-30 DIAGNOSIS — B351 Tinea unguium: Secondary | ICD-10-CM

## 2019-08-30 NOTE — Progress Notes (Signed)
Date:  08/30/2019   Name:  Derek Figueroa   DOB:  April 03, 1951   MRN:  [id number]   Chief Complaint: Annual Exam  Patient is a 69 year old male who presents for a comprehensive physical exam. The patient reports the following problems: onychomycosis. Health maintenance has been reviewed colonoscopy.   Lab Results  Component Value Date   CREATININE 0.97 03/01/2019   BUN 14 03/01/2019   NA 140 03/01/2019   K 4.3 03/01/2019   CL 104 03/01/2019   CO2 24 03/01/2019   Lab Results  Component Value Date   CHOL 195 08/20/2018   HDL 51 08/20/2018   LDLCALC 128 (H) 08/20/2018   TRIG 82 08/20/2018   CHOLHDL 3.8 08/20/2018   No results found for: TSH No results found for: HGBA1C Lab Results  Component Value Date   WBC 5.3 08/05/2017   HGB 15.9 08/05/2017   HCT 46.5 08/05/2017   MCV 84.3 08/05/2017   PLT 234 08/05/2017   Lab Results  Component Value Date   ALT 471 (H) 08/05/2017   AST 174 (H) 08/05/2017   ALKPHOS 208 (H) 08/05/2017   BILITOT 6.3 (H) 08/05/2017     Review of Systems  Constitutional: Negative for chills and fever.  HENT: Negative for drooling, ear discharge, ear pain and sore throat.   Respiratory: Negative for cough, shortness of breath and wheezing.   Cardiovascular: Negative for chest pain, palpitations and leg swelling.  Gastrointestinal: Negative for abdominal pain, blood in stool, constipation, diarrhea and nausea.  Endocrine: Negative for polydipsia.  Genitourinary: Negative for dysuria, frequency, hematuria and urgency.  Musculoskeletal: Negative for back pain, myalgias and neck pain.  Skin: Negative for rash.  Allergic/Immunologic: Negative for environmental allergies.  Neurological: Negative for dizziness and headaches.  Hematological: Does not bruise/bleed easily.  Psychiatric/Behavioral: Negative for suicidal ideas. The patient is not nervous/anxious.     Patient Active Problem List   Diagnosis Date Noted  . Cholecystitis, acute   .  Obstruction of bile duct   . Jaundice   . Abnormal findings on imaging of biliary tract   . Residual hemorrhoidal skin tags 06/23/2014  . Right shoulder pain 01/05/2014  . Gastroesophageal reflux disease without esophagitis 01/05/2014  . Elevated blood pressure 01/05/2014  . History of adenomatous polyp of colon 05/26/2006    No Known Allergies  Past Surgical History:  Procedure Laterality Date  . ERCP N/A 08/13/2017   Procedure: ENDOSCOPIC RETROGRADE CHOLANGIOPANCREATOGRAPHY (ERCP);  Surgeon: Lucilla Lame, MD;  Location: [facility] ENDOSCOPY;  Service: Endoscopy;  Laterality: N/A;  . EYE SURGERY    . gall stones    . HERNIA REPAIR    . ROBOTIC ASSISTED LAPAROSCOPIC CHOLECYSTECTOMY-MULTI SITE N/A 10/20/2017   Procedure: ROBOTIC ASSISTED LAPAROSCOPIC CHOLECYSTECTOMY-MULTI SITE;  Surgeon: Jules Husbands, MD;  Location: [facility] ORS;  Service: General;  Laterality: N/A;  . UMBILICAL HERNIA REPAIR N/A 10/20/2017   Procedure: HERNIA REPAIR UMBILICAL ADULT;  Surgeon: Jules Husbands, MD;  Location: [facility] ORS;  Service: General;  Laterality: N/A;    Social History   Tobacco Use  . Smoking status: Former Smoker    Quit date: 09/02/1978    Years since quitting: 41.0  . Smokeless tobacco: Never Used  . Tobacco comment: quit 40 yrs  Substance Use Topics  . Alcohol use: Yes    Alcohol/week: 3.0 standard drinks    Types: 3 Glasses of wine per week  . Drug use: No     Medication list has  been reviewed and updated.  Current Meds  Medication Sig  . Alum Hydroxide-Mag Carbonate (GAVISCON PO) Take 1 tablet by mouth daily as needed (for stomach).  . loratadine (CLARITIN) 10 MG tablet Take 10 mg by mouth daily.  . meloxicam (MOBIC) 15 MG tablet Take 1 tablet (15 mg total) by mouth daily.  . metoprolol succinate (TOPROL-XL) 50 MG 24 hr tablet Take 1 tablet (50 mg total) by mouth daily. Take with or immediately following a meal.  . Multiple Vitamins-Minerals (MULTIVITAMIN ADULT PO) Take 1 tablet by mouth  daily.   . Omega-3 Fatty Acids (FISH OIL PO) Take 1 capsule by mouth daily.     PHQ 2/9 Scores 08/30/2019 08/24/2019 07/26/2019 05/13/2019  PHQ - 2 Score 0 0 0 0  PHQ- 9 Score 0 - 0 -    BP Readings from Last 3 Encounters:  08/30/19 120/80  07/26/19 138/78  05/13/19 132/76    Physical Exam Vitals and nursing note reviewed.  Constitutional:      Appearance: Normal appearance. He is well-developed and overweight.  HENT:     Head: Normocephalic.     Jaw: There is normal jaw occlusion.     Right Ear: Hearing, tympanic membrane, ear canal and external ear normal.     Left Ear: Hearing, tympanic membrane, ear canal and external ear normal.     Nose: Nose normal.     Mouth/Throat:     Lips: Pink.     Mouth: Mucous membranes are moist.     Dentition: Normal dentition.     Pharynx: Oropharynx is clear. Uvula midline.  Eyes:     General: Lids are normal. Vision grossly intact. Gaze aligned appropriately. No scleral icterus.       Right eye: No discharge.        Left eye: No discharge.     Extraocular Movements: Extraocular movements intact.     Conjunctiva/sclera: Conjunctivae normal.     Pupils: Pupils are equal, round, and reactive to light.     Funduscopic exam:    Right eye: Red reflex present.        Left eye: Red reflex present. Neck:     Thyroid: No thyroid mass, thyromegaly or thyroid tenderness.     Vascular: No JVD.     Trachea: No tracheal deviation.  Cardiovascular:     Rate and Rhythm: Normal rate and regular rhythm.     Chest Wall: PMI is not displaced.     Pulses: Normal pulses.          Carotid pulses are 2+ on the right side and 2+ on the left side.      Radial pulses are 2+ on the right side and 2+ on the left side.       Femoral pulses are 2+ on the right side and 2+ on the left side.      Popliteal pulses are 2+ on the right side and 2+ on the left side.       Dorsalis pedis pulses are 2+ on the right side and 2+ on the left side.       Posterior tibial  pulses are 2+ on the right side and 2+ on the left side.     Heart sounds: Normal heart sounds, S1 normal and S2 normal. No murmur. No systolic murmur. No diastolic murmur. No friction rub. No gallop. No S3 or S4 sounds.   Pulmonary:     Effort: Pulmonary effort is normal. No respiratory distress.  Breath sounds: Normal breath sounds. No decreased breath sounds, wheezing, rhonchi or rales.  Chest:     Chest wall: No mass.     Breasts: Breasts are symmetrical.        Right: Normal.        Left: Normal.  Abdominal:     General: Abdomen is protuberant. Bowel sounds are normal.     Palpations: Abdomen is soft. There is no hepatomegaly, splenomegaly or mass.     Tenderness: There is no abdominal tenderness. There is no guarding or rebound.  Genitourinary:    Penis: Normal.      Testes: Normal.     Prostate: Normal. Not enlarged, not tender and no nodules present.     Rectum: Normal. Guaiac result negative. No mass.  Musculoskeletal:        General: No tenderness. Normal range of motion.     Cervical back: Normal, full passive range of motion without pain, normal range of motion and neck supple.     Thoracic back: Normal.     Lumbar back: Normal.     Right lower leg: No edema.     Left lower leg: No edema.  Feet:     Right foot:     Skin integrity: Dry skin present.     Toenail Condition: Right toenails are abnormally thick. Fungal disease present.    Left foot:     Skin integrity: Dry skin present.     Toenail Condition: Left toenails are abnormally thick. Fungal disease present. Lymphadenopathy:     Head:     Right side of head: No submental or submandibular adenopathy.     Left side of head: No submental or submandibular adenopathy.     Cervical: No cervical adenopathy.     Right cervical: No superficial or deep cervical adenopathy.    Left cervical: No superficial or deep cervical adenopathy.     Upper Body:     Right upper body: No supraclavicular adenopathy.     Left  upper body: No supraclavicular adenopathy.  Skin:    General: Skin is warm.     Capillary Refill: Capillary refill takes less than 2 seconds.     Findings: No rash.  Neurological:     General: No focal deficit present.     Mental Status: He is alert and oriented to person, place, and time.     Cranial Nerves: Cranial nerves are intact. No cranial nerve deficit or facial asymmetry.     Sensory: Sensation is intact.     Deep Tendon Reflexes: Reflexes are normal and symmetric.     Reflex Scores:      Tricep reflexes are 2+ on the right side and 2+ on the left side.      Bicep reflexes are 2+ on the right side and 2+ on the left side.      Brachioradialis reflexes are 2+ on the right side and 2+ on the left side.      Patellar reflexes are 2+ on the right side and 2+ on the left side.      Achilles reflexes are 2+ on the right side and 2+ on the left side. Psychiatric:        Attention and Perception: Attention and perception normal.        Mood and Affect: Mood and affect normal.        Speech: Speech normal.        Behavior: Behavior is cooperative.  Cognition and Memory: Cognition and memory normal.     Wt Readings from Last 3 Encounters:  08/30/19 177 lb (80.3 kg)  07/26/19 175 lb (79.4 kg)  05/13/19 179 lb (81.2 kg)    BP 120/80   Pulse 80   Ht 5\' 8"  (1.727 m)   Wt 177 lb (80.3 kg)   BMI 26.91 kg/m   Assessment and Plan:  1. Annual physical exam No subjective/objective concerns noted during the history and physical exam.  Patient's previous encounters, most recent labs, most recent imaging, and care everywhere reviewed.Derek Figueroa is a 69 y.o. male who presents today for his Complete Annual Exam. He feels well. He reports exercising occasional. He reports he is sleeping well. Immunizations are reviewed and recommendations provided.   Age appropriate screening tests are discussed. Counseling given for risk factor reduction interventions. - Lipid Panel With  LDL/HDL Ratio - Renal Function Panel  2. Onychomycosis Patient desires to see podiatry because of thickened nails and suggestion of fungal disease.  This may also be due to dystrophic nails and we will have podiatry evaluate for either removal or treatment of the fungal disease. - Ambulatory referral to Podiatry  3. Elevated LDL cholesterol level Chronic.  Controlled.  Stable.  Elevated LDL.  Will check lipid panel to evaluate current status.

## 2019-08-31 ENCOUNTER — Ambulatory Visit (INDEPENDENT_AMBULATORY_CARE_PROVIDER_SITE_OTHER): Payer: Self-pay | Admitting: Gastroenterology

## 2019-08-31 DIAGNOSIS — Z1211 Encounter for screening for malignant neoplasm of colon: Secondary | ICD-10-CM

## 2019-08-31 LAB — RENAL FUNCTION PANEL
Albumin: 4.3 g/dL (ref 3.8–4.8)
BUN/Creatinine Ratio: 20 (ref 10–24)
BUN: 18 mg/dL (ref 8–27)
CO2: 23 mmol/L (ref 20–29)
Calcium: 9.6 mg/dL (ref 8.6–10.2)
Chloride: 104 mmol/L (ref 96–106)
Creatinine, Ser: 0.91 mg/dL (ref 0.76–1.27)
GFR calc Af Amer: 101 mL/min/{1.73_m2} (ref >59)
GFR calc non Af Amer: 88 mL/min/{1.73_m2} (ref >59)
Glucose: 107 mg/dL — ABNORMAL HIGH (ref 65–99)
Phosphorus: 2.6 mg/dL — ABNORMAL LOW (ref 2.8–4.1)
Potassium: 4.8 mmol/L (ref 3.5–5.2)
Sodium: 141 mmol/L (ref 134–144)

## 2019-08-31 LAB — LIPID PANEL WITH LDL/HDL RATIO
Cholesterol, Total: 206 mg/dL — ABNORMAL HIGH (ref 100–199)
HDL: 54 mg/dL (ref >39)
LDL Chol Calc (NIH): 133 mg/dL — ABNORMAL HIGH (ref 0–99)
LDL/HDL Ratio: 2.5 ratio (ref 0.0–3.6)
Triglycerides: 109 mg/dL (ref 0–149)
VLDL Cholesterol Cal: 19 mg/dL (ref 5–40)

## 2019-08-31 MED ORDER — NA SULFATE-K SULFATE-MG SULF 17.5-3.13-1.6 GM/177ML PO SOLN: 1.0000 | Freq: Once | ORAL | 0 refills | Status: AC

## 2019-08-31 NOTE — Progress Notes (Signed)
Gastroenterology Pre-Procedure Review  Request Date: Tuesday 10/11/19 Requesting Physician: Dr. Allen Norris  PATIENT REVIEW QUESTIONS: The patient responded to the following health history questions as indicated:    1. Are you having any GI issues? no 2. Do you have a personal history of Polyps? yes (patient states 5 years ago colon polyps) 3. Do you have a family history of Colon Cancer or Polyps? no 4. Diabetes Mellitus? no 5. Joint replacements in the past 12 months?no 6. Major health problems in the past 3 months?no 7. Any artificial heart valves, MVP, or defibrillator?no    MEDICATIONS & ALLERGIES:    Patient reports the following regarding taking any anticoagulation/antiplatelet therapy:   Plavix, Coumadin, Eliquis, Xarelto, Lovenox, Pradaxa, Brilinta, or Effient? no Aspirin? no  Patient confirms/reports the following medications:  Current Outpatient Medications  Medication Sig Dispense Refill  . Alum Hydroxide-Mag Carbonate (GAVISCON PO) Take 1 tablet by mouth daily as needed (for stomach).    . loratadine (CLARITIN) 10 MG tablet Take 10 mg by mouth daily.    . meloxicam (MOBIC) 15 MG tablet Take 1 tablet (15 mg total) by mouth daily. 30 tablet 2  . metoprolol succinate (TOPROL-XL) 50 MG 24 hr tablet Take 1 tablet (50 mg total) by mouth daily. Take with or immediately following a meal. 180 tablet 1  . Multiple Vitamins-Minerals (MULTIVITAMIN ADULT PO) Take 1 tablet by mouth daily.     . Na Sulfate-K Sulfate-Mg Sulf 17.5-3.13-1.6 GM/177ML SOLN Take 1 kit by mouth once for 1 dose. 354 mL 0  . Omega-3 Fatty Acids (FISH OIL PO) Take 1 capsule by mouth daily.      No current facility-administered medications for this visit.    Patient confirms/reports the following allergies:  No Known Allergies  No orders of the defined types were placed in this encounter.   AUTHORIZATION INFORMATION Primary Insurance: 1D#: Group #:  Secondary Insurance: 1D#: Group #:  SCHEDULE  INFORMATION: Date: 10/11/19 Time: Location:[facility]

## 2019-10-07 ENCOUNTER — Other Ambulatory Visit: Payer: Self-pay

## 2019-10-07 ENCOUNTER — Other Ambulatory Visit
Admission: RE | Admit: 2019-10-07 | Discharge: 2019-10-07 | Disposition: A | Discharge status: Home / Self Care | Payer: Medicare HMO | Source: Ambulatory Visit | Attending: Gastroenterology | Admitting: Gastroenterology

## 2019-10-07 DIAGNOSIS — Z01812 Encounter for preprocedural laboratory examination: Secondary | ICD-10-CM | POA: Diagnosis present

## 2019-10-07 DIAGNOSIS — Z20822 Contact with and (suspected) exposure to covid-19: Secondary | ICD-10-CM | POA: Diagnosis not present

## 2019-10-07 LAB — SARS CORONAVIRUS 2 (TAT 6-24 HRS): SARS Coronavirus 2: NEGATIVE

## 2019-10-11 ENCOUNTER — Ambulatory Visit
Admission: RE | Admit: 2019-10-11 | Discharge: 2019-10-11 | Disposition: A | Discharge status: Home / Self Care | Payer: Medicare HMO | Attending: Gastroenterology | Admitting: Gastroenterology

## 2019-10-11 ENCOUNTER — Encounter
Admission: RE | Disposition: A | Discharge status: Home / Self Care | Payer: Self-pay | Source: Home / Self Care | Attending: Gastroenterology

## 2019-10-11 ENCOUNTER — Other Ambulatory Visit: Payer: Self-pay

## 2019-10-11 ENCOUNTER — Encounter: Payer: Self-pay | Admitting: Gastroenterology

## 2019-10-11 ENCOUNTER — Ambulatory Visit: Payer: Medicare HMO | Admitting: Anesthesiology

## 2019-10-11 DIAGNOSIS — D123 Benign neoplasm of transverse colon: Secondary | ICD-10-CM | POA: Insufficient documentation

## 2019-10-11 DIAGNOSIS — I10 Essential (primary) hypertension: Secondary | ICD-10-CM | POA: Insufficient documentation

## 2019-10-11 DIAGNOSIS — Z87891 Personal history of nicotine dependence: Secondary | ICD-10-CM | POA: Insufficient documentation

## 2019-10-11 DIAGNOSIS — Z79899 Other long term (current) drug therapy: Secondary | ICD-10-CM | POA: Insufficient documentation

## 2019-10-11 DIAGNOSIS — K635 Polyp of colon: Secondary | ICD-10-CM

## 2019-10-11 DIAGNOSIS — Z1211 Encounter for screening for malignant neoplasm of colon: Secondary | ICD-10-CM | POA: Diagnosis present

## 2019-10-11 DIAGNOSIS — K219 Gastro-esophageal reflux disease without esophagitis: Secondary | ICD-10-CM | POA: Insufficient documentation

## 2019-10-11 DIAGNOSIS — Z791 Long term (current) use of non-steroidal anti-inflammatories (NSAID): Secondary | ICD-10-CM | POA: Diagnosis not present

## 2019-10-11 DIAGNOSIS — Z860100 Personal history of colon polyps, unspecified: Secondary | ICD-10-CM

## 2019-10-11 DIAGNOSIS — Z9049 Acquired absence of other specified parts of digestive tract: Secondary | ICD-10-CM | POA: Diagnosis not present

## 2019-10-11 DIAGNOSIS — K640 First degree hemorrhoids: Secondary | ICD-10-CM | POA: Diagnosis not present

## 2019-10-11 HISTORY — PX: COLONOSCOPY WITH PROPOFOL: SHX5780

## 2019-10-11 HISTORY — DX: Essential (primary) hypertension: I10

## 2019-10-11 SURGERY — COLONOSCOPY WITH PROPOFOL
Anesthesia: General

## 2019-10-11 MED ORDER — PROPOFOL 10 MG/ML IV BOLUS
INTRAVENOUS | Status: DC | PRN
  Administered 2019-10-11: 80 mg via INTRAVENOUS

## 2019-10-11 MED ORDER — PROPOFOL 500 MG/50ML IV EMUL
INTRAVENOUS | Status: AC
  Filled 2019-10-11: qty 50

## 2019-10-11 MED ORDER — LIDOCAINE 2% (20 MG/ML) 5 ML SYRINGE
INTRAMUSCULAR | Status: DC | PRN
  Administered 2019-10-11: 50 mg via INTRAVENOUS

## 2019-10-11 MED ORDER — PROPOFOL 500 MG/50ML IV EMUL
INTRAVENOUS | Status: DC | PRN
  Administered 2019-10-11: 150 ug/kg/min via INTRAVENOUS

## 2019-10-11 MED ORDER — SODIUM CHLORIDE 0.9 % IV SOLN: INTRAVENOUS | Status: DC

## 2019-10-11 NOTE — Transfer of Care (Signed)
Immediate Anesthesia Transfer of Care Note  Patient: Derek Figueroa  Procedure(s) Performed: COLONOSCOPY WITH PROPOFOL (N/A )  Patient Location: PACU  Anesthesia Type:General  Level of Consciousness: awake  Airway & Oxygen Therapy: Patient Spontanous Breathing  Post-op Assessment: Post -op Vital signs reviewed and stable  Post vital signs: stable  Last Vitals:  Vitals Value Taken Time  BP 107/58 10/11/19 0850  Temp    Pulse 75 10/11/19 0850  Resp 17 10/11/19 0850  SpO2 97 % 10/11/19 0850  Vitals shown include unvalidated device data.  Last Pain:  Vitals:   10/11/19 0802  TempSrc: Temporal  PainSc: 0-No pain         Complications: No apparent anesthesia complications

## 2019-10-11 NOTE — Op Note (Signed)
[facility] Gastroenterology Patient Name: Derek Figueroa Procedure Date: 10/11/2019 8:29 AM MRN: [id number] Account #: [id number] Date of Birth: [birth year] Admit Type: Outpatient Age: 69 Room: [facility] ENDO ROOM 4 Gender: Male Note Status: Finalized Procedure:             Colonoscopy Indications:           High risk colon cancer surveillance: Personal history                         of colonic polyps Providers:             Lucilla Lame MD, MD Referring MD:          Juline Patch, MD (Referring MD) Medicines:             Propofol per Anesthesia Complications:         No immediate complications. Procedure:             Pre-Anesthesia Assessment:                        - Prior to the procedure, a History and Physical was                         performed, and patient medications and allergies were                         reviewed. The patient's tolerance of previous                         anesthesia was also reviewed. The risks and benefits                         of the procedure and the sedation options and risks                         were discussed with the patient. All questions were                         answered, and informed consent was obtained. Prior                         Anticoagulants: The patient has taken no previous                         anticoagulant or antiplatelet agents. ASA Grade                         Assessment: II - A patient with mild systemic disease.                         After reviewing the risks and benefits, the patient                         was deemed in satisfactory condition to undergo the                         procedure.  After obtaining informed consent, the colonoscope was                         passed under direct vision. Throughout the procedure,                         the patient's blood pressure, pulse, and oxygen                         saturations were monitored continuously. The               Colonoscope was introduced through the anus and                         advanced to the the cecum, identified by appendiceal                         orifice and ileocecal valve. The colonoscopy was                         performed without difficulty. The patient tolerated                         the procedure well. The quality of the bowel                         preparation was excellent. Findings:      The perianal and digital rectal examinations were normal.      A 2 mm polyp was found in the transverse colon. The polyp was sessile.       The polyp was removed with a cold biopsy forceps. Resection and       retrieval were complete.      Non-bleeding internal hemorrhoids were found during retroflexion. The       hemorrhoids were Grade I (internal hemorrhoids that do not prolapse). Impression:            - One 2 mm polyp in the transverse colon, removed with                         a cold biopsy forceps. Resected and retrieved.                        - Non-bleeding internal hemorrhoids. Recommendation:        - Discharge patient to home.                        - Resume previous diet.                        - Continue present medications.                        - Await pathology results.                        - Repeat colonoscopy in 5 years for surveillance. Procedure Code(s):     --- Professional ---                        [phone], Colonoscopy, flexible;  with biopsy, single or                         multiple Diagnosis Code(s):     --- Professional ---                        Z86.010, Personal history of colonic polyps                        K63.5, Polyp of colon CPT copyright 2019 American Medical Association. All rights reserved. The codes documented in this report are preliminary and upon coder review may  be revised to meet current compliance requirements. Lucilla Lame MD, MD 10/11/2019 8:48:53 AM This report has been signed electronically. Number of Addenda: 0 Note  Initiated On: 10/11/2019 8:29 AM Scope Withdrawal Time: 0 hours 7 minutes 3 seconds  Total Procedure Duration: 0 hours 8 minutes 26 seconds  Estimated Blood Loss:  Estimated blood loss: none.      [facility]

## 2019-10-11 NOTE — Addendum Note (Signed)
Addendum  created 10/11/19 1156 by Viona Hosking, CRNA   Charge Capture section accepted    

## 2019-10-11 NOTE — Anesthesia Postprocedure Evaluation (Signed)
Anesthesia Post Note  Patient: Derek Figueroa  Procedure(s) Performed: COLONOSCOPY WITH PROPOFOL (N/A )  Patient location during evaluation: PACU Anesthesia Type: General Level of consciousness: awake and alert Pain management: pain level controlled Vital Signs Assessment: post-procedure vital signs reviewed and stable Respiratory status: spontaneous breathing, nonlabored ventilation, respiratory function stable and patient connected to nasal cannula oxygen Cardiovascular status: blood pressure returned to baseline and stable Postop Assessment: no apparent nausea or vomiting Anesthetic complications: no     Last Vitals:  Vitals:   10/11/19 0920 10/11/19 0930  BP: 126/76 118/67  Pulse: (!) 57 (!) 57  Resp:    Temp:    SpO2: 100% 100%    Last Pain:  Vitals:   10/11/19 0802  TempSrc: Temporal  PainSc: 0-No pain                 Molli Barrows

## 2019-10-11 NOTE — Anesthesia Preprocedure Evaluation (Signed)
Anesthesia Evaluation  Patient identified by MRN, date of birth, ID band Patient awake    Reviewed: Allergy & Precautions, H&P , NPO status , Patient's Chart, lab work & pertinent test results, reviewed documented beta blocker date and time   Airway Mallampati: II   Neck ROM: full    Dental  (+) Poor Dentition   Pulmonary neg pulmonary ROS, former smoker,    Pulmonary exam normal        Cardiovascular Exercise Tolerance: Good hypertension, On Medications negative cardio ROS Normal cardiovascular exam+ Valvular Problems/Murmurs  Rhythm:regular Rate:Normal     Neuro/Psych negative neurological ROS  negative psych ROS   GI/Hepatic Neg liver ROS, GERD  Medicated,  Endo/Other  negative endocrine ROS  Renal/GU negative Renal ROS  negative genitourinary   Musculoskeletal   Abdominal   Peds  Hematology negative hematology ROS (+)   Anesthesia Other Findings Past Medical History: No date: Heart murmur No date: Hypertension Past Surgical History: No date: COLONOSCOPY 08/13/2017: ERCP; N/A     Comment:  Procedure: ENDOSCOPIC RETROGRADE               CHOLANGIOPANCREATOGRAPHY (ERCP);  Surgeon: Lucilla Lame,               MD;  Location: [facility] ENDOSCOPY;  Service: Endoscopy;                Laterality: N/A; No date: EYE SURGERY No date: gall stones No date: HERNIA REPAIR 10/20/2017: ROBOTIC ASSISTED LAPAROSCOPIC CHOLECYSTECTOMY-MULTI SITE;  N/A     Comment:  Procedure: ROBOTIC ASSISTED LAPAROSCOPIC               CHOLECYSTECTOMY-MULTI SITE;  Surgeon: Jules Husbands, MD;              Location: [facility] ORS;  Service: General;  Laterality: N/A; [id number]: UMBILICAL HERNIA REPAIR; N/A     Comment:  Procedure: HERNIA REPAIR UMBILICAL ADULT;  Surgeon:               Jules Husbands, MD;  Location: [facility] ORS;  Service:               General;  Laterality: N/A; BMI    Body Mass Index: 27.37 kg/m     Reproductive/Obstetrics negative  OB ROS                             Anesthesia Physical Anesthesia Plan  ASA: II  Anesthesia Plan: General   Post-op Pain Management:    Induction:   PONV Risk Score and Plan:   Airway Management Planned:   Additional Equipment:   Intra-op Plan:   Post-operative Plan:   Informed Consent: I have reviewed the patients History and Physical, chart, labs and discussed the procedure including the risks, benefits and alternatives for the proposed anesthesia with the patient or authorized representative who has indicated his/her understanding and acceptance.     Dental Advisory Given  Plan Discussed with: CRNA  Anesthesia Plan Comments:         Anesthesia Quick Evaluation

## 2019-10-11 NOTE — H&P (Signed)
Lucilla Lame, MD [address] [place],  [address] [phone] Fax : [phone]  Primary Care Physician:  Juline Patch, MD Primary Gastroenterologist:  Dr. Allen Norris  Pre-Procedure History & Physical: HPI:  Derek Figueroa is a 69 y.o. male is here for an colonoscopy.   Past Medical History:  Diagnosis Date  . Heart murmur   . Hypertension     Past Surgical History:  Procedure Laterality Date  . COLONOSCOPY    . ERCP N/A 08/13/2017   Procedure: ENDOSCOPIC RETROGRADE CHOLANGIOPANCREATOGRAPHY (ERCP);  Surgeon: Lucilla Lame, MD;  Location: [facility] ENDOSCOPY;  Service: Endoscopy;  Laterality: N/A;  . EYE SURGERY    . gall stones    . HERNIA REPAIR    . ROBOTIC ASSISTED LAPAROSCOPIC CHOLECYSTECTOMY-MULTI SITE N/A 10/20/2017   Procedure: ROBOTIC ASSISTED LAPAROSCOPIC CHOLECYSTECTOMY-MULTI SITE;  Surgeon: Jules Husbands, MD;  Location: [facility] ORS;  Service: General;  Laterality: N/A;  . UMBILICAL HERNIA REPAIR N/A 10/20/2017   Procedure: HERNIA REPAIR UMBILICAL ADULT;  Surgeon: Jules Husbands, MD;  Location: [facility] ORS;  Service: General;  Laterality: N/A;    Prior to Admission medications   Medication Sig Start Date End Date Taking? Authorizing Provider  metoprolol succinate (TOPROL-XL) 50 MG 24 hr tablet Take 1 tablet (50 mg total) by mouth daily. Take with or immediately following a meal. 03/01/19  Yes Juline Patch, MD  Multiple Vitamins-Minerals (MULTIVITAMIN ADULT PO) Take 1 tablet by mouth daily.    Yes [provider]  Omega-3 Fatty Acids (FISH OIL PO) Take 1 capsule by mouth daily.    Yes [provider]  Alum Hydroxide-Mag Carbonate (GAVISCON PO) Take 1 tablet by mouth daily as needed (for stomach).    [provider]  loratadine (CLARITIN) 10 MG tablet Take 10 mg by mouth daily.    [provider]  meloxicam (MOBIC) 15 MG tablet Take 1 tablet (15 mg total) by mouth daily. 07/26/19   Juline Patch, MD     Allergies as of 09/01/2019  . (No Known Allergies)    Family History  Problem Relation Age of Onset  . Heart disease Mother   . Diabetes Mother   . Stroke Mother   . Cancer Mother   . Heart disease Father   . COPD Father   . Stroke Father   . Lung cancer Brother   . COPD Brother   . Cancer Brother   . Cancer Maternal Grandmother   . Heart disease Maternal Grandmother   . Stroke Maternal Grandmother   . Cancer Maternal Grandfather   . Diabetes Maternal Grandfather   . Heart disease Maternal Grandfather     Social History   Socioeconomic History  . Marital status: Married    Spouse name: Not on file  . Number of children: Not on file  . Years of education: Not on file  . Highest education level: Not on file  Occupational History  . Not on file  Tobacco Use  . Smoking status: Former Smoker    Quit date: 09/02/1978    Years since quitting: 41.1  . Smokeless tobacco: Never Used  . Tobacco comment: quit 40 yrs  Substance and Sexual Activity  . Alcohol use: Yes    Alcohol/week: 3.0 standard drinks    Types: 3 Glasses of wine per week    Comment: none last 24hrs  . Drug use: No  . Sexual activity: Yes  Other Topics Concern  . Not on file  Social History Narrative  . Not on file   Social Determinants of Health   Financial Resource Strain: Low Risk   . Difficulty of Paying Living Expenses: Not hard at all  Food Insecurity: No Food Insecurity  . Worried About Charity fundraiser in the Last Year: Never true  . Ran Out of Food in the Last Year: Never true  Transportation Needs: No Transportation Needs  . Lack of Transportation (Medical): No  . Lack of Transportation (Non-Medical): No  Physical Activity: Inactive  . Days of Exercise per Week: 0 days  . Minutes of Exercise per Session: 0 min  Stress: No Stress Concern Present  . Feeling of Stress : Not at all  Social Connections: Slightly Isolated  . Frequency of Communication with Friends and Family: More  than three times a week  . Frequency of Social Gatherings with Friends and Family: Once a week  . Attends Religious Services: More than 4 times per year  . Active Member of Clubs or Organizations: No  . Attends Archivist Meetings: Never  . Marital Status: Married  Human resources officer Violence: Not At Risk  . Fear of Current or Ex-Partner: No  . Emotionally Abused: No  . Physically Abused: No  . Sexually Abused: No    Review of Systems: See HPI, otherwise negative ROS  Physical Exam: BP (!) 148/95   Pulse 67   Temp 97.8 F (36.6 C) (Temporal)   Resp 20   Ht 5\' 8"  (1.727 m)   Wt 81.6 kg   SpO2 100%   BMI 27.37 kg/m  General:   Alert,  pleasant and cooperative in NAD Head:  Normocephalic and atraumatic. Neck:  Supple; no masses or thyromegaly. Lungs:  Clear throughout to auscultation.    Heart:  Regular rate and rhythm. Abdomen:  Soft, nontender and nondistended. Normal bowel sounds, without guarding, and without rebound.   Neurologic:  Alert and  oriented x4;  grossly normal neurologically.  Impression/Plan: Derek Figueroa is here for an colonoscopy to be performed for history of adenomatous polyps 5 years ago  Risks, benefits, limitations, and alternatives regarding  colonoscopy have been reviewed with the patient.  Questions have been answered.  All parties agreeable.   Lucilla Lame, MD  10/11/2019, 8:26 AM

## 2019-10-12 ENCOUNTER — Encounter: Payer: Self-pay | Admitting: *Deleted

## 2019-10-12 LAB — SURGICAL PATHOLOGY

## 2019-10-13 ENCOUNTER — Encounter: Payer: Self-pay | Admitting: Gastroenterology

## 2019-10-26 ENCOUNTER — Other Ambulatory Visit: Payer: Self-pay | Admitting: Family Medicine

## 2019-10-26 DIAGNOSIS — M25559 Pain in unspecified hip: Secondary | ICD-10-CM

## 2019-10-26 MED ORDER — MELOXICAM 15 MG PO TABS: 15.0000 mg | ORAL_TABLET | Freq: Every day | ORAL | 3 refills | Status: DC

## 2019-10-26 NOTE — Telephone Encounter (Signed)
Medication Refill - Medication: meloxicam   Has the patient contacted their pharmacy? Yes.   (Agent: If no, request that the patient contact the pharmacy for the refill.) (Agent: If yes, when and what did the pharmacy advise?)  Preferred Pharmacy (with phone number or street name):  [facility]  [place] [address]  Phone: [phone] Fax: [phone]  Not a 24 hour pharmacy; exact hours not known.     Agent: Please be advised that RX refills may take up to 3 business days. We ask that you follow-up with your pharmacy.

## 2019-12-27 DIAGNOSIS — B351 Tinea unguium: Secondary | ICD-10-CM | POA: Diagnosis not present

## 2019-12-27 DIAGNOSIS — L600 Ingrowing nail: Secondary | ICD-10-CM | POA: Diagnosis not present

## 2020-01-24 DIAGNOSIS — H40029 Open angle with borderline findings, high risk, unspecified eye: Secondary | ICD-10-CM | POA: Diagnosis not present

## 2020-01-24 DIAGNOSIS — H40053 Ocular hypertension, bilateral: Secondary | ICD-10-CM | POA: Diagnosis not present

## 2020-01-26 DIAGNOSIS — L600 Ingrowing nail: Secondary | ICD-10-CM | POA: Diagnosis not present

## 2020-01-26 DIAGNOSIS — B351 Tinea unguium: Secondary | ICD-10-CM | POA: Diagnosis not present

## 2020-02-16 DIAGNOSIS — L600 Ingrowing nail: Secondary | ICD-10-CM | POA: Diagnosis not present

## 2020-02-16 DIAGNOSIS — B351 Tinea unguium: Secondary | ICD-10-CM | POA: Diagnosis not present

## 2020-03-27 ENCOUNTER — Other Ambulatory Visit: Payer: Self-pay | Admitting: Family Medicine

## 2020-03-27 DIAGNOSIS — I10 Essential (primary) hypertension: Secondary | ICD-10-CM

## 2020-03-27 DIAGNOSIS — G250 Essential tremor: Secondary | ICD-10-CM

## 2020-04-03 DIAGNOSIS — B351 Tinea unguium: Secondary | ICD-10-CM | POA: Diagnosis not present

## 2020-04-03 DIAGNOSIS — M79675 Pain in left toe(s): Secondary | ICD-10-CM | POA: Diagnosis not present

## 2020-04-03 DIAGNOSIS — M79674 Pain in right toe(s): Secondary | ICD-10-CM | POA: Diagnosis not present

## 2020-04-03 DIAGNOSIS — L600 Ingrowing nail: Secondary | ICD-10-CM | POA: Diagnosis not present

## 2020-04-05 ENCOUNTER — Other Ambulatory Visit: Payer: Self-pay

## 2020-04-05 ENCOUNTER — Ambulatory Visit (INDEPENDENT_AMBULATORY_CARE_PROVIDER_SITE_OTHER): Payer: Medicare HMO | Admitting: Family Medicine

## 2020-04-05 ENCOUNTER — Encounter: Payer: Self-pay | Admitting: Family Medicine

## 2020-04-05 DIAGNOSIS — M25559 Pain in unspecified hip: Secondary | ICD-10-CM

## 2020-04-05 DIAGNOSIS — G250 Essential tremor: Secondary | ICD-10-CM

## 2020-04-05 DIAGNOSIS — I10 Essential (primary) hypertension: Secondary | ICD-10-CM | POA: Diagnosis not present

## 2020-04-05 MED ORDER — METOPROLOL SUCCINATE ER 50 MG PO TB24: ORAL_TABLET | ORAL | 1 refills | Status: DC

## 2020-04-05 MED ORDER — MELOXICAM 15 MG PO TABS: 15.0000 mg | ORAL_TABLET | Freq: Every day | ORAL | 3 refills | Status: DC

## 2020-04-05 NOTE — Progress Notes (Addendum)
Date:  04/05/2020   Name:  Derek Figueroa   DOB:  1950-11-23   MRN:  [id number]   Chief Complaint: Hypertension  Hypertension This is a chronic problem. The current episode started more than 1 year ago. The problem has been gradually improving since onset. The problem is controlled. Pertinent negatives include no anxiety, blurred vision, chest pain, headaches, malaise/fatigue, neck pain, orthopnea, palpitations, peripheral edema, PND, shortness of breath or sweats. There are no associated agents to hypertension. There are no known risk factors for coronary artery disease. Past treatments include beta blockers. The current treatment provides moderate improvement. There are no compliance problems.  There is no history of angina, kidney disease, CAD/MI, CVA, heart failure, left ventricular hypertrophy, PVD or retinopathy. There is no history of chronic renal disease, a hypertension causing med or renovascular disease.  Hip Pain  The incident occurred more than 1 week ago. There was no injury mechanism. He has tried NSAIDs for the symptoms.    Lab Results  Component Value Date   CREATININE 0.91 08/30/2019   BUN 18 08/30/2019   NA 141 08/30/2019   K 4.8 08/30/2019   CL 104 08/30/2019   CO2 23 08/30/2019   Lab Results  Component Value Date   CHOL 206 (H) 08/30/2019   HDL 54 08/30/2019   LDLCALC 133 (H) 08/30/2019   TRIG 109 08/30/2019   CHOLHDL 3.8 08/20/2018   No results found for: TSH No results found for: HGBA1C Lab Results  Component Value Date   WBC 5.3 08/05/2017   HGB 15.9 08/05/2017   HCT 46.5 08/05/2017   MCV 84.3 08/05/2017   PLT 234 08/05/2017   Lab Results  Component Value Date   ALT 471 (H) 08/05/2017   AST 174 (H) 08/05/2017   ALKPHOS 208 (H) 08/05/2017   BILITOT 6.3 (H) 08/05/2017     Review of Systems  Constitutional: Negative for chills, fever and malaise/fatigue.  HENT: Negative for drooling, ear discharge, ear pain and sore throat.   Eyes:  Negative for blurred vision.  Respiratory: Negative for cough, shortness of breath and wheezing.   Cardiovascular: Negative for chest pain, palpitations, orthopnea, leg swelling and PND.  Gastrointestinal: Negative for abdominal pain, blood in stool, constipation, diarrhea and nausea.  Endocrine: Negative for polydipsia.  Genitourinary: Negative for dysuria, frequency, hematuria and urgency.  Musculoskeletal: Negative for back pain, myalgias and neck pain.  Skin: Negative for rash.  Allergic/Immunologic: Negative for environmental allergies.  Neurological: Negative for dizziness and headaches.  Hematological: Does not bruise/bleed easily.  Psychiatric/Behavioral: Negative for suicidal ideas. The patient is not nervous/anxious.     Patient Active Problem List   Diagnosis Date Noted  . Personal history of colonic polyps   . Polyp of transverse colon   . Cholecystitis, acute   . Obstruction of bile duct   . Jaundice   . Abnormal findings on imaging of biliary tract   . Residual hemorrhoidal skin tags 06/23/2014  . Right shoulder pain 01/05/2014  . Gastroesophageal reflux disease without esophagitis 01/05/2014  . Elevated blood pressure 01/05/2014  . History of adenomatous polyp of colon 05/26/2006    No Known Allergies  Past Surgical History:  Procedure Laterality Date  . COLONOSCOPY    . COLONOSCOPY WITH PROPOFOL N/A 10/11/2019   Procedure: COLONOSCOPY WITH PROPOFOL;  Surgeon: Lucilla Lame, MD;  Location: [facility] ENDOSCOPY;  Service: Endoscopy;  Laterality: N/A;  . ERCP N/A 08/13/2017   Procedure: ENDOSCOPIC RETROGRADE CHOLANGIOPANCREATOGRAPHY (ERCP);  Surgeon: Allen Norris,  Darren, MD;  Location: [facility] ENDOSCOPY;  Service: Endoscopy;  Laterality: N/A;  . EYE SURGERY    . gall stones    . HERNIA REPAIR    . ROBOTIC ASSISTED LAPAROSCOPIC CHOLECYSTECTOMY-MULTI SITE N/A 10/20/2017   Procedure: ROBOTIC ASSISTED LAPAROSCOPIC CHOLECYSTECTOMY-MULTI SITE;  Surgeon: Jules Husbands, MD;  Location:  [facility] ORS;  Service: General;  Laterality: N/A;  . UMBILICAL HERNIA REPAIR N/A 10/20/2017   Procedure: HERNIA REPAIR UMBILICAL ADULT;  Surgeon: Jules Husbands, MD;  Location: [facility] ORS;  Service: General;  Laterality: N/A;    Social History   Tobacco Use  . Smoking status: Former Smoker    Quit date: 09/02/1978    Years since quitting: 41.6  . Smokeless tobacco: Never Used  . Tobacco comment: quit 40 yrs  Vaping Use  . Vaping Use: Never used  Substance Use Topics  . Alcohol use: Yes    Alcohol/week: 3.0 standard drinks    Types: 3 Glasses of wine per week    Comment: none last 24hrs  . Drug use: No     Medication list has been reviewed and updated.  Current Meds  Medication Sig  . latanoprost (XALATAN) 0.005 % ophthalmic solution Apply to eye. Eye doctor  . loratadine (CLARITIN) 10 MG tablet Take 10 mg by mouth daily.  . meloxicam (MOBIC) 15 MG tablet Take 1 tablet (15 mg total) by mouth daily.  . metoprolol succinate (TOPROL-XL) 50 MG 24 hr tablet TAKE 1 TABLET BY MOUTH ONCE DAILY. TAKE WITH OR IMMEDIATELY FOLLOWING MEAL  . Multiple Vitamins-Minerals (MULTIVITAMIN ADULT PO) Take 1 tablet by mouth daily.   . Omega-3 Fatty Acids (FISH OIL PO) Take 1 capsule by mouth daily.     PHQ 2/9 Scores 04/05/2020 08/30/2019 08/24/2019 07/26/2019  PHQ - 2 Score 0 0 0 0  PHQ- 9 Score 0 0 - 0    GAD 7 : Generalized Anxiety Score 04/05/2020 08/30/2019 07/26/2019 03/01/2019  Nervous, Anxious, on Edge 0 0 0 0  Control/stop worrying 0 0 0 1  Worry too much - different things 0 0 0 0  Trouble relaxing 0 0 0 0  Restless 0 0 0 0  Easily annoyed or irritable 1 0 0 0  Afraid - awful might happen 0 0 0 0  Total GAD 7 Score 1 0 0 1  Anxiety Difficulty - - - Not difficult at all    BP Readings from Last 3 Encounters:  04/05/20 130/70  10/11/19 118/67  08/30/19 120/80    Physical Exam Vitals and nursing note reviewed.  HENT:     Head: Normocephalic.     Right Ear: Tympanic membrane, ear canal and  external ear normal.     Left Ear: Tympanic membrane, ear canal and external ear normal.     Nose: Nose normal.     Mouth/Throat:     Mouth: Mucous membranes are moist.  Eyes:     General: No scleral icterus.       Right eye: No discharge.        Left eye: No discharge.     Conjunctiva/sclera: Conjunctivae normal.     Pupils: Pupils are equal, round, and reactive to light.  Neck:     Thyroid: No thyromegaly.     Vascular: No carotid bruit or JVD.     Trachea: No tracheal deviation.  Cardiovascular:     Rate and Rhythm: Normal rate and regular rhythm.     Heart sounds: S1 normal and S2 normal.  Murmur heard.  Systolic murmur is present with a grade of 1/6.  No diastolic murmur is present.  No friction rub. No gallop.   Pulmonary:     Effort: No respiratory distress.     Breath sounds: Normal breath sounds. No wheezing, rhonchi or rales.  Abdominal:     General: Bowel sounds are normal.     Palpations: Abdomen is soft. There is no mass.     Tenderness: There is no abdominal tenderness. There is no guarding or rebound.  Musculoskeletal:        General: No tenderness. Normal range of motion.     Cervical back: Normal range of motion and neck supple. No rigidity or tenderness.  Lymphadenopathy:     Cervical: No cervical adenopathy.  Skin:    General: Skin is warm.     Findings: No rash.  Neurological:     Mental Status: He is alert and oriented to person, place, and time.     Cranial Nerves: No cranial nerve deficit.     Deep Tendon Reflexes: Reflexes are normal and symmetric.     Wt Readings from Last 3 Encounters:  04/05/20 179 lb (81.2 kg)  10/11/19 180 lb (81.6 kg)  08/30/19 177 lb (80.3 kg)    BP 130/70   Pulse 76   Ht 5\' 8"  (1.727 m)   Wt 179 lb (81.2 kg)   BMI 27.22 kg/m   Assessment and Plan: Patient's previous encounters and labs were reviewed. 1. Essential hypertension Chronic.  Controlled.  Stable.  Continue metoprolol 50 mg once a day.  Will recheck in  6 months. - metoprolol succinate (TOPROL-XL) 50 MG 24 hr tablet; TAKE 1 TABLET BY MOUTH ONCE DAILY. TAKE WITH OR IMMEDIATELY FOLLOWING MEAL  Dispense: 90 tablet; Refill: 1  2. Essential tremor Chronic.  Controlled.  Stable.  Will continue metoprolol XL 50 mg once a day.  Will recheck in 6 months. - metoprolol succinate (TOPROL-XL) 50 MG 24 hr tablet; TAKE 1 TABLET BY MOUTH ONCE DAILY. TAKE WITH OR IMMEDIATELY FOLLOWING MEAL  Dispense: 90 tablet; Refill: 1  3. Hip pain Patient is controlled on meloxicam 15 mg daily.  Will recheck in 6 months at which time GFR will be reevaluated on the NSAID. - meloxicam (MOBIC) 15 MG tablet; Take 1 tablet (15 mg total) by mouth daily.  Dispense: 90 tablet; Refill: 3

## 2020-04-26 ENCOUNTER — Telehealth: Payer: Self-pay | Admitting: Family Medicine

## 2020-04-26 NOTE — Telephone Encounter (Signed)
error 

## 2020-05-27 ENCOUNTER — Encounter: Payer: Self-pay | Admitting: Family Medicine

## 2020-05-28 ENCOUNTER — Ambulatory Visit (INDEPENDENT_AMBULATORY_CARE_PROVIDER_SITE_OTHER): Payer: Medicare HMO | Admitting: Family Medicine

## 2020-05-28 ENCOUNTER — Other Ambulatory Visit: Payer: Self-pay

## 2020-05-28 ENCOUNTER — Encounter: Payer: Self-pay | Admitting: Family Medicine

## 2020-05-28 VITALS — BP 138/88 | HR 60 | Temp 99.0°F | Ht 68.0 in | Wt 174.0 lb

## 2020-05-28 DIAGNOSIS — J0100 Acute maxillary sinusitis, unspecified: Secondary | ICD-10-CM | POA: Diagnosis not present

## 2020-05-28 MED ORDER — AMOXICILLIN 500 MG PO CAPS: 500.0000 mg | ORAL_CAPSULE | Freq: Three times a day (TID) | ORAL | 0 refills | Status: DC

## 2020-05-28 NOTE — Progress Notes (Signed)
Date:  05/28/2020   Name:  Derek Figueroa   DOB:  1950/11/23   MRN:  [id number]   Chief Complaint: Sinusitis (Started last week with congestion- clear. Started getting worse Friday with green production. Took covid test on Friday- negative. Has been taking allergy med and cough syrup as needed for symptoms. Little bit of yellow, mostly green. )  Sinusitis This is a new problem. The current episode started in the past 7 days. The problem has been gradually worsening since onset. There has been no fever. Associated symptoms include congestion, coughing, headaches, sinus pressure and sneezing. Pertinent negatives include no chills, diaphoresis, ear pain, hoarse voice, neck pain, shortness of breath, sore throat or swollen glands. Past treatments include oral decongestants. The treatment provided mild relief.    Lab Results  Component Value Date   CREATININE 0.91 08/30/2019   BUN 18 08/30/2019   NA 141 08/30/2019   K 4.8 08/30/2019   CL 104 08/30/2019   CO2 23 08/30/2019   Lab Results  Component Value Date   CHOL 206 (H) 08/30/2019   HDL 54 08/30/2019   LDLCALC 133 (H) 08/30/2019   TRIG 109 08/30/2019   CHOLHDL 3.8 08/20/2018   No results found for: TSH No results found for: HGBA1C Lab Results  Component Value Date   WBC 5.3 08/05/2017   HGB 15.9 08/05/2017   HCT 46.5 08/05/2017   MCV 84.3 08/05/2017   PLT 234 08/05/2017   Lab Results  Component Value Date   ALT 471 (H) 08/05/2017   AST 174 (H) 08/05/2017   ALKPHOS 208 (H) 08/05/2017   BILITOT 6.3 (H) 08/05/2017     Review of Systems  Constitutional: Negative for chills, diaphoresis and fever.  HENT: Positive for congestion, sinus pressure and sneezing. Negative for drooling, ear discharge, ear pain, hoarse voice and sore throat.   Respiratory: Positive for cough. Negative for shortness of breath and wheezing.   Cardiovascular: Negative for chest pain, palpitations and leg swelling.  Gastrointestinal: Negative  for abdominal pain, blood in stool, constipation, diarrhea and nausea.  Endocrine: Negative for polydipsia.  Genitourinary: Negative for dysuria, frequency, hematuria and urgency.  Musculoskeletal: Negative for back pain, myalgias and neck pain.  Skin: Negative for rash.  Allergic/Immunologic: Negative for environmental allergies.  Neurological: Positive for headaches. Negative for dizziness.  Hematological: Does not bruise/bleed easily.  Psychiatric/Behavioral: Negative for suicidal ideas. The patient is not nervous/anxious.     Patient Active Problem List   Diagnosis Date Noted  . Personal history of colonic polyps   . Polyp of transverse colon   . Cholecystitis, acute   . Obstruction of bile duct   . Jaundice   . Abnormal findings on imaging of biliary tract   . Residual hemorrhoidal skin tags 06/23/2014  . Right shoulder pain 01/05/2014  . Gastroesophageal reflux disease without esophagitis 01/05/2014  . Elevated blood pressure 01/05/2014  . History of adenomatous polyp of colon 05/26/2006    No Known Allergies  Past Surgical History:  Procedure Laterality Date  . COLONOSCOPY    . COLONOSCOPY WITH PROPOFOL N/A 10/11/2019   Procedure: COLONOSCOPY WITH PROPOFOL;  Surgeon: Midge Minium, MD;  Location: [facility] ENDOSCOPY;  Service: Endoscopy;  Laterality: N/A;  . ERCP N/A 08/13/2017   Procedure: ENDOSCOPIC RETROGRADE CHOLANGIOPANCREATOGRAPHY (ERCP);  Surgeon: Midge Minium, MD;  Location: [facility] ENDOSCOPY;  Service: Endoscopy;  Laterality: N/A;  . EYE SURGERY    . gall stones    . HERNIA REPAIR    .  ROBOTIC ASSISTED LAPAROSCOPIC CHOLECYSTECTOMY-MULTI SITE N/A 10/20/2017   Procedure: ROBOTIC ASSISTED LAPAROSCOPIC CHOLECYSTECTOMY-MULTI SITE;  Surgeon: Jules Husbands, MD;  Location: [facility] ORS;  Service: General;  Laterality: N/A;  . UMBILICAL HERNIA REPAIR N/A 10/20/2017   Procedure: HERNIA REPAIR UMBILICAL ADULT;  Surgeon: Jules Husbands, MD;  Location: [facility] ORS;  Service: General;   Laterality: N/A;    Social History   Tobacco Use  . Smoking status: Former Smoker    Quit date: 09/02/1978    Years since quitting: 41.7  . Smokeless tobacco: Never Used  . Tobacco comment: quit 40 yrs  Vaping Use  . Vaping Use: Never used  Substance Use Topics  . Alcohol use: Yes    Alcohol/week: 3.0 standard drinks    Types: 3 Glasses of wine per week    Comment: none last 24hrs  . Drug use: No     Medication list has been reviewed and updated.  Current Meds  Medication Sig  . Alum Hydroxide-Mag Carbonate (GAVISCON PO) Take 1 tablet by mouth daily as needed (for stomach).  . latanoprost (XALATAN) 0.005 % ophthalmic solution Apply to eye. Eye doctor  . loratadine (CLARITIN) 10 MG tablet Take 10 mg by mouth daily.  . meloxicam (MOBIC) 15 MG tablet Take 1 tablet (15 mg total) by mouth daily.  . metoprolol succinate (TOPROL-XL) 50 MG 24 hr tablet TAKE 1 TABLET BY MOUTH ONCE DAILY. TAKE WITH OR IMMEDIATELY FOLLOWING MEAL  . Multiple Vitamins-Minerals (MULTIVITAMIN ADULT PO) Take 1 tablet by mouth daily.   . Omega-3 Fatty Acids (FISH OIL PO) Take 1 capsule by mouth daily.     PHQ 2/9 Scores 04/05/2020 08/30/2019 08/24/2019 07/26/2019  PHQ - 2 Score 0 0 0 0  PHQ- 9 Score 0 0 - 0    GAD 7 : Generalized Anxiety Score 04/05/2020 08/30/2019 07/26/2019 03/01/2019  Nervous, Anxious, on Edge 0 0 0 0  Control/stop worrying 0 0 0 1  Worry too much - different things 0 0 0 0  Trouble relaxing 0 0 0 0  Restless 0 0 0 0  Easily annoyed or irritable 1 0 0 0  Afraid - awful might happen 0 0 0 0  Total GAD 7 Score 1 0 0 1  Anxiety Difficulty - - - Not difficult at all    BP Readings from Last 3 Encounters:  05/28/20 138/88  04/05/20 130/70  10/11/19 118/67    Physical Exam Vitals and nursing note reviewed.  HENT:     Head: Normocephalic.     Right Ear: Tympanic membrane, ear canal and external ear normal. There is no impacted cerumen.     Left Ear: Tympanic membrane, ear canal and  external ear normal. There is no impacted cerumen.     Nose: Congestion and rhinorrhea present.     Mouth/Throat:     Mouth: Oropharynx is clear and moist. Mucous membranes are moist.  Eyes:     General: No scleral icterus.       Right eye: No discharge.        Left eye: No discharge.     Extraocular Movements: EOM normal.     Conjunctiva/sclera: Conjunctivae normal.     Pupils: Pupils are equal, round, and reactive to light.  Neck:     Thyroid: No thyromegaly.     Vascular: No JVD.     Trachea: No tracheal deviation.  Cardiovascular:     Rate and Rhythm: Normal rate and regular rhythm.  Pulses: Intact distal pulses.     Heart sounds: Normal heart sounds. No murmur heard. No friction rub. No gallop.   Pulmonary:     Effort: No respiratory distress.     Breath sounds: Normal breath sounds. No wheezing, rhonchi or rales.  Abdominal:     General: Bowel sounds are normal.     Palpations: Abdomen is soft. There is no hepatosplenomegaly or mass.     Tenderness: There is no abdominal tenderness. There is no CVA tenderness, guarding or rebound.  Musculoskeletal:        General: No tenderness or edema. Normal range of motion.     Cervical back: Normal range of motion and neck supple.  Lymphadenopathy:     Cervical: No cervical adenopathy.  Skin:    General: Skin is warm.     Findings: No rash.  Neurological:     Mental Status: He is alert and oriented to person, place, and time.     Cranial Nerves: No cranial nerve deficit.     Deep Tendon Reflexes: Strength normal and reflexes are normal and symmetric.     Wt Readings from Last 3 Encounters:  05/28/20 174 lb (78.9 kg)  04/05/20 179 lb (81.2 kg)  10/11/19 180 lb (81.6 kg)    BP 138/88   Pulse 60   Temp 99 F (37.2 C) (Oral)   Ht 5\' 8"  (1.727 m)   Wt 174 lb (78.9 kg)   SpO2 98%   BMI 26.46 kg/m    Assessment and Plan: 1. Acute maxillary sinusitis, recurrence not specified Acute.  Persistent.  Stable.  Patient has  exam and history consistent with acute maxillary sinusitis.  Will initiate amoxicillin 500 mg 3 times a day for 10 days. - amoxicillin (AMOXIL) 500 MG capsule; Take 1 capsule (500 mg total) by mouth 3 (three) times daily.  Dispense: 30 capsule; Refill: 0

## 2020-06-07 ENCOUNTER — Other Ambulatory Visit: Payer: Self-pay

## 2020-06-07 ENCOUNTER — Telehealth: Payer: Self-pay

## 2020-06-07 DIAGNOSIS — J0100 Acute maxillary sinusitis, unspecified: Secondary | ICD-10-CM

## 2020-06-07 MED ORDER — CEFUROXIME AXETIL 500 MG PO TABS: 500.0000 mg | ORAL_TABLET | Freq: Two times a day (BID) | ORAL | 0 refills | Status: DC

## 2020-06-07 NOTE — Telephone Encounter (Signed)
Sent in ceftin 500mg  x 10 days to [facility]- will see in office if this doesn't help

## 2020-06-07 NOTE — Progress Notes (Unsigned)
Sent in ceftin

## 2020-06-07 NOTE — Telephone Encounter (Signed)
Copied from [place] [id number]. Topic: General - Other >> Jun 07, 2020 10:54 AM Keene Breath wrote: Reason for CRM: Patient called to ask the nurse or doctor to call him regarding the issues he is still having on his left side.  He stated that the medicine has made the right side feel better, but he is still not doing well on the left side.  Please call patient to discuss further at [phone]

## 2020-06-21 ENCOUNTER — Ambulatory Visit
Admission: RE | Admit: 2020-06-21 | Discharge: 2020-06-21 | Disposition: A | Discharge status: Home / Self Care | Payer: Medicare HMO | Source: Ambulatory Visit | Attending: Family Medicine | Admitting: Family Medicine

## 2020-06-21 ENCOUNTER — Other Ambulatory Visit: Payer: Self-pay

## 2020-06-21 ENCOUNTER — Encounter: Payer: Self-pay | Admitting: Family Medicine

## 2020-06-21 ENCOUNTER — Ambulatory Visit (INDEPENDENT_AMBULATORY_CARE_PROVIDER_SITE_OTHER): Payer: Medicare HMO | Admitting: Family Medicine

## 2020-06-21 ENCOUNTER — Ambulatory Visit
Admission: RE | Admit: 2020-06-21 | Discharge: 2020-06-21 | Disposition: A | Discharge status: Home / Self Care | Payer: Medicare HMO | Attending: Family Medicine | Admitting: Family Medicine

## 2020-06-21 VITALS — BP 130/80 | HR 60 | Temp 98.4°F | Ht 68.0 in | Wt 175.0 lb

## 2020-06-21 DIAGNOSIS — J329 Chronic sinusitis, unspecified: Secondary | ICD-10-CM | POA: Diagnosis not present

## 2020-06-21 DIAGNOSIS — J0101 Acute recurrent maxillary sinusitis: Secondary | ICD-10-CM | POA: Insufficient documentation

## 2020-06-21 DIAGNOSIS — L0100 Impetigo, unspecified: Secondary | ICD-10-CM

## 2020-06-21 MED ORDER — TRIAMCINOLONE ACETONIDE 55 MCG/ACT NA AERO: 2.0000 | INHALATION_SPRAY | Freq: Every day | NASAL | 12 refills | Status: DC

## 2020-06-21 MED ORDER — MUPIROCIN 2 % EX OINT: 1.0000 "application " | TOPICAL_OINTMENT | Freq: Two times a day (BID) | CUTANEOUS | 0 refills | Status: DC

## 2020-06-21 MED ORDER — AMOXICILLIN-POT CLAVULANATE 875-125 MG PO TABS: 1.0000 | ORAL_TABLET | Freq: Two times a day (BID) | ORAL | 0 refills | Status: DC

## 2020-06-21 NOTE — Progress Notes (Signed)
Date:  06/21/2020   Name:  Derek Figueroa   DOB:  11/04/1950   MRN:  [id number]   Chief Complaint: Sinusitis (Using nasal spray and hot compresses- green production, sometimes clear)  Sinusitis This is a recurrent problem. The current episode started 1 to 4 weeks ago. The problem has been waxing and waning since onset. There has been no fever. The pain is mild. Associated symptoms include congestion and sinus pressure. Pertinent negatives include no chills, coughing, diaphoresis, ear pain, headaches, hoarse voice, neck pain, shortness of breath, sneezing, sore throat or swollen glands. Past treatments include antibiotics. The treatment provided mild relief.    Lab Results  Component Value Date   CREATININE 0.91 08/30/2019   BUN 18 08/30/2019   NA 141 08/30/2019   K 4.8 08/30/2019   CL 104 08/30/2019   CO2 23 08/30/2019   Lab Results  Component Value Date   CHOL 206 (H) 08/30/2019   HDL 54 08/30/2019   LDLCALC 133 (H) 08/30/2019   TRIG 109 08/30/2019   CHOLHDL 3.8 08/20/2018   No results found for: TSH No results found for: HGBA1C Lab Results  Component Value Date   WBC 5.3 08/05/2017   HGB 15.9 08/05/2017   HCT 46.5 08/05/2017   MCV 84.3 08/05/2017   PLT 234 08/05/2017   Lab Results  Component Value Date   ALT 471 (H) 08/05/2017   AST 174 (H) 08/05/2017   ALKPHOS 208 (H) 08/05/2017   BILITOT 6.3 (H) 08/05/2017     Review of Systems  Constitutional: Negative for chills, diaphoresis and fever.  HENT: Positive for congestion and sinus pressure. Negative for drooling, ear discharge, ear pain, hoarse voice, sneezing and sore throat.   Respiratory: Negative for cough, shortness of breath and wheezing.   Cardiovascular: Negative for chest pain, palpitations and leg swelling.  Gastrointestinal: Negative for abdominal pain, blood in stool, constipation, diarrhea and nausea.  Endocrine: Negative for polydipsia.  Genitourinary: Negative for dysuria, frequency,  hematuria and urgency.  Musculoskeletal: Negative for back pain, myalgias and neck pain.  Skin: Negative for rash.  Allergic/Immunologic: Negative for environmental allergies.  Neurological: Negative for dizziness and headaches.  Hematological: Does not bruise/bleed easily.  Psychiatric/Behavioral: Negative for suicidal ideas. The patient is not nervous/anxious.     Patient Active Problem List   Diagnosis Date Noted  . Personal history of colonic polyps   . Polyp of transverse colon   . Cholecystitis, acute   . Obstruction of bile duct   . Jaundice   . Abnormal findings on imaging of biliary tract   . Residual hemorrhoidal skin tags 06/23/2014  . Right shoulder pain 01/05/2014  . Gastroesophageal reflux disease without esophagitis 01/05/2014  . Elevated blood pressure 01/05/2014  . History of adenomatous polyp of colon 05/26/2006    No Known Allergies  Past Surgical History:  Procedure Laterality Date  . COLONOSCOPY    . COLONOSCOPY WITH PROPOFOL N/A 10/11/2019   Procedure: COLONOSCOPY WITH PROPOFOL;  Surgeon: Lucilla Lame, MD;  Location: [facility] ENDOSCOPY;  Service: Endoscopy;  Laterality: N/A;  . ERCP N/A 08/13/2017   Procedure: ENDOSCOPIC RETROGRADE CHOLANGIOPANCREATOGRAPHY (ERCP);  Surgeon: Lucilla Lame, MD;  Location: [facility] ENDOSCOPY;  Service: Endoscopy;  Laterality: N/A;  . EYE SURGERY    . gall stones    . HERNIA REPAIR    . ROBOTIC ASSISTED LAPAROSCOPIC CHOLECYSTECTOMY-MULTI SITE N/A 10/20/2017   Procedure: ROBOTIC ASSISTED LAPAROSCOPIC CHOLECYSTECTOMY-MULTI SITE;  Surgeon: Jules Husbands, MD;  Location: [facility] ORS;  Service: General;  Laterality: N/A;  . UMBILICAL HERNIA REPAIR N/A 10/20/2017   Procedure: HERNIA REPAIR UMBILICAL ADULT;  Surgeon: Jules Husbands, MD;  Location: [facility] ORS;  Service: General;  Laterality: N/A;    Social History   Tobacco Use  . Smoking status: Former Smoker    Quit date: 09/02/1978    Years since quitting: 41.8  . Smokeless tobacco: Never  Used  . Tobacco comment: quit 40 yrs  Vaping Use  . Vaping Use: Never used  Substance Use Topics  . Alcohol use: Yes    Alcohol/week: 3.0 standard drinks    Types: 3 Glasses of wine per week    Comment: none last 24hrs  . Drug use: No     Medication list has been reviewed and updated.  Current Meds  Medication Sig  . Alum Hydroxide-Mag Carbonate (GAVISCON PO) Take 1 tablet by mouth daily as needed (for stomach).  . latanoprost (XALATAN) 0.005 % ophthalmic solution Apply to eye. Eye doctor  . loratadine (CLARITIN) 10 MG tablet Take 10 mg by mouth daily.  . meloxicam (MOBIC) 15 MG tablet Take 1 tablet (15 mg total) by mouth daily.  . metoprolol succinate (TOPROL-XL) 50 MG 24 hr tablet TAKE 1 TABLET BY MOUTH ONCE DAILY. TAKE WITH OR IMMEDIATELY FOLLOWING MEAL  . Multiple Vitamins-Minerals (MULTIVITAMIN ADULT PO) Take 1 tablet by mouth daily.   . Omega-3 Fatty Acids (FISH OIL PO) Take 1 capsule by mouth daily.     PHQ 2/9 Scores 04/05/2020 08/30/2019 08/24/2019 07/26/2019  PHQ - 2 Score 0 0 0 0  PHQ- 9 Score 0 0 - 0    GAD 7 : Generalized Anxiety Score 04/05/2020 08/30/2019 07/26/2019 03/01/2019  Nervous, Anxious, on Edge 0 0 0 0  Control/stop worrying 0 0 0 1  Worry too much - different things 0 0 0 0  Trouble relaxing 0 0 0 0  Restless 0 0 0 0  Easily annoyed or irritable 1 0 0 0  Afraid - awful might happen 0 0 0 0  Total GAD 7 Score 1 0 0 1  Anxiety Difficulty - - - Not difficult at all    BP Readings from Last 3 Encounters:  06/21/20 130/80  05/28/20 138/88  04/05/20 130/70    Physical Exam Vitals and nursing note reviewed.  HENT:     Head: Normocephalic.     Right Ear: Tympanic membrane, ear canal and external ear normal. There is no impacted cerumen.     Left Ear: Tympanic membrane, ear canal and external ear normal. There is no impacted cerumen.     Nose: Nose normal. No congestion or rhinorrhea.     Mouth/Throat:     Mouth: Oropharynx is clear and moist. Mucous  membranes are moist.     Pharynx: No oropharyngeal exudate or posterior oropharyngeal erythema.  Eyes:     General: No scleral icterus.       Right eye: No discharge.        Left eye: No discharge.     Extraocular Movements: EOM normal.     Conjunctiva/sclera: Conjunctivae normal.     Pupils: Pupils are equal, round, and reactive to light.  Neck:     Thyroid: No thyromegaly.     Vascular: No JVD.     Trachea: No tracheal deviation.  Cardiovascular:     Rate and Rhythm: Normal rate and regular rhythm.     Pulses: Intact distal pulses.     Heart sounds: Normal heart  sounds. No murmur heard. No friction rub. No gallop.   Pulmonary:     Effort: No respiratory distress.     Breath sounds: Normal breath sounds. No wheezing or rales.  Abdominal:     General: Bowel sounds are normal. There is no distension.     Palpations: Abdomen is soft. There is no hepatosplenomegaly or mass.     Tenderness: There is no abdominal tenderness. There is no CVA tenderness, guarding or rebound.     Hernia: No hernia is present.  Musculoskeletal:        General: No tenderness or edema. Normal range of motion.     Cervical back: Normal range of motion and neck supple.  Lymphadenopathy:     Cervical: No cervical adenopathy.  Skin:    General: Skin is warm.     Findings: No rash.  Neurological:     Mental Status: He is alert and oriented to person, place, and time.     Cranial Nerves: No cranial nerve deficit.     Deep Tendon Reflexes: Strength normal and reflexes are normal and symmetric.     Wt Readings from Last 3 Encounters:  06/21/20 175 lb (79.4 kg)  05/28/20 174 lb (78.9 kg)  04/05/20 179 lb (81.2 kg)    BP 130/80   Pulse 60   Temp 98.4 F (36.9 C) (Oral)   Ht 5\' 8"  (1.727 m)   Wt 175 lb (79.4 kg)   BMI 26.61 kg/m   Assessment and Plan: 1. Acute recurrent maxillary sinusitis New onset.  Recurrent.  Stable.  Patient has continued discomfort of the left frontal extending to the scalp  and left maxillary sinuses with fullness there is no purulent nasal drainage but remains congested.  Patient has had 2 courses of antibiotics for this for we will not be pursuing antibiotics for the sinuses but will for the following concern down below.  We will proceed with an x-ray of the sinuses to see if there is a chronic sinusitis circumstance involved. - DG Sinuses Complete; Future - triamcinolone (NASACORT) 55 MCG/ACT AERO nasal inhaler; Place 2 sprays into the nose daily.  Dispense: 1 each; Refill: 12 - amoxicillin-clavulanate (AUGMENTIN) 875-125 MG tablet; Take 1 tablet by mouth 2 (two) times daily.  Dispense: 20 tablet; Refill: 0  2. Impetigo New onset.  Acute.  There is a honey crusted area that is involving the septal area of the nasal septum superficially.  This is consistent with impetigo.  Patient's been instructed to take a warm cloth and gently to remove the eschar and to apply Bactroban ointment twice a day and we will initiate Augmentin 875 mg twice a day. - mupirocin ointment (BACTROBAN) 2 %; Apply 1 application topically 2 (two) times daily.  Dispense: 22 g; Refill: 0 - amoxicillin-clavulanate (AUGMENTIN) 875-125 MG tablet; Take 1 tablet by mouth 2 (two) times daily.  Dispense: 20 tablet; Refill: 0

## 2020-06-21 NOTE — Patient Instructions (Signed)
Treatment of Skin Disease: Comprehensive Therapeutic Strategies (4th ed., pp. 332-334). [employer]. Retrieved from https://www.clinicalkey.com/#!/content/book/3-s2.0-B978070205235400106X?scrollTo=%23hl0000032">  Impetigo, Adult Impetigo is an infection of the skin. It commonly occurs in young children, but it can also occur in adults. The infection causes itchy blisters and sores that produce brownish-yellow fluid. As the fluid dries, it forms a thick, honey-colored crust. These skin changes usually occur on the face, but they can also affect other areas of the body. Impetigo usually goes away in 7-10 days with treatment. What are the causes? This condition is caused by two types of bacteria. It may be caused by staphylococci or streptococci bacteria. These bacteria cause impetigo when they get under the surface of the skin. This often happens after some damage to the skin, such as:  Cuts, scrapes, or scratches.  Rashes.  Insect bites, especially when you scratch the area of a bite.  Chickenpox or other illnesses that cause open skin sores.  Nail biting or chewing. Impetigo can spread easily from one person to another (is contagious). It may be spread through close skin contact or by sharing towels, clothing, or other items that an infected person has touched. Scratching the affected area can cause impetigo to spread to other parts of the body. The bacteria can get under your fingernails and spread when you touch another area of your skin. What increases the risk? The following factors may make you more likely to develop this condition:  Playing sports that include skin-to-skin contact with others.  Having broken skin, such as from a cut or scrape.  Living in an area that has high humidity levels.  Having poor hygiene.  Having high levels of staphylococci in your nose.  Having a condition that weakens the skin integrity, such as: ? Having a weak body defense system (immune  system). ? Having a skin condition with open sores, such as chickenpox. ? Having diabetes. What are the signs or symptoms? The main symptom of this condition is small blisters, often on the face around the mouth and nose. In time, the blisters break open and turn into tiny sores (lesions) with a yellow crust. In some cases, the blisters cause itching or burning. Scratching, irritation, or lack of treatment may cause these small lesions to get larger. Other possible symptoms include:  Larger blisters.  Pus.  Swollen lymph glands. How is this diagnosed? This condition is usually diagnosed during a physical exam. A skin sample or a sample of fluid from a blister may be taken for lab tests that involve growing bacteria (culture test). Lab tests can help confirm the diagnosis or help determine the best treatment. How is this treated? Treatment for this condition depends on the severity of the condition:  Mild impetigo can be treated with prescription antibiotic cream.  Oral antibiotic medicine may be used in more severe cases.  Medicines that reduce itchiness (antihistamines)may also be used. Follow these instructions at home: Medicines  Take over-the-counter and prescription medicines only as told by your health care provider.  Apply or take your antibiotic as told by your health care provider. Do not stop using the antibiotic even if your condition improves.  Before applying antibiotic cream or ointment, you should: ? Gently wash the infected areas with antibacterial soap and warm water. ? Soak crusted areas in warm, soapy water using antibacterial soap. ? Gently rub the areas to remove crusts. Do not scrub. Preventing the spread of infection  To help prevent impetigo from spreading to other body areas: ?   Keep your fingernails short and clean. ? Do not scratch the blisters or sores. ? Cover infected areas, if necessary, to keep from scratching. ? Wash your hands often with soap  and warm water.  To help prevent impetigo from spreading to other people: ? Do not share towels. ? Wash your clothing and bedsheets in water that is 140F (60C) or warmer. ? Stay home until you have used an antibiotic cream for 48 hours (2 days) or an oral antibiotic medicine for 24 hours (1 day).  You should only return to work and activities with other people if your skin shows significant improvement.  You may return to contact sports after you have used antibiotic medicine for 72 hours (3 days).   General instructions  Keep all follow-up visits. This is important. How is this prevented?  Wash your hands often with soap and warm water.  Do not share towels, washcloths, clothing, bedding, or razors.  Keep your fingernails short.  Keep any cuts, scrapes, bug bites, or rashes clean and covered.  Use insect repellent to prevent bug bites. Contact a health care provider if:  You develop more blisters or sores, even with treatment.  Other family members get sores.  Your skin sores are not improving after 72 hours (3 days) of treatment.  You have a fever. Get help right away if:  You see spreading redness or swelling of the skin around your sores.  You develop a sore throat.  The area around your rash becomes warm, red, or tender to the touch.  You have dark, reddish-brown urine.  You do not urinate often or you urinate small amounts.  You are very tired (lethargic).  You have swelling in your face, hands, or feet. Summary  Impetigo is a skin infection that causes itchy blisters and sores that produce brownish-yellow fluid. As the fluid dries, it forms a crust.  This condition is caused by staphylococci or streptococci bacteria. These bacteria cause impetigo when they get under the surface of the skin, such as through cuts, rashes, bug bites, or open sores.  Treatment for this condition may include antibiotic ointment or oral antibiotics.  To help prevent impetigo  from spreading to other body areas, make sure you keep your fingernails short, avoid scratching, cover any blisters, and wash your hands often.  If you have impetigo, stay home until you have used an antibiotic cream for 48 hours (2 days) or an oral antibiotic medicine for 24 hours (1 day). You should only return to work and activities with other people if your skin shows significant improvement. This information is not intended to replace advice given to you by your health care provider. Make sure you discuss any questions you have with your health care provider. Document Revised: 10/12/2019 Document Reviewed: 10/12/2019 Elsevier Patient Education  2021 [employer]  

## 2020-07-03 DIAGNOSIS — M79674 Pain in right toe(s): Secondary | ICD-10-CM | POA: Diagnosis not present

## 2020-07-03 DIAGNOSIS — L600 Ingrowing nail: Secondary | ICD-10-CM | POA: Diagnosis not present

## 2020-07-03 DIAGNOSIS — B351 Tinea unguium: Secondary | ICD-10-CM | POA: Diagnosis not present

## 2020-07-03 DIAGNOSIS — M79675 Pain in left toe(s): Secondary | ICD-10-CM | POA: Diagnosis not present

## 2020-07-09 ENCOUNTER — Telehealth: Payer: Self-pay | Admitting: Family Medicine

## 2020-07-09 ENCOUNTER — Other Ambulatory Visit: Payer: Self-pay

## 2020-07-09 DIAGNOSIS — J0101 Acute recurrent maxillary sinusitis: Secondary | ICD-10-CM

## 2020-07-09 NOTE — Telephone Encounter (Signed)
Called patient to inform him of message from tara.

## 2020-07-09 NOTE — Telephone Encounter (Signed)
Please call and tell him we have placed a referral to ENT- be expecting a call in two days at most

## 2020-07-09 NOTE — Progress Notes (Unsigned)
Ref to ENT placed

## 2020-07-09 NOTE — Telephone Encounter (Signed)
Pt states he has been seen twice now for his sinus infection.  It is getting worse.  Now he has sore throat.  The drainage is going down his throat.  He is coughing a lot. Pt states he was up last night every 30 min or so coughing so much.  Pt states he is not getting much sleep. Pt would like Baxter Flattery to call when she can. cb [phone]) [phone]

## 2020-07-11 ENCOUNTER — Telehealth: Payer: Self-pay

## 2020-07-11 NOTE — Telephone Encounter (Signed)
Copied from Jennette [phone]. Topic: General - Other >> Jul 11, 2020  2:13 PM Pawlus, Brayton Layman A wrote: Reason for CRM: PT requested a call back from [place]. PT did not want to specify the reasonsing besides it was from his recent visit.

## 2020-07-12 NOTE — Telephone Encounter (Signed)
I called pt and also called ENT to see if they can squeeze him in, but they cannot. They did say they would call him if they have an opening sooner than March, which I informed the pt of.

## 2020-07-19 DIAGNOSIS — J342 Deviated nasal septum: Secondary | ICD-10-CM | POA: Diagnosis not present

## 2020-07-19 DIAGNOSIS — J301 Allergic rhinitis due to pollen: Secondary | ICD-10-CM | POA: Diagnosis not present

## 2020-07-19 DIAGNOSIS — J320 Chronic maxillary sinusitis: Secondary | ICD-10-CM | POA: Diagnosis not present

## 2020-07-20 ENCOUNTER — Encounter: Payer: Self-pay | Admitting: Family Medicine

## 2020-07-26 ENCOUNTER — Other Ambulatory Visit: Payer: Self-pay | Admitting: Otolaryngology

## 2020-07-26 ENCOUNTER — Other Ambulatory Visit (HOSPITAL_COMMUNITY): Payer: Self-pay | Admitting: Otolaryngology

## 2020-07-26 DIAGNOSIS — J320 Chronic maxillary sinusitis: Secondary | ICD-10-CM

## 2020-08-07 ENCOUNTER — Other Ambulatory Visit: Payer: Self-pay

## 2020-08-07 ENCOUNTER — Ambulatory Visit
Admission: RE | Admit: 2020-08-07 | Discharge: 2020-08-07 | Disposition: A | Discharge status: Home / Self Care | Payer: Medicare HMO | Source: Ambulatory Visit | Attending: Otolaryngology | Admitting: Otolaryngology

## 2020-08-07 DIAGNOSIS — J3489 Other specified disorders of nose and nasal sinuses: Secondary | ICD-10-CM | POA: Diagnosis not present

## 2020-08-07 DIAGNOSIS — J323 Chronic sphenoidal sinusitis: Secondary | ICD-10-CM | POA: Diagnosis not present

## 2020-08-07 DIAGNOSIS — J343 Hypertrophy of nasal turbinates: Secondary | ICD-10-CM | POA: Diagnosis not present

## 2020-08-07 DIAGNOSIS — M79674 Pain in right toe(s): Secondary | ICD-10-CM | POA: Diagnosis not present

## 2020-08-07 DIAGNOSIS — L600 Ingrowing nail: Secondary | ICD-10-CM | POA: Diagnosis not present

## 2020-08-07 DIAGNOSIS — B351 Tinea unguium: Secondary | ICD-10-CM | POA: Diagnosis not present

## 2020-08-07 DIAGNOSIS — J342 Deviated nasal septum: Secondary | ICD-10-CM | POA: Diagnosis not present

## 2020-08-07 DIAGNOSIS — J320 Chronic maxillary sinusitis: Secondary | ICD-10-CM | POA: Diagnosis not present

## 2020-08-07 DIAGNOSIS — M79675 Pain in left toe(s): Secondary | ICD-10-CM | POA: Diagnosis not present

## 2020-08-15 DIAGNOSIS — J301 Allergic rhinitis due to pollen: Secondary | ICD-10-CM | POA: Diagnosis not present

## 2020-08-15 DIAGNOSIS — J320 Chronic maxillary sinusitis: Secondary | ICD-10-CM | POA: Diagnosis not present

## 2020-08-27 DIAGNOSIS — H2513 Age-related nuclear cataract, bilateral: Secondary | ICD-10-CM | POA: Diagnosis not present

## 2020-08-27 DIAGNOSIS — H40021 Open angle with borderline findings, high risk, right eye: Secondary | ICD-10-CM | POA: Diagnosis not present

## 2020-08-27 DIAGNOSIS — Z0100 Encounter for examination of eyes and vision without abnormal findings: Secondary | ICD-10-CM | POA: Diagnosis not present

## 2020-08-27 DIAGNOSIS — H40053 Ocular hypertension, bilateral: Secondary | ICD-10-CM | POA: Diagnosis not present

## 2020-08-27 DIAGNOSIS — H18593 Other hereditary corneal dystrophies, bilateral: Secondary | ICD-10-CM | POA: Diagnosis not present

## 2020-08-29 ENCOUNTER — Ambulatory Visit (INDEPENDENT_AMBULATORY_CARE_PROVIDER_SITE_OTHER): Payer: Medicare HMO

## 2020-08-29 ENCOUNTER — Other Ambulatory Visit: Payer: Self-pay

## 2020-08-29 VITALS — BP 132/80 | HR 52 | Temp 98.4°F | Resp 16 | Ht 68.0 in | Wt 174.0 lb

## 2020-08-29 DIAGNOSIS — Z0000 Encounter for general adult medical examination without abnormal findings: Secondary | ICD-10-CM

## 2020-08-29 NOTE — Progress Notes (Signed)
Subjective:   Derek Figueroa is a 70 y.o. male who presents for Medicare Annual/Subsequent preventive examination.  Review of Systems     Cardiac Risk Factors include: advanced age (>12mn, >>41women);male gender;hypertension     Objective:    Today's Vitals   08/29/20 0931  BP: 132/80  Pulse: (!) 52  Resp: 16  Temp: 98.4 F (36.9 C)  TempSrc: Oral  SpO2: 97%  Weight: 174 lb (78.9 kg)  Height: 5' 8"  (1.727 m)   Body mass index is 26.46 kg/m.  Advanced Directives 08/29/2020 10/11/2019 08/24/2019 08/13/2017 08/05/2017 06/20/2017  Does Patient Have a Medical Advance Directive? Yes Yes No No No No  Type of AParamedicof ANaplesLiving will HPaintLiving will - - - -  Copy of [place] Chart? No - copy requested No - copy requested - - - -  Would patient like information on creating a medical advance directive? - - No - Patient declined No - Patient declined No - Patient declined -    Current Medications (verified) Outpatient Encounter Medications as of 08/29/2020  Medication Sig  . latanoprost (XALATAN) 0.005 % ophthalmic solution Apply to eye. Eye doctor  . loratadine (CLARITIN) 10 MG tablet Take 10 mg by mouth daily.  . meloxicam (MOBIC) 15 MG tablet Take 1 tablet (15 mg total) by mouth daily.  . metoprolol succinate (TOPROL-XL) 50 MG 24 hr tablet TAKE 1 TABLET BY MOUTH ONCE DAILY. TAKE WITH OR IMMEDIATELY FOLLOWING MEAL  . Multiple Vitamins-Minerals (MULTIVITAMIN ADULT PO) Take 1 tablet by mouth daily.   . Omega-3 Fatty Acids (FISH OIL PO) Take 1 capsule by mouth daily.   .Marland Kitchentriamcinolone (NASACORT) 55 MCG/ACT AERO nasal inhaler Place 2 sprays into the nose daily.  . [DISCONTINUED] Alum Hydroxide-Mag Carbonate (GAVISCON PO) Take 1 tablet by mouth daily as needed (for stomach).  . [DISCONTINUED] amoxicillin-clavulanate (AUGMENTIN) 875-125 MG tablet Take 1 tablet by mouth 2 (two) times daily.  .  [DISCONTINUED] mupirocin ointment (BACTROBAN) 2 % Apply 1 application topically 2 (two) times daily.   No facility-administered encounter medications on file as of 08/29/2020.    Allergies (verified) Patient has no known allergies.   History: Past Medical History:  Diagnosis Date  . Heart murmur   . Hypertension    Past Surgical History:  Procedure Laterality Date  . COLONOSCOPY    . COLONOSCOPY WITH PROPOFOL N/A 10/11/2019   Procedure: COLONOSCOPY WITH PROPOFOL;  Surgeon: WLucilla Lame MD;  [facility];  Service: Endoscopy;  Laterality: N/A;  . ERCP N/A 08/13/2017   Procedure: ENDOSCOPIC RETROGRADE CHOLANGIOPANCREATOGRAPHY (ERCP);  Surgeon: WLucilla Lame MD;  [facility];  Service: Endoscopy;  Laterality: N/A;  . EYE SURGERY    . gall stones    . HERNIA REPAIR    . ROBOTIC ASSISTED LAPAROSCOPIC CHOLECYSTECTOMY-MULTI SITE N/A 10/20/2017   Procedure: ROBOTIC ASSISTED LAPAROSCOPIC CHOLECYSTECTOMY-MULTI SITE;  Surgeon: PJules Husbands MD;  Location: [facility] ORS;  Service: General;  Laterality: N/A;  . UMBILICAL HERNIA REPAIR N/A 10/20/2017   Procedure: HERNIA REPAIR UMBILICAL ADULT;  Surgeon: PJules Husbands MD;  Location: [facility] ORS;  Service: General;  Laterality: N/A;   Family History  Problem Relation Age of Onset  . Heart disease Mother   . Diabetes Mother   . Stroke Mother   . Cancer Mother   . Heart disease Father   . COPD Father   . Stroke Father   . Lung cancer Brother   .  COPD Brother   . Cancer Brother   . Cancer Maternal Grandmother   . Heart disease Maternal Grandmother   . Stroke Maternal Grandmother   . Cancer Maternal Grandfather   . Diabetes Maternal Grandfather   . Heart disease Maternal Grandfather    Social History   Socioeconomic History  . Marital status: Married    Spouse name: Not on file  . Number of children: 2  . Years of education: Not on file  . Highest education level: Not on file  Occupational History  . Not on file   Tobacco Use  . Smoking status: Former Smoker    Quit date: 09/02/1978    Years since quitting: 42.0  . Smokeless tobacco: Never Used  . Tobacco comment: quit 40 yrs  Vaping Use  . Vaping Use: Never used  Substance and Sexual Activity  . Alcohol use: Yes    Alcohol/week: 3.0 standard drinks    Types: 3 Glasses of wine per week    Comment: occasionally  . Drug use: No  . Sexual activity: Yes  Other Topics Concern  . Not on file  Social History Narrative  . Not on file   Social Determinants of Health   Financial Resource Strain: Low Risk   . Difficulty of Paying Living Expenses: Not hard at all  Food Insecurity: No Food Insecurity  . Worried About Charity fundraiser in the Last Year: Never true  . Ran Out of Food in the Last Year: Never true  Transportation Needs: No Transportation Needs  . Lack of Transportation (Medical): No  . Lack of Transportation (Non-Medical): No  Physical Activity: Inactive  . Days of Exercise per Week: 0 days  . Minutes of Exercise per Session: 0 min  Stress: No Stress Concern Present  . Feeling of Stress : Not at all  Social Connections: Moderately Integrated  . Frequency of Communication with Friends and Family: More than three times a week  . Frequency of Social Gatherings with Friends and Family: Once a week  . Attends Religious Services: More than 4 times per year  . Active Member of Clubs or Organizations: No  . Attends Archivist Meetings: Never  . Marital Status: Married    Tobacco Counseling Counseling given: Not Answered Comment: quit 40 yrs   Clinical Intake:  Pre-visit preparation completed: Yes  Pain : No/denies pain     BMI - recorded: 26.46 Nutritional Status: BMI 25 -29 Overweight Nutritional Risks: None Diabetes: No  How often do you need to have someone help you when you read instructions, pamphlets, or other written materials from your doctor or pharmacy?: 1 - Never    Interpreter Needed?:  No  Information entered by :: Clemetine Marker LPN   Activities of Daily Living In your present state of health, do you have any difficulty performing the following activities: 08/29/2020  Hearing? Y  Comment wears hearing aids  Vision? N  Difficulty concentrating or making decisions? Y  Walking or climbing stairs? N  Dressing or bathing? N  Doing errands, shopping? N  Preparing Food and eating ? N  Using the Toilet? N  In the past six months, have you accidently leaked urine? N  Do you have problems with loss of bowel control? N  Managing your Medications? N  Managing your Finances? N  Housekeeping or managing your Housekeeping? N  Some recent data might be hidden    Patient Care Team: Juline Patch, MD as PCP - General (  Family Medicine)  Indicate any recent Medical Services you may have received from other than [facility] providers in the past year (date may be approximate).     Assessment:   This is a routine wellness examination for Derek Figueroa.  Hearing/Vision screen  Hearing Screening   125Hz  250Hz  500Hz  1000Hz  2000Hz  3000Hz  4000Hz  6000Hz  8000Hz   Right ear:           Left ear:           Comments: Pt wears hearing aids from [employer]  Vision Screening Comments: Annual vision screenings done at [place] issues and exercise activities discussed: Current Exercise Habits: The patient does not participate in regular exercise at present (does yard work), Exercise limited by: None identified  Goals    . Weight (lb) < 170 lb (77.1 kg)     Pt would like to lose weight over the next year with healthy eating and physical activity       Depression Screen PHQ 2/9 Scores 08/29/2020 04/05/2020 08/30/2019 08/24/2019 07/26/2019 05/13/2019 03/01/2019  PHQ - 2 Score 0 0 0 0 0 0 1  PHQ- 9 Score - 0 0 - 0 - 1    Fall Risk Fall Risk  08/29/2020 04/05/2020 08/30/2019 08/24/2019 07/06/2018  Falls in the past year? 0 0 0 0 0  Number falls in past yr: 0 - - 0 -  Injury with Fall? 0 - - 0  -  Risk for fall due to : No Fall Risks - - No Fall Risks -  Follow up Falls prevention discussed Falls evaluation completed Falls evaluation completed Falls prevention discussed Falls evaluation completed    FALL RISK PREVENTION PERTAINING TO THE HOME:  Any stairs in or around the home? Yes  If so, are there any without handrails? No  Home free of loose throw rugs in walkways, pet beds, electrical cords, etc? Yes  Adequate lighting in your home to reduce risk of falls? Yes   ASSISTIVE DEVICES UTILIZED TO PREVENT FALLS:  Life alert? No  Use of a cane, walker or w/c? No  Grab bars in the bathroom? Yes  Shower chair or bench in shower? No  Elevated toilet seat or a handicapped toilet? Yes   TIMED UP AND GO:  Was the test performed? Yes .  Length of time to ambulate 10 feet: 4 sec.   Gait steady and fast without use of assistive device  Cognitive Function:     6CIT Screen 08/29/2020  What Year? 0 points  What month? 0 points  What time? 0 points  Count back from 20 0 points  Months in reverse 0 points  Repeat phrase 0 points  Total Score 0    Immunizations Immunization History  Administered Date(s) Administered  . Hepatitis A, Adult 04/01/1997, 04/28/1998  . Influenza Whole 02/24/1996, 03/27/1996, 03/31/1997, 04/01/1998, 04/27/1999, 03/28/2000, 04/11/2001, 04/11/2002, 06/03/2003  . Influenza,inj,Quad PF,6+ Mos 03/16/2014  . MMR 06/06/2000  . Meningococcal Polysaccharide 04/28/1998  . Moderna Sars-Covid-2 Vaccination 09/27/2019, 10/25/2019  . OPV 06/26/1972, 06/28/1972  . PPD Test 07/01/1998, 02/27/2001, 02/18/2002, 02/05/2003  . Pneumococcal Conjugate-13 07/06/2018  . Td 08/24/1992, 09/01/1992, 06/04/2002  . Tdap 06/20/2013  . Typhoid Inactivated 02/27/2001, 03/11/2003  . Typhoid Parenteral 07/01/1998  . Typhoid Parenteral, AKD ([place] [employer]) 07/25/1995, 07/27/1995  . Yellow Fever 06/26/1993, 07/06/1993, 06/01/1999    TDAP status: Up to date  Flu Vaccine  status: Declined, Education has been provided regarding the importance of this vaccine but patient still declined. Advised  may receive this vaccine at local pharmacy or Health Dept. Aware to provide a copy of the vaccination record if obtained from local pharmacy or Health Dept. Verbalized acceptance and understanding.  Pneumococcal vaccine status: Declined,  Education has been provided regarding the importance of this vaccine but patient still declined. Advised may receive this vaccine at local pharmacy or Health Dept. Aware to provide a copy of the vaccination record if obtained from local pharmacy or Health Dept. Verbalized acceptance and understanding.   Covid-19 vaccine status: Completed vaccines  Qualifies for Shingles Vaccine? Yes   Zostavax completed No   Shingrix Completed?: No.    Education has been provided regarding the importance of this vaccine. Patient has been advised to call insurance company to determine out of pocket expense if they have not yet received this vaccine. Advised may also receive vaccine at local pharmacy or Health Dept. Verbalized acceptance and understanding.  Screening Tests Health Maintenance  Topic Date Due  . COVID-19 Vaccine (3 - Moderna risk 4-dose series) 09/14/2020 (Originally 11/22/2019)  . PNA vac Low Risk Adult (2 of 2 - PPSV23) 08/29/2021 (Originally 07/07/2019)  . INFLUENZA VACCINE  12/24/2020  . TETANUS/TDAP  06/21/2023  . COLONOSCOPY (Pts 45-59yr Insurance coverage will need to be confirmed)  10/10/2024  . Hepatitis C Screening  Completed  . HPV VACCINES  Aged Out    Health Maintenance  There are no preventive care reminders to display for this patient.  Colorectal cancer screening: Type of screening: Colonoscopy. Completed 10/11/19. Repeat every 5 years  Lung Cancer Screening: (Low Dose CT Chest recommended if Age 68-80years, 30 pack-year currently smoking OR have quit w/in 15years.) does not qualify.   Additional Screening:  Hepatitis  C Screening: does qualify; Completed 09/23/13  Vision Screening: Recommended annual ophthalmology exams for early detection of glaucoma and other disorders of the eye. Is the patient up to date with their annual eye exam?  Yes  Who is the provider or what is the name of the office in which the patient attends annual eye exams[place]: Recommended annual dental exams for proper oral hygiene  Community Resource Referral / Chronic Care Management: CRR required this visit?  No   CCM required this visit?  No      Plan:     I have personally reviewed and noted the following in the patient's chart:   . Medical and social history . Use of alcohol, tobacco or illicit drugs  . Current medications and supplements . Functional ability and status . Nutritional status . Physical activity . Advanced directives . List of other physicians . Hospitalizations, surgeries, and ER visits in previous 12 months . Vitals . Screenings to include cognitive, depression, and falls . Referrals and appointments  In addition, I have reviewed and discussed with patient certain preventive protocols, quality metrics, and best practice recommendations. A written personalized care plan for preventive services as well as general preventive health recommendations were provided to patient.     KClemetine Marker LPN   [id number]  Nurse Notes: none

## 2020-08-29 NOTE — Patient Instructions (Signed)
Derek Figueroa , Thank you for taking time to come for your Medicare Wellness Visit. I appreciate your ongoing commitment to your health goals. Please review the following plan we discussed and let me know if I can assist you in the future.   Screening recommendations/referrals: Colonoscopy: done 10/11/19 repeat in 2026 Recommended yearly ophthalmology/optometry visit for glaucoma screening and checkup Recommended yearly dental visit for hygiene and checkup  Vaccinations: Influenza vaccine: declined Pneumococcal vaccine: done 07/06/18 Tdap vaccine: done 06/20/13 Shingles vaccine: Shingrix discussed. Please contact your pharmacy for coverage information.  Covid-19:  Done 09/27/19 & 10/25/19  Advanced directives: Please bring a copy of your health care power of attorney and living will to the office at your convenience.  Conditions/risks identified: Keep up the great work!  Next appointment: Follow up in one year for your annual wellness visit.   Preventive Care 38 Years and Older, Male Preventive care refers to lifestyle choices and visits with your health care provider that can promote health and wellness. What does preventive care include?  A yearly physical exam. This is also called an annual well check.  Dental exams once or twice a year.  Routine eye exams. Ask your health care provider how often you should have your eyes checked.  Personal lifestyle choices, including:  Daily care of your teeth and gums.  Regular physical activity.  Eating a healthy diet.  Avoiding tobacco and drug use.  Limiting alcohol use.  Practicing safe sex.  Taking low doses of aspirin every day.  Taking vitamin and mineral supplements as recommended by your health care provider. What happens during an annual well check? The services and screenings done by your health care provider during your annual well check will depend on your age, overall health, lifestyle risk factors, and family history of  disease. Counseling  Your health care provider may ask you questions about your:  Alcohol use.  Tobacco use.  Drug use.  Emotional well-being.  Home and relationship well-being.  Sexual activity.  Eating habits.  History of falls.  Memory and ability to understand (cognition).  Work and work Statistician. Screening  You may have the following tests or measurements:  Height, weight, and BMI.  Blood pressure.  Lipid and cholesterol levels. These may be checked every 5 years, or more frequently if you are over 69 years old.  Skin check.  Lung cancer screening. You may have this screening every year starting at age 15 if you have a 30-pack-year history of smoking and currently smoke or have quit within the past 15 years.  Fecal occult blood test (FOBT) of the stool. You may have this test every year starting at age 18.  Flexible sigmoidoscopy or colonoscopy. You may have a sigmoidoscopy every 5 years or a colonoscopy every 10 years starting at age 50.  Prostate cancer screening. Recommendations will vary depending on your family history and other risks.  Hepatitis C blood test.  Hepatitis B blood test.  Sexually transmitted disease (STD) testing.  Diabetes screening. This is done by checking your blood sugar (glucose) after you have not eaten for a while (fasting). You may have this done every 1-3 years.  Abdominal aortic aneurysm (AAA) screening. You may need this if you are a current or former smoker.  Osteoporosis. You may be screened starting at age 76 if you are at high risk. Talk with your health care provider about your test results, treatment options, and if necessary, the need for more tests. Vaccines  Your health  care provider may recommend certain vaccines, such as:  Influenza vaccine. This is recommended every year.  Tetanus, diphtheria, and acellular pertussis (Tdap, Td) vaccine. You may need a Td booster every 10 years.  Zoster vaccine. You may  need this after age 8.  Pneumococcal 13-valent conjugate (PCV13) vaccine. One dose is recommended after age 66.  Pneumococcal polysaccharide (PPSV23) vaccine. One dose is recommended after age 40. Talk to your health care provider about which screenings and vaccines you need and how often you need them. This information is not intended to replace advice given to you by your health care provider. Make sure you discuss any questions you have with your health care provider. Document Released: 06/08/2015 Document Revised: 01/30/2016 Document Reviewed: 03/13/2015 Elsevier Interactive Patient Education  2017 [place] Prevention in the Home Falls can cause injuries. They can happen to people of all ages. There are many things you can do to make your home safe and to help prevent falls. What can I do on the outside of my home?  Regularly fix the edges of walkways and driveways and fix any cracks.  Remove anything that might make you trip as you walk through a door, such as a raised step or threshold.  Trim any bushes or trees on the path to your home.  Use bright outdoor lighting.  Clear any walking paths of anything that might make someone trip, such as rocks or tools.  Regularly check to see if handrails are loose or broken. Make sure that both sides of any steps have handrails.  Any raised decks and porches should have guardrails on the edges.  Have any leaves, snow, or ice cleared regularly.  Use sand or salt on walking paths during winter.  Clean up any spills in your garage right away. This includes oil or grease spills. What can I do in the bathroom?  Use night lights.  Install grab bars by the toilet and in the tub and shower. Do not use towel bars as grab bars.  Use non-skid mats or decals in the tub or shower.  If you need to sit down in the shower, use a plastic, non-slip stool.  Keep the floor dry. Clean up any water that spills on the floor as soon as it  happens.  Remove soap buildup in the tub or shower regularly.  Attach bath mats securely with double-sided non-slip rug tape.  Do not have throw rugs and other things on the floor that can make you trip. What can I do in the bedroom?  Use night lights.  Make sure that you have a light by your bed that is easy to reach.  Do not use any sheets or blankets that are too big for your bed. They should not hang down onto the floor.  Have a firm chair that has side arms. You can use this for support while you get dressed.  Do not have throw rugs and other things on the floor that can make you trip. What can I do in the kitchen?  Clean up any spills right away.  Avoid walking on wet floors.  Keep items that you use a lot in easy-to-reach places.  If you need to reach something above you, use a strong step stool that has a grab bar.  Keep electrical cords out of the way.  Do not use floor polish or wax that makes floors slippery. If you must use wax, use non-skid floor wax.  Do not have  throw rugs and other things on the floor that can make you trip. What can I do with my stairs?  Do not leave any items on the stairs.  Make sure that there are handrails on both sides of the stairs and use them. Fix handrails that are broken or loose. Make sure that handrails are as long as the stairways.  Check any carpeting to make sure that it is firmly attached to the stairs. Fix any carpet that is loose or worn.  Avoid having throw rugs at the top or bottom of the stairs. If you do have throw rugs, attach them to the floor with carpet tape.  Make sure that you have a light switch at the top of the stairs and the bottom of the stairs. If you do not have them, ask someone to add them for you. What else can I do to help prevent falls?  Wear shoes that:  Do not have high heels.  Have rubber bottoms.  Are comfortable and fit you well.  Are closed at the toe. Do not wear sandals.  If you  use a stepladder:  Make sure that it is fully opened. Do not climb a closed stepladder.  Make sure that both sides of the stepladder are locked into place.  Ask someone to hold it for you, if possible.  Clearly mark and make sure that you can see:  Any grab bars or handrails.  First and last steps.  Where the edge of each step is.  Use tools that help you move around (mobility aids) if they are needed. These include:  Canes.  Walkers.  Scooters.  Crutches.  Turn on the lights when you go into a dark area. Replace any light bulbs as soon as they burn out.  Set up your furniture so you have a clear path. Avoid moving your furniture around.  If any of your floors are uneven, fix them.  If there are any pets around you, be aware of where they are.  Review your medicines with your doctor. Some medicines can make you feel dizzy. This can increase your chance of falling. Ask your doctor what other things that you can do to help prevent falls. This information is not intended to replace advice given to you by your health care provider. Make sure you discuss any questions you have with your health care provider. Document Released: 03/08/2009 Document Revised: 10/18/2015 Document Reviewed: 06/16/2014 Elsevier Interactive Patient Education  2017 [employer].

## 2020-08-31 ENCOUNTER — Encounter: Payer: Medicare HMO | Admitting: Family Medicine

## 2020-09-04 ENCOUNTER — Encounter: Payer: Self-pay | Admitting: Family Medicine

## 2020-09-04 ENCOUNTER — Other Ambulatory Visit: Payer: Self-pay

## 2020-09-04 ENCOUNTER — Ambulatory Visit (INDEPENDENT_AMBULATORY_CARE_PROVIDER_SITE_OTHER): Payer: Medicare HMO | Admitting: Family Medicine

## 2020-09-04 VITALS — BP 120/80 | HR 72 | Ht 68.0 in | Wt 172.0 lb

## 2020-09-04 DIAGNOSIS — Z0000 Encounter for general adult medical examination without abnormal findings: Secondary | ICD-10-CM | POA: Diagnosis not present

## 2020-09-04 DIAGNOSIS — E7800 Pure hypercholesterolemia, unspecified: Secondary | ICD-10-CM

## 2020-09-04 DIAGNOSIS — R351 Nocturia: Secondary | ICD-10-CM

## 2020-09-04 DIAGNOSIS — I10 Essential (primary) hypertension: Secondary | ICD-10-CM | POA: Diagnosis not present

## 2020-09-04 LAB — HEMOCCULT GUIAC POC 1CARD (OFFICE): Fecal Occult Blood, POC: NEGATIVE

## 2020-09-04 NOTE — Progress Notes (Signed)
Date:  09/04/2020   Name:  Derek Figueroa   DOB:  1950/08/05   MRN:  [id number]   Chief Complaint: Annual Exam  Patient is a 70 year old male who presents for a comprehensive physical exam. The patient reports the following problems: none. Health maintenance has been reviewed up to date.   Lab Results  Component Value Date   CREATININE 0.91 08/30/2019   BUN 18 08/30/2019   NA 141 08/30/2019   K 4.8 08/30/2019   CL 104 08/30/2019   CO2 23 08/30/2019   Lab Results  Component Value Date   CHOL 206 (H) 08/30/2019   HDL 54 08/30/2019   LDLCALC 133 (H) 08/30/2019   TRIG 109 08/30/2019   CHOLHDL 3.8 08/20/2018   No results found for: TSH No results found for: HGBA1C Lab Results  Component Value Date   WBC 5.3 08/05/2017   HGB 15.9 08/05/2017   HCT 46.5 08/05/2017   MCV 84.3 08/05/2017   PLT 234 08/05/2017   Lab Results  Component Value Date   ALT 471 (H) 08/05/2017   AST 174 (H) 08/05/2017   ALKPHOS 208 (H) 08/05/2017   BILITOT 6.3 (H) 08/05/2017     Review of Systems  Constitutional: Negative for chills and fever.  HENT: Negative for drooling, ear discharge, ear pain, rhinorrhea and sore throat.   Respiratory: Negative for cough, shortness of breath and wheezing.   Cardiovascular: Negative for chest pain, palpitations and leg swelling.  Gastrointestinal: Negative for abdominal pain, blood in stool, constipation, diarrhea and nausea.  Endocrine: Negative for polydipsia.  Genitourinary: Negative for dysuria, frequency, hematuria and urgency.       Nocturia  Musculoskeletal: Negative for back pain, myalgias and neck pain.  Skin: Negative for rash.  Allergic/Immunologic: Negative for environmental allergies.  Neurological: Negative for dizziness and headaches.  Hematological: Does not bruise/bleed easily.  Psychiatric/Behavioral: Negative for suicidal ideas. The patient is not nervous/anxious.     Patient Active Problem List   Diagnosis Date Noted  .  Personal history of colonic polyps   . Polyp of transverse colon   . Cholecystitis, acute   . Obstruction of bile duct   . Jaundice   . Abnormal findings on imaging of biliary tract   . Residual hemorrhoidal skin tags 06/23/2014  . Right shoulder pain 01/05/2014  . Gastroesophageal reflux disease without esophagitis 01/05/2014  . Elevated blood pressure 01/05/2014  . History of adenomatous polyp of colon 05/26/2006    No Known Allergies  Past Surgical History:  Procedure Laterality Date  . COLONOSCOPY    . COLONOSCOPY WITH PROPOFOL N/A 10/11/2019   Procedure: COLONOSCOPY WITH PROPOFOL;  Surgeon: Lucilla Lame, MD;  Location: [facility] ENDOSCOPY;  Service: Endoscopy;  Laterality: N/A;  . ERCP N/A 08/13/2017   Procedure: ENDOSCOPIC RETROGRADE CHOLANGIOPANCREATOGRAPHY (ERCP);  Surgeon: Lucilla Lame, MD;  Location: [facility] ENDOSCOPY;  Service: Endoscopy;  Laterality: N/A;  . EYE SURGERY    . gall stones    . HERNIA REPAIR    . ROBOTIC ASSISTED LAPAROSCOPIC CHOLECYSTECTOMY-MULTI SITE N/A 10/20/2017   Procedure: ROBOTIC ASSISTED LAPAROSCOPIC CHOLECYSTECTOMY-MULTI SITE;  Surgeon: Jules Husbands, MD;  Location: [facility] ORS;  Service: General;  Laterality: N/A;  . UMBILICAL HERNIA REPAIR N/A 10/20/2017   Procedure: HERNIA REPAIR UMBILICAL ADULT;  Surgeon: Jules Husbands, MD;  Location: [facility] ORS;  Service: General;  Laterality: N/A;    Social History   Tobacco Use  . Smoking status: Former Smoker    Quit date:  09/02/1978    Years since quitting: 42.0  . Smokeless tobacco: Never Used  . Tobacco comment: quit 40 yrs  Vaping Use  . Vaping Use: Never used  Substance Use Topics  . Alcohol use: Yes    Alcohol/week: 3.0 standard drinks    Types: 3 Glasses of wine per week    Comment: occasionally  . Drug use: No     Medication list has been reviewed and updated.  Current Meds  Medication Sig  . latanoprost (XALATAN) 0.005 % ophthalmic solution Apply to eye. Eye doctor  . loratadine (CLARITIN) 10  MG tablet Take 10 mg by mouth daily.  . meloxicam (MOBIC) 15 MG tablet Take 1 tablet (15 mg total) by mouth daily.  . metoprolol succinate (TOPROL-XL) 50 MG 24 hr tablet TAKE 1 TABLET BY MOUTH ONCE DAILY. TAKE WITH OR IMMEDIATELY FOLLOWING MEAL  . Multiple Vitamins-Minerals (MULTIVITAMIN ADULT PO) Take 1 tablet by mouth daily.   . Omega-3 Fatty Acids (FISH OIL PO) Take 1 capsule by mouth daily.   Marland Kitchen triamcinolone (NASACORT) 55 MCG/ACT AERO nasal inhaler Place 2 sprays into the nose daily.    PHQ 2/9 Scores 09/04/2020 08/29/2020 04/05/2020 08/30/2019  PHQ - 2 Score 0 0 0 0  PHQ- 9 Score 0 - 0 0    GAD 7 : Generalized Anxiety Score 09/04/2020 04/05/2020 08/30/2019 07/26/2019  Nervous, Anxious, on Edge 0 0 0 0  Control/stop worrying 0 0 0 0  Worry too much - different things 0 0 0 0  Trouble relaxing 0 0 0 0  Restless 0 0 0 0  Easily annoyed or irritable 0 1 0 0  Afraid - awful might happen 0 0 0 0  Total GAD 7 Score 0 1 0 0  Anxiety Difficulty - - - -    BP Readings from Last 3 Encounters:  09/04/20 120/80  08/29/20 132/80  06/21/20 130/80    Physical Exam Vitals and nursing note reviewed.  Constitutional:      Appearance: Normal appearance. He is well-developed, well-groomed and normal weight.  HENT:     Head: Normocephalic.     Jaw: There is normal jaw occlusion.     Right Ear: Hearing, tympanic membrane, ear canal and external ear normal.     Left Ear: Hearing, tympanic membrane, ear canal and external ear normal.     Nose: Nose normal.     Right Turbinates: Not enlarged, swollen or pale.     Left Turbinates: Not enlarged, swollen or pale.     Mouth/Throat:     Lips: Pink.     Mouth: Mucous membranes are moist.     Dentition: Normal dentition.     Tongue: No lesions.     Palate: No mass.     Pharynx: Oropharynx is clear. Uvula midline. No pharyngeal swelling, oropharyngeal exudate, posterior oropharyngeal erythema or uvula swelling.  Eyes:     General: Lids are normal. Vision  grossly intact. Gaze aligned appropriately. No scleral icterus.       Right eye: No discharge.        Left eye: No discharge.     Extraocular Movements: Extraocular movements intact.     Conjunctiva/sclera: Conjunctivae normal.     Pupils: Pupils are equal, round, and reactive to light.     Funduscopic exam:    Right eye: Red reflex present.        Left eye: Red reflex present. Neck:     Thyroid: No thyroid mass, thyromegaly or  thyroid tenderness.     Vascular: Normal carotid pulses. No carotid bruit, hepatojugular reflux or JVD.     Trachea: Trachea and phonation normal. No tracheal deviation.  Cardiovascular:     Rate and Rhythm: Normal rate and regular rhythm.     Chest Wall: PMI is not displaced. No thrill.     Pulses: Normal pulses.          Carotid pulses are 2+ on the right side and 2+ on the left side.      Radial pulses are 2+ on the right side and 2+ on the left side.       Femoral pulses are 2+ on the right side and 2+ on the left side.      Popliteal pulses are 2+ on the right side and 2+ on the left side.       Dorsalis pedis pulses are 2+ on the right side and 2+ on the left side.       Posterior tibial pulses are 2+ on the right side and 2+ on the left side.     Heart sounds: Normal heart sounds, S1 normal and S2 normal. No murmur heard.  No systolic murmur is present.  No diastolic murmur is present. No friction rub. No gallop. No S3 or S4 sounds.   Pulmonary:     Effort: Pulmonary effort is normal. No respiratory distress.     Breath sounds: Normal breath sounds. No decreased breath sounds, wheezing, rhonchi or rales.  Chest:  Breasts: Breasts are symmetrical.     Right: Normal. No swelling, bleeding, inverted nipple, mass, axillary adenopathy or supraclavicular adenopathy.     Left: Normal. No swelling, bleeding, inverted nipple, mass, axillary adenopathy or supraclavicular adenopathy.    Abdominal:     General: Bowel sounds are normal.     Palpations: Abdomen  is soft. There is no hepatomegaly, splenomegaly or mass.     Tenderness: There is no abdominal tenderness. There is no guarding or rebound.     Hernia: No hernia is present. There is no hernia in the umbilical area, ventral area, left inguinal area or right inguinal area.  Genitourinary:    Penis: Normal and uncircumcised.      Testes: Normal.        Right: Mass or tenderness not present.        Left: Mass or tenderness not present.     Epididymis:     Right: Normal.     Left: Normal.     Prostate: Normal. Not enlarged, not tender and no nodules present.     Rectum: Normal. Guaiac result negative. No mass.  Musculoskeletal:        General: No tenderness. Normal range of motion.     Cervical back: Normal, full passive range of motion without pain, normal range of motion and neck supple.     Thoracic back: Normal.     Lumbar back: Normal.     Right lower leg: No edema.     Left lower leg: No edema.  Lymphadenopathy:     Head:     Right side of head: No submental, submandibular or tonsillar adenopathy.     Left side of head: No submental, submandibular or tonsillar adenopathy.     Cervical: No cervical adenopathy.     Right cervical: No superficial, deep or posterior cervical adenopathy.    Left cervical: No superficial, deep or posterior cervical adenopathy.     Upper Body:     Right  upper body: No supraclavicular or axillary adenopathy.     Left upper body: No supraclavicular or axillary adenopathy.     Lower Body: No right inguinal adenopathy. No left inguinal adenopathy.  Skin:    General: Skin is warm.     Capillary Refill: Capillary refill takes less than 2 seconds.     Findings: No rash.  Neurological:     Mental Status: He is alert and oriented to person, place, and time.     Cranial Nerves: Cranial nerves are intact. No cranial nerve deficit.     Sensory: Sensation is intact.     Motor: Motor function is intact.     Deep Tendon Reflexes: Reflexes are normal and  symmetric.     Reflex Scores:      Tricep reflexes are 2+ on the right side and 2+ on the left side.      Bicep reflexes are 2+ on the right side and 2+ on the left side.      Brachioradialis reflexes are 2+ on the right side and 2+ on the left side.      Patellar reflexes are 2+ on the right side and 2+ on the left side.      Achilles reflexes are 2+ on the right side and 2+ on the left side. Psychiatric:        Attention and Perception: Attention and perception normal.        Mood and Affect: Mood and affect normal.        Speech: Speech normal.        Behavior: Behavior is cooperative.     Wt Readings from Last 3 Encounters:  09/04/20 172 lb (78 kg)  08/29/20 174 lb (78.9 kg)  06/21/20 175 lb (79.4 kg)    BP 120/80   Pulse 72   Ht 5\' 8"  (1.727 m)   Wt 172 lb (78 kg)   BMI 26.15 kg/m   Assessment and Plan: Patient's chart was reviewed for previous encounters, most recent labs, most recent imaging, and care everywhere.  1. Annual physical exam Derek Figueroa is a 70 y.o. male who presents today for his Complete Annual Exam. He feels well. He reports exercising . He reports he is sleeping well. Immunizations are reviewed and recommendations provided.   Age appropriate screening tests are discussed. Counseling given for risk factor reduction interventions.  We will obtain labs including LDL lipid and comprehensive metabolic panel.- Lipid Panel With LDL/HDL Ratio - Comprehensive metabolic panel - POCT Occult Blood Stool  2. Essential hypertension Chronic.  Controlled.  Stable.  Blood pressure 120/80.  We will check CMP for electrolytes and GFR. - Comprehensive metabolic panel  3. Nocturia Chronic.  Episodic.  Patient has occasional nocturia and will check PSA.  DRE was noted to be normal today. - PSA  4. Elevated LDL cholesterol level Patient with elevated LDL cholesterol.  Will recheck lipid panel for further evaluation and treatment results. - Lipid Panel With  LDL/HDL Ratio

## 2020-09-05 LAB — COMPREHENSIVE METABOLIC PANEL
ALT: 19 IU/L (ref 0–44)
AST: 19 IU/L (ref 0–40)
Albumin/Globulin Ratio: 2.3 — ABNORMAL HIGH (ref 1.2–2.2)
Albumin: 4.5 g/dL (ref 3.8–4.8)
Alkaline Phosphatase: 58 IU/L (ref 44–121)
BUN/Creatinine Ratio: 20 (ref 10–24)
BUN: 17 mg/dL (ref 8–27)
Bilirubin Total: 0.5 mg/dL (ref 0.0–1.2)
CO2: 21 mmol/L (ref 20–29)
Calcium: 9.3 mg/dL (ref 8.6–10.2)
Chloride: 103 mmol/L (ref 96–106)
Creatinine, Ser: 0.86 mg/dL (ref 0.76–1.27)
Globulin, Total: 2 g/dL (ref 1.5–4.5)
Glucose: 93 mg/dL (ref 65–99)
Potassium: 4.3 mmol/L (ref 3.5–5.2)
Sodium: 139 mmol/L (ref 134–144)
Total Protein: 6.5 g/dL (ref 6.0–8.5)
eGFR: 95 mL/min/{1.73_m2} (ref >59)

## 2020-09-05 LAB — LIPID PANEL WITH LDL/HDL RATIO
Cholesterol, Total: 233 mg/dL — ABNORMAL HIGH (ref 100–199)
HDL: 58 mg/dL (ref >39)
LDL Chol Calc (NIH): 157 mg/dL — ABNORMAL HIGH (ref 0–99)
LDL/HDL Ratio: 2.7 ratio (ref 0.0–3.6)
Triglycerides: 104 mg/dL (ref 0–149)
VLDL Cholesterol Cal: 18 mg/dL (ref 5–40)

## 2020-09-05 LAB — PSA: Prostate Specific Ag, Serum: 2.3 ng/mL (ref 0.0–4.0)

## 2020-09-19 ENCOUNTER — Ambulatory Visit: Payer: Medicare HMO | Admitting: Family Medicine

## 2020-09-26 ENCOUNTER — Encounter: Payer: Self-pay | Admitting: Family Medicine

## 2020-09-26 ENCOUNTER — Other Ambulatory Visit: Payer: Self-pay

## 2020-09-26 ENCOUNTER — Ambulatory Visit (INDEPENDENT_AMBULATORY_CARE_PROVIDER_SITE_OTHER): Payer: Medicare HMO | Admitting: Family Medicine

## 2020-09-26 VITALS — BP 138/82 | HR 66 | Ht 68.0 in | Wt 172.0 lb

## 2020-09-26 DIAGNOSIS — I10 Essential (primary) hypertension: Secondary | ICD-10-CM

## 2020-09-26 DIAGNOSIS — G250 Essential tremor: Secondary | ICD-10-CM | POA: Diagnosis not present

## 2020-09-26 DIAGNOSIS — R2681 Unsteadiness on feet: Secondary | ICD-10-CM | POA: Diagnosis not present

## 2020-09-26 DIAGNOSIS — E7800 Pure hypercholesterolemia, unspecified: Secondary | ICD-10-CM

## 2020-09-26 MED ORDER — METOPROLOL SUCCINATE ER 50 MG PO TB24: ORAL_TABLET | ORAL | 1 refills | Status: DC

## 2020-09-26 NOTE — Progress Notes (Signed)
Date:  09/26/2020   Name:  Derek Figueroa   DOB:  [birth year]   MRN:  [id number]   Chief Complaint: Hypertension (Metoprolol Refill.) and off balance (No falls, just feeling off balance. Wondering if BP is running low. Fatigued- wife did just have surgery. )  Hypertension This is a chronic problem. The current episode started more than 1 year ago. The problem has been gradually improving since onset. The problem is controlled. Pertinent negatives include no anxiety, blurred vision, chest pain, headaches, malaise/fatigue, neck pain, orthopnea, palpitations, peripheral edema, PND, shortness of breath or sweats. There are no associated agents to hypertension. There are no compliance problems.  There is no history of angina, kidney disease, CAD/MI, CVA, heart failure, left ventricular hypertrophy, PVD or retinopathy. There is no history of chronic renal disease, a hypertension causing med or renovascular disease.  Hyperlipidemia This is a chronic problem. The current episode started more than 1 year ago. The problem is controlled. Recent lipid tests were reviewed and are normal. He has no history of chronic renal disease, diabetes, hypothyroidism or liver disease. Pertinent negatives include no chest pain, focal sensory loss, focal weakness, myalgias or shortness of breath. Current antihyperlipidemic treatment includes diet change. There are no compliance problems.   Neurologic Problem The patient's primary symptoms include clumsiness and a loss of balance. The patient's pertinent negatives include no altered mental status, focal sensory loss, focal weakness, memory loss, near-syncope, slurred speech, syncope, visual change or weakness. This is a new problem. The current episode started 1 to 4 weeks ago ("maybe a month"). The problem has been waxing and waning since onset. There was no focality noted. Pertinent negatives include no abdominal pain, auditory change, back pain, bladder incontinence, bowel  incontinence, chest pain, dizziness, fever, headaches, nausea, neck pain, palpitations, shortness of breath or vertigo. There is no history of liver disease.    Lab Results  Component Value Date   CREATININE 0.86 09/04/2020   BUN 17 09/04/2020   NA 139 09/04/2020   K 4.3 09/04/2020   CL 103 09/04/2020   CO2 21 09/04/2020   Lab Results  Component Value Date   CHOL 233 (H) 09/04/2020   HDL 58 09/04/2020   LDLCALC 157 (H) 09/04/2020   TRIG 104 09/04/2020   CHOLHDL 3.8 08/20/2018   No results found for: TSH No results found for: HGBA1C Lab Results  Component Value Date   WBC 5.3 08/05/2017   HGB 15.9 08/05/2017   HCT 46.5 08/05/2017   MCV 84.3 08/05/2017   PLT 234 08/05/2017   Lab Results  Component Value Date   ALT 19 09/04/2020   AST 19 09/04/2020   ALKPHOS 58 09/04/2020   BILITOT 0.5 09/04/2020     Review of Systems  Constitutional: Negative for chills, fever and malaise/fatigue.  HENT: Negative for drooling, ear discharge, ear pain and sore throat.   Eyes: Negative for blurred vision.  Respiratory: Negative for cough, shortness of breath and wheezing.   Cardiovascular: Negative for chest pain, palpitations, orthopnea, leg swelling, PND and near-syncope.  Gastrointestinal: Negative for abdominal pain, blood in stool, bowel incontinence, constipation, diarrhea and nausea.  Endocrine: Negative for polydipsia.  Genitourinary: Negative for bladder incontinence, dysuria, frequency, hematuria and urgency.  Musculoskeletal: Negative for back pain, myalgias and neck pain.  Skin: Negative for rash.  Allergic/Immunologic: Negative for environmental allergies.  Neurological: Positive for loss of balance. Negative for dizziness, vertigo, focal weakness, syncope, weakness and headaches.  Hematological: Does not bruise/bleed easily.  Psychiatric/Behavioral: Negative for memory loss and suicidal ideas. The patient is not nervous/anxious.     Patient Active Problem List    Diagnosis Date Noted  . Personal history of colonic polyps   . Polyp of transverse colon   . Cholecystitis, acute   . Obstruction of bile duct   . Jaundice   . Abnormal findings on imaging of biliary tract   . Residual hemorrhoidal skin tags 06/23/2014  . Right shoulder pain 01/05/2014  . Gastroesophageal reflux disease without esophagitis 01/05/2014  . Elevated blood pressure 01/05/2014  . History of adenomatous polyp of colon 05/26/2006    No Known Allergies  Past Surgical History:  Procedure Laterality Date  . COLONOSCOPY    . COLONOSCOPY WITH PROPOFOL N/A 10/11/2019   Procedure: COLONOSCOPY WITH PROPOFOL;  Surgeon: Lucilla Lame, MD;  Location: [facility] ENDOSCOPY;  Service: Endoscopy;  Laterality: N/A;  . ERCP N/A 08/13/2017   Procedure: ENDOSCOPIC RETROGRADE CHOLANGIOPANCREATOGRAPHY (ERCP);  Surgeon: Lucilla Lame, MD;  Location: [facility] ENDOSCOPY;  Service: Endoscopy;  Laterality: N/A;  . EYE SURGERY    . gall stones    . HERNIA REPAIR    . ROBOTIC ASSISTED LAPAROSCOPIC CHOLECYSTECTOMY-MULTI SITE N/A 10/20/2017   Procedure: ROBOTIC ASSISTED LAPAROSCOPIC CHOLECYSTECTOMY-MULTI SITE;  Surgeon: Jules Husbands, MD;  Location: [facility] ORS;  Service: General;  Laterality: N/A;  . UMBILICAL HERNIA REPAIR N/A 10/20/2017   Procedure: HERNIA REPAIR UMBILICAL ADULT;  Surgeon: Jules Husbands, MD;  Location: [facility] ORS;  Service: General;  Laterality: N/A;    Social History   Tobacco Use  . Smoking status: Former Smoker    Quit date: 09/02/1978    Years since quitting: 42.0  . Smokeless tobacco: Never Used  . Tobacco comment: quit 40 yrs  Vaping Use  . Vaping Use: Never used  Substance Use Topics  . Alcohol use: Yes    Alcohol/week: 3.0 standard drinks    Types: 3 Glasses of wine per week    Comment: occasionally  . Drug use: No     Medication list has been reviewed and updated.  Current Meds  Medication Sig  . fluticasone (FLONASE) 50 MCG/ACT nasal spray Place into both nostrils daily.   Marland Kitchen latanoprost (XALATAN) 0.005 % ophthalmic solution Apply to eye. Eye doctor  . loratadine (CLARITIN) 10 MG tablet Take 10 mg by mouth daily.  . meloxicam (MOBIC) 15 MG tablet Take 1 tablet (15 mg total) by mouth daily.  . metoprolol succinate (TOPROL-XL) 50 MG 24 hr tablet TAKE 1 TABLET BY MOUTH ONCE DAILY. TAKE WITH OR IMMEDIATELY FOLLOWING MEAL  . Multiple Vitamins-Minerals (MULTIVITAMIN ADULT PO) Take 1 tablet by mouth daily.   . Omega-3 Fatty Acids (FISH OIL PO) Take 1 capsule by mouth daily.   . [DISCONTINUED] triamcinolone (NASACORT) 55 MCG/ACT AERO nasal inhaler Place 2 sprays into the nose daily.    PHQ 2/9 Scores 09/26/2020 09/04/2020 08/29/2020 04/05/2020  PHQ - 2 Score 0 0 0 0  PHQ- 9 Score 0 0 - 0    GAD 7 : Generalized Anxiety Score 09/26/2020 09/04/2020 04/05/2020 08/30/2019  Nervous, Anxious, on Edge 0 0 0 0  Control/stop worrying 0 0 0 0  Worry too much - different things 0 0 0 0  Trouble relaxing 0 0 0 0  Restless 0 0 0 0  Easily annoyed or irritable 0 0 1 0  Afraid - awful might happen 0 0 0 0  Total GAD 7 Score 0 0 1 0  Anxiety Difficulty Not difficult  at all - - -    BP Readings from Last 3 Encounters:  09/26/20 138/82  09/04/20 120/80  08/29/20 132/80    Physical Exam Vitals and nursing note reviewed.  HENT:     Head: Normocephalic.     Right Ear: Tympanic membrane, ear canal and external ear normal.     Left Ear: Tympanic membrane, ear canal and external ear normal.     Nose: Nose normal. No congestion or rhinorrhea.  Eyes:     General: No visual field deficit or scleral icterus.       Right eye: No discharge.        Left eye: No discharge.     Conjunctiva/sclera: Conjunctivae normal.     Pupils: Pupils are equal, round, and reactive to light.  Neck:     Thyroid: No thyromegaly.     Vascular: No JVD.     Trachea: No tracheal deviation.  Cardiovascular:     Rate and Rhythm: Normal rate and regular rhythm.     Heart sounds: Normal heart sounds. No  murmur heard. No friction rub. No gallop.   Pulmonary:     Effort: No respiratory distress.     Breath sounds: Normal breath sounds. No wheezing, rhonchi or rales.  Abdominal:     General: Bowel sounds are normal.     Palpations: Abdomen is soft. There is no mass.     Tenderness: There is no abdominal tenderness. There is no guarding or rebound.  Musculoskeletal:        General: No tenderness. Normal range of motion.     Cervical back: Normal range of motion and neck supple.  Lymphadenopathy:     Cervical: No cervical adenopathy.  Skin:    General: Skin is warm.     Findings: No rash.  Neurological:     Mental Status: He is alert and oriented to person, place, and time.     Cranial Nerves: No cranial nerve deficit, dysarthria or facial asymmetry.     Sensory: Sensation is intact. No sensory deficit.     Motor: Motor function is intact. No weakness or abnormal muscle tone.     Coordination: Romberg sign negative. Coordination normal.     Deep Tendon Reflexes: Reflexes are normal and symmetric.     Wt Readings from Last 3 Encounters:  09/26/20 172 lb (78 kg)  09/04/20 172 lb (78 kg)  08/29/20 174 lb (78.9 kg)    BP 138/82   Pulse 66   Ht 5\' 8"  (1.727 m)   Wt 172 lb (78 kg)   BMI 26.15 kg/m   Assessment and Plan:  1. Essential hypertension Chronic.  Controlled.  Stable.  Blood pressure today is 138/82.  Continue metoprolol XL 50 mg once a day. - metoprolol succinate (TOPROL-XL) 50 MG 24 hr tablet; TAKE 1 TABLET BY MOUTH ONCE DAILY. TAKE WITH OR IMMEDIATELY FOLLOWING MEAL  Dispense: 90 tablet; Refill: 1  2. Essential tremor Chronic.  Controlled.  Stable.  Patient has essential tremor but it has controlled somewhat since the initiation of Toprol-XL 50 mg daily. - metoprolol succinate (TOPROL-XL) 50 MG 24 hr tablet; TAKE 1 TABLET BY MOUTH ONCE DAILY. TAKE WITH OR IMMEDIATELY FOLLOWING MEAL  Dispense: 90 tablet; Refill: 1  3. Unstable gait Patient has noticed some issues  with his gait that he always feels like he is clumsy or that he is almost tripping.  There is no evidence of foot drop and neurologic exam is fully normal as  far as motor and sensory and reflexes.  Romberg is negative today and there is no motor deficit.  We will continue to watch this and if it progresses or does not completely resolve our next step will be neurology consult.  4. Elevated LDL cholesterol level Patient's had elevated cholesterol readings in the past particularly the LDL.  We will check lipid panel for current status of LDL. - Lipid Panel With LDL/HDL Ratio

## 2020-09-26 NOTE — Patient Instructions (Signed)
Ataxia  Ataxia is a condition that causes unsteadiness when walking and standing, poor coordination of body movements, and difficulty keeping a straight (upright) posture. It occurs because of a problem with the part of the brain that controls coordination and stability (cerebellum). Ataxia can develop later in life (acquired ataxia), during your 20s or 30s or even into your 60s or later. This type of ataxia develops when another medical condition, such as a stroke, damages the cerebellum. Ataxia also may be present early in life (non-acquired ataxia). There are two main types of non-acquired ataxia:  Congenital. This type is present at birth.  Hereditary. This type is passed from parent to child. The most common form of hereditary non-acquired ataxia is Friedreich ataxia. What are the causes? Acquired ataxia may be caused by:  Changes in the nervous system (neurodegenerative changes).  Changes throughout the body (systemic disorders).  A lot of exposure to: ? Certain medicines such as phenytoin and lithium. ? Solvents. These are cleaning fluids such as paint thinner, nail polish remover, carpet cleaner, and degreasers.  Alcohol abuse (alcoholism).  Medical conditions, such as: ? Celiac disease. ? Hypothyroidism. ? A lack (deficiency) of vitamin E, vitamin B12, or thiamine. ? Brain tumors. ? Multiple sclerosis. ? Cerebral palsy. ? Stroke. ? Paraneoplastic syndromes. ? Viral infections. ? Head injury. ? Malnutrition. Congenital and hereditary ataxia are caused by problems that are present in genes before birth. What are the signs or symptoms? Signs and symptoms of ataxia vary depending on the cause. They may include:  Being unsteady.  Walking with the legs wide apart (wide stance) to keep one's balance.  Uncontrolled shaking (tremor).  Poorly coordinated body movements.  Difficulty maintaining an upright posture.  Fatigue.  Changes in speech.  Changes in  vision.  Involuntary eye movements (nystagmus).  Difficulty swallowing.  Difficulty writing.  Muscle tightening that you cannot control (muscle spasms). How is this diagnosed? Ataxia may be diagnosed based on:  Your personal and family medical history.  A physical exam.  Imaging tests, such as a CT scan or MRI.  Spinal tap (lumbar puncture). This procedure involves using a needle to take a sample of the fluid around your brain and spinal cord.  Genetic testing. How is this treated? The underlying condition that causes your ataxia needs to be treated. If the cause is a brain tumor, you may need surgery. Treatment also focuses on helping you live with ataxia and improving your quality of life (supportive treatments). This may involve:  Learning ways to improve coordination and move around more carefully (physical therapy).  Learning ways to improve your ability to do daily tasks, such as bathing and feeding yourself (occupational therapy).  Using devices to help you move around, eat, or communicate (assistive devices), such as a walker, modified eating utensils, and communication aids.  Learning ways to improve speech and swallowing (speech therapy). Follow these instructions at home: Preventing falls  Lie down right away if you become very unsteady, dizzy, or nauseous, or if you feel like you are going to faint. Do not get up until all of those feelings pass.  Keep your home well-lit. Use night-lights as needed.  Remove tripping hazards, such as rugs, cords, and clutter.  Install grab bars by the toilet and in the tub and shower.  Use assistive devices such as a cane, walker, or wheelchair as needed to keep your balance. General instructions  Do not drink alcohol.  Ask your health care provider what activities are safe   for you, and what activities you should avoid.  Take over-the-counter and prescription medicines only as told by your health care provider. Get help  right away if you:  Have unsteadiness that suddenly worsens.  Have any of these: ? Severe headaches. ? Chest pain. ? Abdominal pain. ? Weakness or numbness on one side of your body. ? Vision problems. ? Difficulty speaking. ? An irregular heartbeat. ? A very fast pulse.  Feel confused. Summary  Ataxia is a condition that causes unsteadiness when walking and standing, poor coordination of body movements, and difficulty keeping a straight (upright) posture.  Ataxia occurs because of a problem with the part of the brain that controls coordination and stability (cerebellum).  The underlying condition that causes your ataxia needs to be treated. Treatment also focuses on helping you live with ataxia and improving your quality of life (supportive treatments).  Lie down right away if you become very unsteady, dizzy, or nauseous, or if you feel like you are going to faint. This information is not intended to replace advice given to you by your health care provider. Make sure you discuss any questions you have with your health care provider. Document Revised: 02/22/2020 Document Reviewed: 03/13/2017 Elsevier Patient Education  2021 [employer]vier Inc.  

## 2020-09-27 DIAGNOSIS — E7800 Pure hypercholesterolemia, unspecified: Secondary | ICD-10-CM | POA: Diagnosis not present

## 2020-09-28 LAB — LIPID PANEL WITH LDL/HDL RATIO
Cholesterol, Total: 192 mg/dL (ref 100–199)
HDL: 51 mg/dL (ref >39)
LDL Chol Calc (NIH): 124 mg/dL — ABNORMAL HIGH (ref 0–99)
LDL/HDL Ratio: 2.4 ratio (ref 0.0–3.6)
Triglycerides: 93 mg/dL (ref 0–149)
VLDL Cholesterol Cal: 17 mg/dL (ref 5–40)

## 2020-10-01 ENCOUNTER — Encounter: Payer: Self-pay | Admitting: Family Medicine

## 2020-11-05 ENCOUNTER — Encounter: Payer: Self-pay | Admitting: Family Medicine

## 2020-11-05 ENCOUNTER — Other Ambulatory Visit: Payer: Self-pay

## 2020-11-05 DIAGNOSIS — G250 Essential tremor: Secondary | ICD-10-CM

## 2020-11-05 DIAGNOSIS — I10 Essential (primary) hypertension: Secondary | ICD-10-CM

## 2020-11-05 MED ORDER — METOPROLOL SUCCINATE ER 50 MG PO TB24: ORAL_TABLET | ORAL | 0 refills | Status: DC

## 2021-02-05 ENCOUNTER — Other Ambulatory Visit: Payer: Self-pay | Admitting: Family Medicine

## 2021-02-05 DIAGNOSIS — G250 Essential tremor: Secondary | ICD-10-CM

## 2021-02-05 DIAGNOSIS — I10 Essential (primary) hypertension: Secondary | ICD-10-CM

## 2021-02-05 MED ORDER — METOPROLOL SUCCINATE ER 50 MG PO TB24: ORAL_TABLET | ORAL | 0 refills | Status: DC

## 2021-04-02 ENCOUNTER — Encounter: Payer: Self-pay | Admitting: Family Medicine

## 2021-04-02 ENCOUNTER — Other Ambulatory Visit: Payer: Self-pay

## 2021-04-02 ENCOUNTER — Ambulatory Visit (INDEPENDENT_AMBULATORY_CARE_PROVIDER_SITE_OTHER): Payer: Medicare HMO | Admitting: Family Medicine

## 2021-04-02 VITALS — BP 128/80 | HR 80 | Ht 68.0 in | Wt 177.0 lb

## 2021-04-02 DIAGNOSIS — G250 Essential tremor: Secondary | ICD-10-CM | POA: Diagnosis not present

## 2021-04-02 DIAGNOSIS — R079 Chest pain, unspecified: Secondary | ICD-10-CM

## 2021-04-02 DIAGNOSIS — I10 Essential (primary) hypertension: Secondary | ICD-10-CM | POA: Diagnosis not present

## 2021-04-02 MED ORDER — METOPROLOL SUCCINATE ER 50 MG PO TB24: ORAL_TABLET | ORAL | 1 refills | Status: DC

## 2021-04-02 NOTE — Progress Notes (Signed)
Date:  04/02/2021   Name:  Derek Figueroa   DOB:  1951-01-28   MRN:  [id number]   Chief Complaint: Hypertension  Hypertension This is a chronic problem. The current episode started more than 1 year ago. The problem has been gradually improving since onset. The problem is controlled. Associated symptoms include chest pain. Pertinent negatives include no anxiety, blurred vision, headaches, malaise/fatigue, neck pain, orthopnea, palpitations, peripheral edema, PND or shortness of breath. Past treatments include beta blockers. The current treatment provides moderate improvement. There is no history of angina, kidney disease, CAD/MI, CVA, heart failure, left ventricular hypertrophy, PVD or retinopathy. There is no history of chronic renal disease, a hypertension causing med or renovascular disease.  Chest Pain  This is a recurrent problem. The current episode started more than 1 year ago. The onset quality is gradual. The problem occurs intermittently. The pain is present in the lateral region. The pain is at a severity of 1/10. The pain is mild. The quality of the pain is described as burning. The pain does not radiate. Pertinent negatives include no abdominal pain, back pain, cough, dizziness, exertional chest pressure, fever, headaches, malaise/fatigue, nausea, orthopnea, palpitations, PND, shortness of breath or sputum production. The pain is aggravated by food. Treatments tried: deep breathing.  His past medical history is significant for hypertension.  Pertinent negatives for past medical history include no PVD.   Lab Results  Component Value Date   CREATININE 0.86 09/04/2020   BUN 17 09/04/2020   NA 139 09/04/2020   K 4.3 09/04/2020   CL 103 09/04/2020   CO2 21 09/04/2020   Lab Results  Component Value Date   CHOL 192 09/27/2020   HDL 51 09/27/2020   LDLCALC 124 (H) 09/27/2020   TRIG 93 09/27/2020   CHOLHDL 3.8 08/20/2018   No results found for: TSH No results found for:  HGBA1C Lab Results  Component Value Date   WBC 5.3 08/05/2017   HGB 15.9 08/05/2017   HCT 46.5 08/05/2017   MCV 84.3 08/05/2017   PLT 234 08/05/2017   Lab Results  Component Value Date   ALT 19 09/04/2020   AST 19 09/04/2020   ALKPHOS 58 09/04/2020   BILITOT 0.5 09/04/2020     Review of Systems  Constitutional:  Negative for chills, fever and malaise/fatigue.  HENT:  Negative for drooling, ear discharge, ear pain and sore throat.   Eyes:  Negative for blurred vision.  Respiratory:  Negative for cough, sputum production, shortness of breath and wheezing.   Cardiovascular:  Positive for chest pain. Negative for palpitations, orthopnea, leg swelling and PND.  Gastrointestinal:  Negative for abdominal pain, blood in stool, constipation, diarrhea and nausea.  Endocrine: Negative for polydipsia.  Genitourinary:  Negative for dysuria, frequency, hematuria and urgency.  Musculoskeletal:  Negative for back pain, myalgias and neck pain.  Skin:  Negative for rash.  Allergic/Immunologic: Negative for environmental allergies.  Neurological:  Negative for dizziness and headaches.  Hematological:  Does not bruise/bleed easily.  Psychiatric/Behavioral:  Negative for suicidal ideas. The patient is not nervous/anxious.    Patient Active Problem List   Diagnosis Date Noted   Personal history of colonic polyps    Polyp of transverse colon    Cholecystitis, acute    Obstruction of bile duct    Jaundice    Abnormal findings on imaging of biliary tract    Residual hemorrhoidal skin tags 06/23/2014   Right shoulder pain 01/05/2014   Gastroesophageal reflux disease  without esophagitis 01/05/2014   Elevated blood pressure 01/05/2014   History of adenomatous polyp of colon 05/26/2006    No Known Allergies  Past Surgical History:  Procedure Laterality Date   COLONOSCOPY     COLONOSCOPY WITH PROPOFOL N/A 10/11/2019   Procedure: COLONOSCOPY WITH PROPOFOL;  Surgeon: Lucilla Lame, MD;   Location: [facility] ENDOSCOPY;  Service: Endoscopy;  Laterality: N/A;   ERCP N/A 08/13/2017   Procedure: ENDOSCOPIC RETROGRADE CHOLANGIOPANCREATOGRAPHY (ERCP);  Surgeon: Lucilla Lame, MD;  Location: [facility] ENDOSCOPY;  Service: Endoscopy;  Laterality: N/A;   EYE SURGERY     gall stones     HERNIA REPAIR     ROBOTIC ASSISTED LAPAROSCOPIC CHOLECYSTECTOMY-MULTI SITE N/A 10/20/2017   Procedure: ROBOTIC ASSISTED LAPAROSCOPIC CHOLECYSTECTOMY-MULTI SITE;  Surgeon: Jules Husbands, MD;  Location: [facility] ORS;  Service: General;  Laterality: N/A;   UMBILICAL HERNIA REPAIR N/A 10/20/2017   Procedure: HERNIA REPAIR UMBILICAL ADULT;  Surgeon: Jules Husbands, MD;  Location: [facility] ORS;  Service: General;  Laterality: N/A;    Social History   Tobacco Use   Smoking status: Former    Types: Cigarettes    Quit date: 09/02/1978    Years since quitting: 42.6   Smokeless tobacco: Never   Tobacco comments:    quit 40 yrs  Vaping Use   Vaping Use: Never used  Substance Use Topics   Alcohol use: Yes    Alcohol/week: 3.0 standard drinks    Types: 3 Glasses of wine per week    Comment: occasionally   Drug use: No     Medication list has been reviewed and updated.  Current Meds  Medication Sig   fluticasone (FLONASE) 50 MCG/ACT nasal spray Place into both nostrils daily.   latanoprost (XALATAN) 0.005 % ophthalmic solution Apply to eye. Eye doctor   loratadine (CLARITIN) 10 MG tablet Take 10 mg by mouth daily.   meloxicam (MOBIC) 15 MG tablet Take 1 tablet (15 mg total) by mouth daily.   metoprolol succinate (TOPROL-XL) 50 MG 24 hr tablet TAKE 1 TABLET BY MOUTH ONCE DAILY. TAKE WITH OR IMMEDIATELY FOLLOWING MEAL   Multiple Vitamins-Minerals (MULTIVITAMIN ADULT PO) Take 1 tablet by mouth daily.    Omega-3 Fatty Acids (FISH OIL PO) Take 1 capsule by mouth daily.     PHQ 2/9 Scores 04/02/2021 09/26/2020 09/04/2020 08/29/2020  PHQ - 2 Score 0 0 0 0  PHQ- 9 Score 0 0 0 -    GAD 7 : Generalized Anxiety Score 04/02/2021  09/26/2020 09/04/2020 04/05/2020  Nervous, Anxious, on Edge 0 0 0 0  Control/stop worrying 0 0 0 0  Worry too much - different things 0 0 0 0  Trouble relaxing 0 0 0 0  Restless 0 0 0 0  Easily annoyed or irritable 0 0 0 1  Afraid - awful might happen 0 0 0 0  Total GAD 7 Score 0 0 0 1  Anxiety Difficulty - Not difficult at all - -    BP Readings from Last 3 Encounters:  04/02/21 128/80  09/26/20 138/82  09/04/20 120/80    Physical Exam Vitals and nursing note reviewed.  HENT:     Head: Normocephalic.     Right Ear: Tympanic membrane, ear canal and external ear normal.     Left Ear: Tympanic membrane, ear canal and external ear normal.     Nose: Nose normal.  Eyes:     General: No scleral icterus.       Right eye: No discharge.  Left eye: No discharge.     Conjunctiva/sclera: Conjunctivae normal.     Pupils: Pupils are equal, round, and reactive to light.  Neck:     Thyroid: No thyromegaly.     Vascular: No JVD.     Trachea: No tracheal deviation.  Cardiovascular:     Rate and Rhythm: Normal rate and regular rhythm.     Heart sounds: Normal heart sounds. No murmur heard.   No friction rub. No gallop.  Pulmonary:     Effort: No respiratory distress.     Breath sounds: Normal breath sounds. No wheezing or rales.  Abdominal:     General: Bowel sounds are normal.     Palpations: Abdomen is soft. There is no mass.     Tenderness: There is no abdominal tenderness. There is no guarding or rebound.  Musculoskeletal:        General: No tenderness. Normal range of motion.     Cervical back: Normal range of motion and neck supple.  Lymphadenopathy:     Cervical: No cervical adenopathy.  Skin:    General: Skin is warm.     Findings: No rash.  Neurological:     Mental Status: He is alert and oriented to person, place, and time.     Cranial Nerves: No cranial nerve deficit.     Deep Tendon Reflexes: Reflexes are normal and symmetric.    Wt Readings from Last 3  Encounters:  04/02/21 177 lb (80.3 kg)  09/26/20 172 lb (78 kg)  09/04/20 172 lb (78 kg)    BP 128/80   Pulse 80   Ht _0  (1.727 m)   Wt 177 lb (80.3 kg)   BMI 26.91 kg/m   Assessment and Plan:  1. Essential hypertension Chronic.  Controlled.  Stable.  Blood pressure is 128/80.  Continue metoprolol XL 50 mg once a day.  Will check renal function panel. - metoprolol succinate (TOPROL-XL) 50 MG 24 hr tablet; TAKE 1 TABLET BY MOUTH ONCE DAILY. TAKE WITH OR IMMEDIATELY FOLLOWING MEAL  Dispense: 90 tablet; Refill: 1 - Renal Function Panel  2. Essential tremor Chronic.  Controlled.  Stable.  Uncomplicated there has been some decrease in the essential tremor.  Continue metoprolol XL 50 mg once a day and if tremor should worsen neck step will be a neurologic consult. - metoprolol succinate (TOPROL-XL) 50 MG 24 hr tablet; TAKE 1 TABLET BY MOUTH ONCE DAILY. TAKE WITH OR IMMEDIATELY FOLLOWING MEAL  Dispense: 90 tablet; Refill: 1  3. Chest pain, unspecified type Chronic.  Episodic.  Intermittent.  Mild.  Stable.  Is atypical in nature and location that is over in the left anterior lateral 10th intercostal space and rib area.  But no palpable tenderness in this area and no pulmonary concerns noted on auscultation and percussion.  EKG was done: Evaluation noted sinus rhythm with a rate of 53 likely secondary to metoprolol.  Intervals are normal axis is normal no LVH criteria met by voltage criteria.  There is no ischemic changes such as R wave progression delay, Q waves, or ST T wave changes.  There is  EKG for comparison from 2009 and there is no changes noted. - EKG 12-Lead

## 2021-04-03 DIAGNOSIS — I10 Essential (primary) hypertension: Secondary | ICD-10-CM | POA: Diagnosis not present

## 2021-04-04 LAB — RENAL FUNCTION PANEL
Albumin: 4.2 g/dL (ref 3.8–4.8)
BUN/Creatinine Ratio: 19 (ref 10–24)
BUN: 16 mg/dL (ref 8–27)
CO2: 24 mmol/L (ref 20–29)
Calcium: 9.3 mg/dL (ref 8.6–10.2)
Chloride: 106 mmol/L (ref 96–106)
Creatinine, Ser: 0.86 mg/dL (ref 0.76–1.27)
Glucose: 102 mg/dL — ABNORMAL HIGH (ref 70–99)
Phosphorus: 2.8 mg/dL (ref 2.8–4.1)
Potassium: 4.8 mmol/L (ref 3.5–5.2)
Sodium: 143 mmol/L (ref 134–144)
eGFR: 94 mL/min/{1.73_m2} (ref >59)

## 2021-06-03 DIAGNOSIS — H2513 Age-related nuclear cataract, bilateral: Secondary | ICD-10-CM | POA: Diagnosis not present

## 2021-06-03 DIAGNOSIS — H43822 Vitreomacular adhesion, left eye: Secondary | ICD-10-CM | POA: Diagnosis not present

## 2021-06-03 DIAGNOSIS — H40053 Ocular hypertension, bilateral: Secondary | ICD-10-CM | POA: Diagnosis not present

## 2021-06-03 DIAGNOSIS — H18593 Other hereditary corneal dystrophies, bilateral: Secondary | ICD-10-CM | POA: Diagnosis not present

## 2021-06-03 DIAGNOSIS — H40023 Open angle with borderline findings, high risk, bilateral: Secondary | ICD-10-CM | POA: Diagnosis not present

## 2021-06-05 ENCOUNTER — Encounter: Payer: Self-pay | Admitting: Family Medicine

## 2021-06-18 ENCOUNTER — Other Ambulatory Visit: Payer: Self-pay

## 2021-06-18 ENCOUNTER — Ambulatory Visit (INDEPENDENT_AMBULATORY_CARE_PROVIDER_SITE_OTHER): Payer: Medicare HMO | Admitting: Family Medicine

## 2021-06-18 ENCOUNTER — Encounter: Payer: Self-pay | Admitting: Family Medicine

## 2021-06-18 VITALS — BP 120/80 | HR 60 | Ht 68.0 in | Wt 183.0 lb

## 2021-06-18 DIAGNOSIS — J0100 Acute maxillary sinusitis, unspecified: Secondary | ICD-10-CM

## 2021-06-18 DIAGNOSIS — G250 Essential tremor: Secondary | ICD-10-CM | POA: Diagnosis not present

## 2021-06-18 DIAGNOSIS — I10 Essential (primary) hypertension: Secondary | ICD-10-CM

## 2021-06-18 MED ORDER — METOPROLOL SUCCINATE ER 50 MG PO TB24: ORAL_TABLET | ORAL | 1 refills | Status: DC

## 2021-06-18 MED ORDER — AZITHROMYCIN 250 MG PO TABS: ORAL_TABLET | ORAL | 0 refills | Status: AC

## 2021-06-18 NOTE — Progress Notes (Signed)
Date:  06/18/2021   Name:  Derek Figueroa   DOB:  1950/11/18   MRN:  [id number]   Chief Complaint: Hypertension  Hypertension This is a chronic problem. The current episode started more than 1 year ago. The problem has been gradually improving since onset. The problem is controlled. Pertinent negatives include no anxiety, blurred vision, chest pain, headaches, malaise/fatigue, neck pain, orthopnea, palpitations, peripheral edema, PND, shortness of breath or sweats. There are no associated agents to hypertension. There are no known risk factors for coronary artery disease. Past treatments include beta blockers. The current treatment provides moderate improvement. There are no compliance problems.  There is no history of angina, kidney disease, CAD/MI, CVA, heart failure, left ventricular hypertrophy, PVD or retinopathy. There is no history of chronic renal disease, a hypertension causing med or renovascular disease.  Sinusitis This is a new problem. The current episode started in the past 7 days. The problem has been waxing and waning since onset. There has been no fever. The pain is mild. Pertinent negatives include no chills, coughing, ear pain, headaches, neck pain, shortness of breath or sore throat.   Lab Results  Component Value Date   NA 143 04/03/2021   K 4.8 04/03/2021   CO2 24 04/03/2021   GLUCOSE 102 (H) 04/03/2021   BUN 16 04/03/2021   CREATININE 0.86 04/03/2021   CALCIUM 9.3 04/03/2021   EGFR 94 04/03/2021   GFRNONAA 88 08/30/2019   Lab Results  Component Value Date   CHOL 192 09/27/2020   HDL 51 09/27/2020   LDLCALC 124 (H) 09/27/2020   TRIG 93 09/27/2020   CHOLHDL 3.8 08/20/2018   No results found for: TSH No results found for: HGBA1C Lab Results  Component Value Date   WBC 5.3 08/05/2017   HGB 15.9 08/05/2017   HCT 46.5 08/05/2017   MCV 84.3 08/05/2017   PLT 234 08/05/2017   Lab Results  Component Value Date   ALT 19 09/04/2020   AST 19 09/04/2020    ALKPHOS 58 09/04/2020   BILITOT 0.5 09/04/2020   No results found for: 25OHVITD2, 25OHVITD3, VD25OH   Review of Systems  Constitutional:  Negative for chills, fever and malaise/fatigue.  HENT:  Negative for drooling, ear discharge, ear pain and sore throat.   Eyes:  Negative for blurred vision.  Respiratory:  Negative for cough, shortness of breath and wheezing.   Cardiovascular:  Negative for chest pain, palpitations, orthopnea, leg swelling and PND.  Gastrointestinal:  Negative for abdominal pain, blood in stool, constipation, diarrhea and nausea.  Endocrine: Negative for polydipsia.  Genitourinary:  Negative for dysuria, frequency, hematuria and urgency.  Musculoskeletal:  Negative for back pain, myalgias and neck pain.  Skin:  Negative for rash.  Allergic/Immunologic: Negative for environmental allergies.  Neurological:  Negative for dizziness and headaches.  Hematological:  Does not bruise/bleed easily.  Psychiatric/Behavioral:  Negative for suicidal ideas. The patient is not nervous/anxious.    Patient Active Problem List   Diagnosis Date Noted   Personal history of colonic polyps    Polyp of transverse colon    Cholecystitis, acute    Obstruction of bile duct    Jaundice    Abnormal findings on imaging of biliary tract    Residual hemorrhoidal skin tags 06/23/2014   Right shoulder pain 01/05/2014   Gastroesophageal reflux disease without esophagitis 01/05/2014   Elevated blood pressure 01/05/2014   History of adenomatous polyp of colon 05/26/2006    No Known Allergies  Past Surgical History:  Procedure Laterality Date   COLONOSCOPY     COLONOSCOPY WITH PROPOFOL N/A 10/11/2019   Procedure: COLONOSCOPY WITH PROPOFOL;  Surgeon: Lucilla Lame, MD;  Location: [facility] ENDOSCOPY;  Service: Endoscopy;  Laterality: N/A;   ERCP N/A 08/13/2017   Procedure: ENDOSCOPIC RETROGRADE CHOLANGIOPANCREATOGRAPHY (ERCP);  Surgeon: Lucilla Lame, MD;  Location: [facility] ENDOSCOPY;  Service:  Endoscopy;  Laterality: N/A;   EYE SURGERY     gall stones     HERNIA REPAIR     ROBOTIC ASSISTED LAPAROSCOPIC CHOLECYSTECTOMY-MULTI SITE N/A 10/20/2017   Procedure: ROBOTIC ASSISTED LAPAROSCOPIC CHOLECYSTECTOMY-MULTI SITE;  Surgeon: Jules Husbands, MD;  Location: [facility] ORS;  Service: General;  Laterality: N/A;   UMBILICAL HERNIA REPAIR N/A 10/20/2017   Procedure: HERNIA REPAIR UMBILICAL ADULT;  Surgeon: Jules Husbands, MD;  Location: [facility] ORS;  Service: General;  Laterality: N/A;    Social History   Tobacco Use   Smoking status: Former    Types: Cigarettes    Quit date: 09/02/1978    Years since quitting: 42.8   Smokeless tobacco: Never   Tobacco comments:    quit 40 yrs  Vaping Use   Vaping Use: Never used  Substance Use Topics   Alcohol use: Yes    Alcohol/week: 3.0 standard drinks    Types: 3 Glasses of wine per week    Comment: occasionally   Drug use: No     Medication list has been reviewed and updated.  Current Meds  Medication Sig   fluticasone (FLONASE) 50 MCG/ACT nasal spray Place into both nostrils daily.   latanoprost (XALATAN) 0.005 % ophthalmic solution Apply to eye. Eye doctor   loratadine (CLARITIN) 10 MG tablet Take 10 mg by mouth daily.   meloxicam (MOBIC) 15 MG tablet Take 1 tablet (15 mg total) by mouth daily.   metoprolol succinate (TOPROL-XL) 50 MG 24 hr tablet TAKE 1 TABLET BY MOUTH ONCE DAILY. TAKE WITH OR IMMEDIATELY FOLLOWING MEAL   Multiple Vitamins-Minerals (MULTIVITAMIN ADULT PO) Take 1 tablet by mouth daily.    Omega-3 Fatty Acids (FISH OIL PO) Take 1 capsule by mouth daily.     PHQ 2/9 Scores 06/18/2021 04/02/2021 09/26/2020 09/04/2020  PHQ - 2 Score 0 0 0 0  PHQ- 9 Score 0 0 0 0    GAD 7 : Generalized Anxiety Score 06/18/2021 04/02/2021 09/26/2020 09/04/2020  Nervous, Anxious, on Edge 0 0 0 0  Control/stop worrying 0 0 0 0  Worry too much - different things 0 0 0 0  Trouble relaxing 0 0 0 0  Restless 0 0 0 0  Easily annoyed or irritable 0 0 0 0   Afraid - awful might happen 0 0 0 0  Total GAD 7 Score 0 0 0 0  Anxiety Difficulty Not difficult at all - Not difficult at all -    BP Readings from Last 3 Encounters:  06/18/21 120/80  04/02/21 128/80  09/26/20 138/82    Physical Exam Vitals and nursing note reviewed.  HENT:     Head: Normocephalic.     Right Ear: Tympanic membrane, ear canal and external ear normal.     Left Ear: Tympanic membrane, ear canal and external ear normal.     Nose: Nose normal. No congestion or rhinorrhea.     Mouth/Throat:     Mouth: Mucous membranes are moist.  Eyes:     General: No scleral icterus.       Right eye: No discharge.  Left eye: No discharge.     Conjunctiva/sclera: Conjunctivae normal.     Pupils: Pupils are equal, round, and reactive to light.  Neck:     Thyroid: No thyromegaly.     Vascular: No JVD.     Trachea: No tracheal deviation.  Cardiovascular:     Rate and Rhythm: Normal rate and regular rhythm.     Heart sounds: Normal heart sounds. No murmur heard.   No friction rub. No gallop.  Pulmonary:     Effort: No respiratory distress.     Breath sounds: Normal breath sounds. No wheezing, rhonchi or rales.  Abdominal:     General: Bowel sounds are normal.     Palpations: Abdomen is soft. There is no mass.     Tenderness: There is no abdominal tenderness. There is no guarding or rebound.  Musculoskeletal:        General: No tenderness. Normal range of motion.     Cervical back: Normal range of motion and neck supple.  Lymphadenopathy:     Cervical: No cervical adenopathy.  Skin:    General: Skin is warm.     Findings: No rash.  Neurological:     Mental Status: He is alert and oriented to person, place, and time.     Cranial Nerves: No cranial nerve deficit.     Deep Tendon Reflexes: Reflexes are normal and symmetric.    Wt Readings from Last 3 Encounters:  06/18/21 183 lb (83 kg)  04/02/21 177 lb (80.3 kg)  09/26/20 172 lb (78 kg)    BP 120/80    Pulse  60    Ht 5' 8" (1.727 m)    Wt 183 lb (83 kg)    BMI 27.83 kg/m   Assessment and Plan:  1. Essential hypertension Chronic.  Controlled.  Stable.  Blood pressure today is 120/80.  Continue metoprolol XL 50 mg once a day.  Review of previous labs acceptable we will recheck in 39-month- metoprolol succinate (TOPROL-XL) 50 MG 24 hr tablet; TAKE 1 TABLET BY MOUTH ONCE DAILY. TAKE WITH OR IMMEDIATELY FOLLOWING MEAL  Dispense: 90 tablet; Refill: 1  2. Essential tremor Chronic.  Controlled.  Stable.  This is also controlled with metoprolol XL 50 mg. - metoprolol succinate (TOPROL-XL) 50 MG 24 hr tablet; TAKE 1 TABLET BY MOUTH ONCE DAILY. TAKE WITH OR IMMEDIATELY FOLLOWING MEAL  Dispense: 90 tablet; Refill: 1  3. Acute maxillary sinusitis, recurrence not specified New onset.  Persistent.  Stable.  Patient had symptoms and exam consistent with an acute maxillary sinusitis.  We will treat with Z-Pak to 50 mg 2 today followed by 1 a day for 4 days. - azithromycin (ZITHROMAX) 250 MG tablet; Take 2 tablets on day 1, then 1 tablet daily on days 2 through 5  Dispense: 6 tablet; Refill: 0

## 2021-08-13 DIAGNOSIS — H35372 Puckering of macula, left eye: Secondary | ICD-10-CM | POA: Diagnosis not present

## 2021-08-13 DIAGNOSIS — H40023 Open angle with borderline findings, high risk, bilateral: Secondary | ICD-10-CM | POA: Diagnosis not present

## 2021-08-13 DIAGNOSIS — H43822 Vitreomacular adhesion, left eye: Secondary | ICD-10-CM | POA: Diagnosis not present

## 2021-08-13 DIAGNOSIS — Z0100 Encounter for examination of eyes and vision without abnormal findings: Secondary | ICD-10-CM | POA: Diagnosis not present

## 2021-08-15 ENCOUNTER — Other Ambulatory Visit: Payer: Self-pay | Admitting: Family Medicine

## 2021-08-15 DIAGNOSIS — M25559 Pain in unspecified hip: Secondary | ICD-10-CM

## 2021-09-02 ENCOUNTER — Ambulatory Visit: Payer: Medicare HMO

## 2021-09-02 IMAGING — CR DG SINUSES COMPLETE 3+V
4 series · 5 of 5 positions shown · non-contrast
Comparison: None.

CLINICAL DATA: Recurrent sinus infection

EXAM:
PARANASAL SINUSES - COMPLETE 3 + VIEW

[sinuses waters]
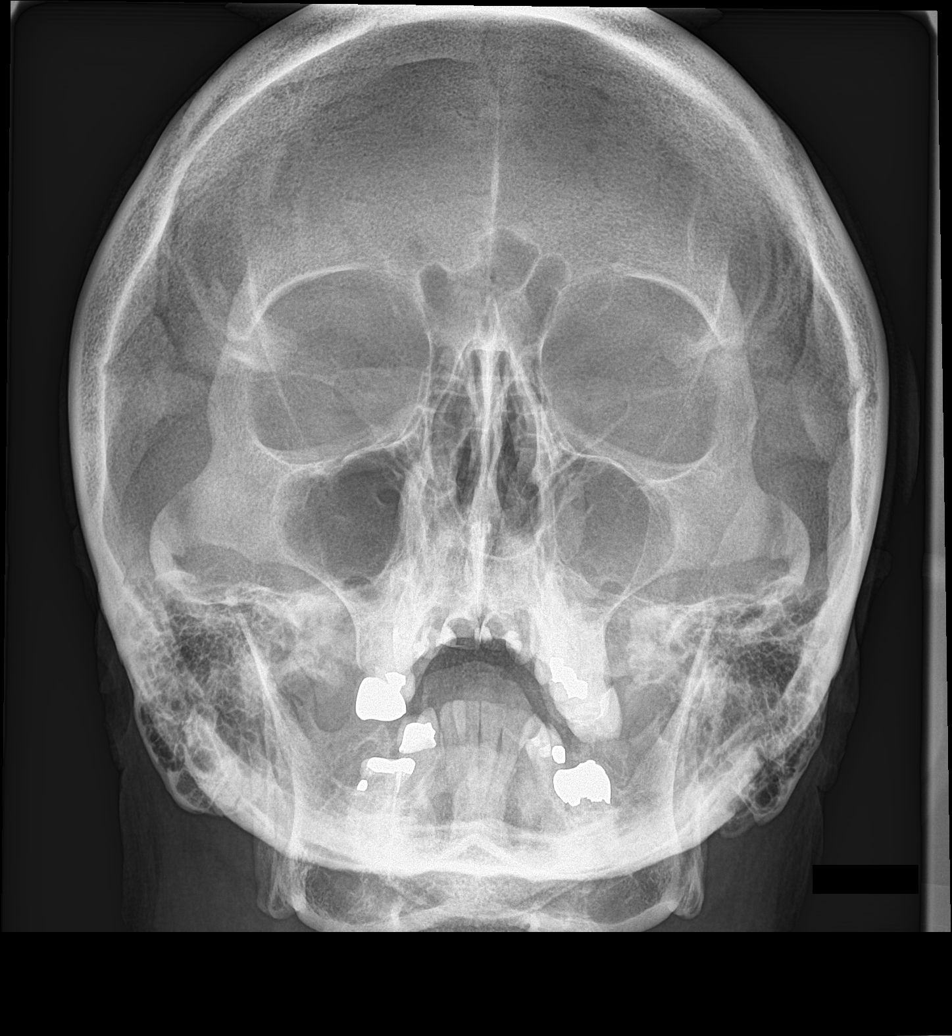

[sinuses pa]
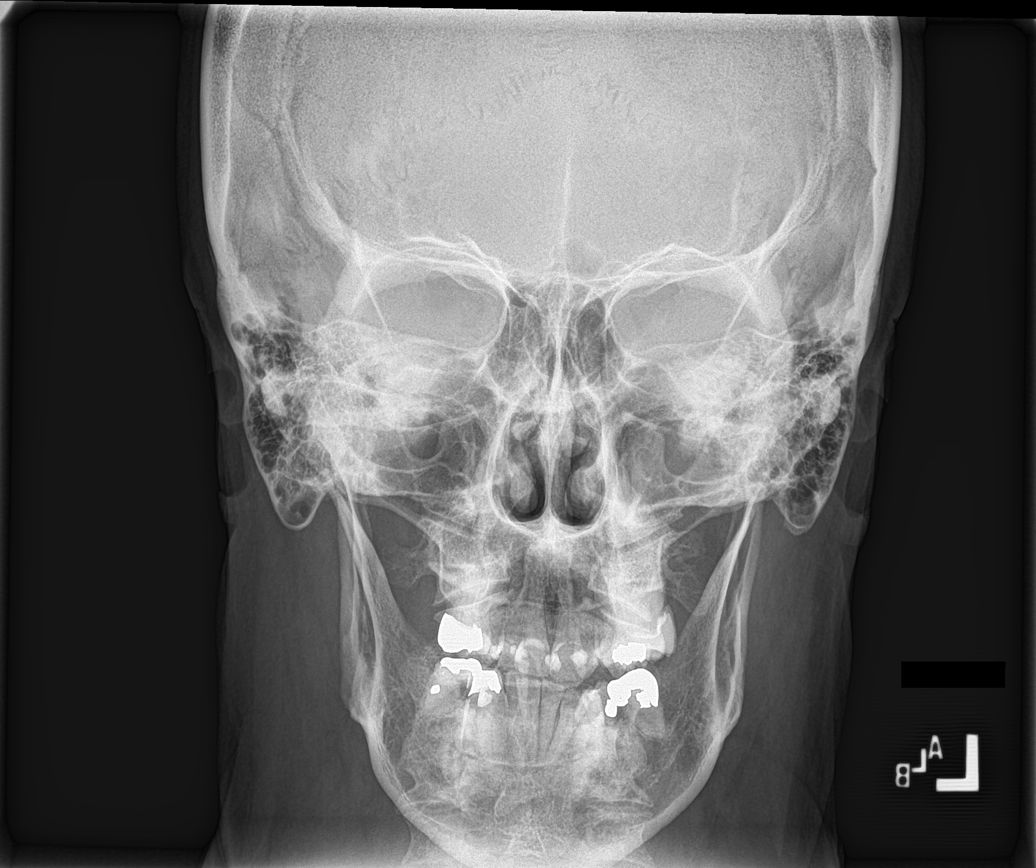

[sinuses lat]
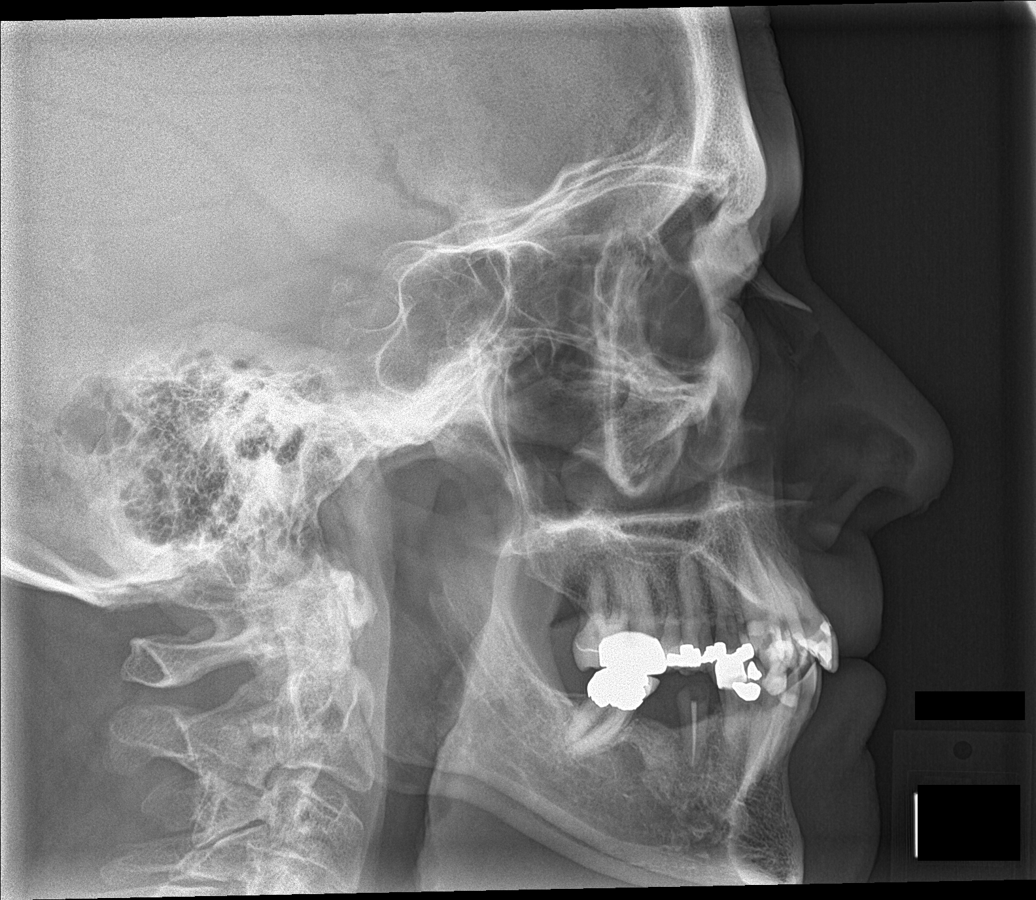

[Series 4: skull smv · 0.14mm/px · 2 of 2 slices shown]
[im 1/2]
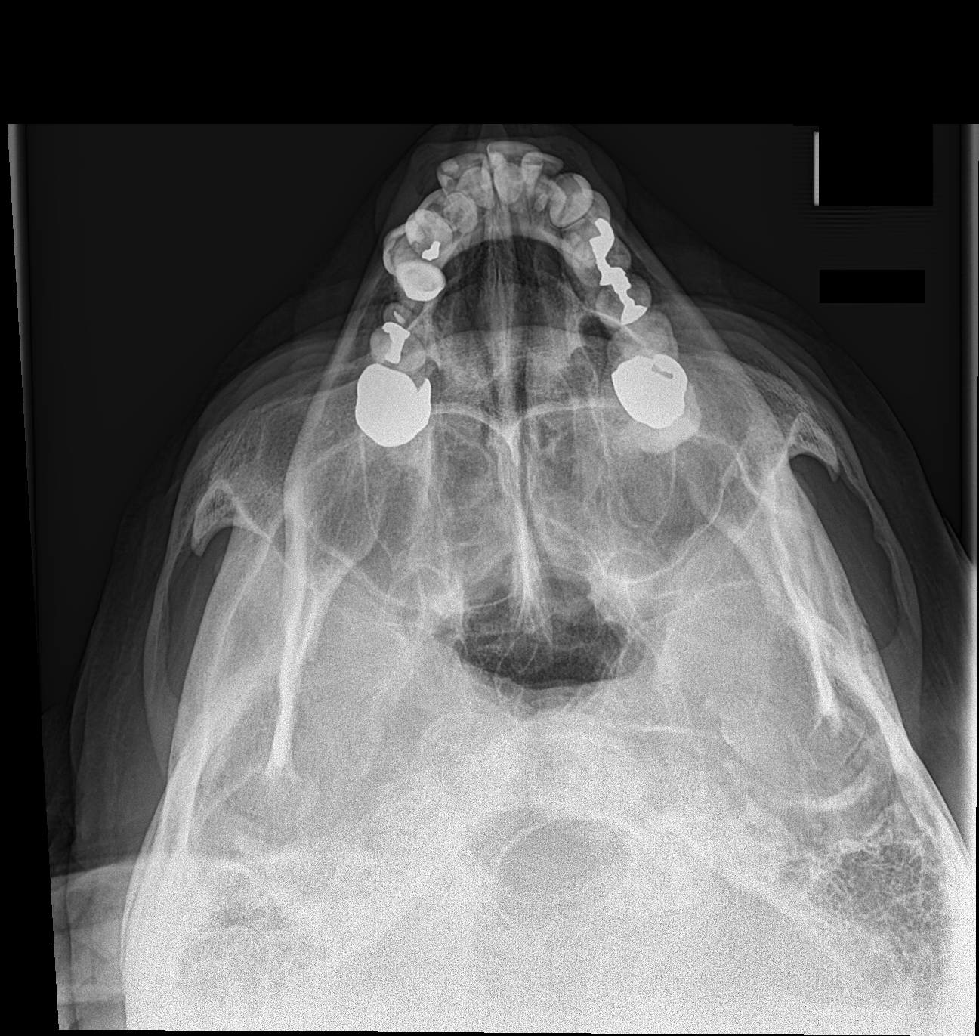
[im 2/2]
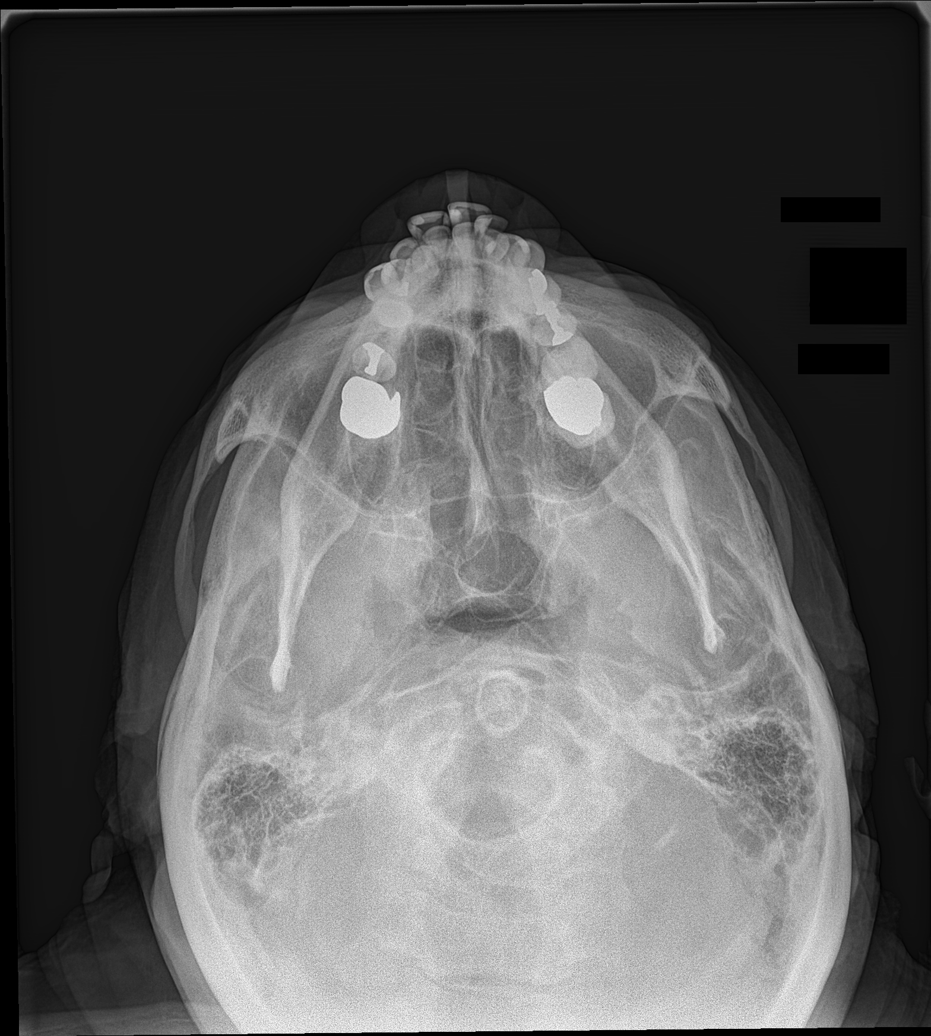

[5 of 5 positions shown; findings below may reference images not displayed]

FINDINGS: The paranasal sinus are aerated. There is no evidence of sinus
opacification air-fluid levels or mucosal thickening. No significant
bone abnormalities are seen.
IMPRESSION: No acute abnormality noted.

## 2021-09-06 ENCOUNTER — Ambulatory Visit (INDEPENDENT_AMBULATORY_CARE_PROVIDER_SITE_OTHER): Payer: Medicare HMO | Admitting: Family Medicine

## 2021-09-06 ENCOUNTER — Encounter: Payer: Self-pay | Admitting: Family Medicine

## 2021-09-06 VITALS — BP 126/84 | HR 63 | Ht 68.0 in | Wt 179.0 lb

## 2021-09-06 DIAGNOSIS — Z0000 Encounter for general adult medical examination without abnormal findings: Secondary | ICD-10-CM | POA: Diagnosis not present

## 2021-09-06 DIAGNOSIS — E7800 Pure hypercholesterolemia, unspecified: Secondary | ICD-10-CM | POA: Diagnosis not present

## 2021-09-06 DIAGNOSIS — Z125 Encounter for screening for malignant neoplasm of prostate: Secondary | ICD-10-CM | POA: Diagnosis not present

## 2021-09-06 LAB — HEMOCCULT GUIAC POC 1CARD (OFFICE): Fecal Occult Blood, POC: NEGATIVE

## 2021-09-06 NOTE — Progress Notes (Signed)
? ? ?Date:  09/06/2021  ? ?Name:  Derek Figueroa   DOB:  [birth year]   MRN:  [id number] ? ? ?Chief Complaint: Annual Exam ? ?Derek Figueroa is a 71 y.o. male who presents today for his Complete Annual Exam. He feels well. He reports exercising yardwork. He reports he is sleeping well.  Patient is a 71 year old male who presents for a comprehensive physical exam. The patient reports the following problems: none. Health maintenance has been reviewed up to date ?  ? ? ?Lab Results  ?Component Value Date  ? NA 143 04/03/2021  ? K 4.8 04/03/2021  ? CO2 24 04/03/2021  ? GLUCOSE 102 (H) 04/03/2021  ? BUN 16 04/03/2021  ? CREATININE 0.86 04/03/2021  ? CALCIUM 9.3 04/03/2021  ? EGFR 94 04/03/2021  ? GFRNONAA 88 08/30/2019  ? ?Lab Results  ?Component Value Date  ? CHOL 192 09/27/2020  ? HDL 51 09/27/2020  ? LDLCALC 124 (H) 09/27/2020  ? TRIG 93 09/27/2020  ? CHOLHDL 3.8 08/20/2018  ? ?No results found for: TSH ?No results found for: HGBA1C ?Lab Results  ?Component Value Date  ? WBC 5.3 08/05/2017  ? HGB 15.9 08/05/2017  ? HCT 46.5 08/05/2017  ? MCV 84.3 08/05/2017  ? PLT 234 08/05/2017  ? ?Lab Results  ?Component Value Date  ? ALT 19 09/04/2020  ? AST 19 09/04/2020  ? ALKPHOS 58 09/04/2020  ? BILITOT 0.5 09/04/2020  ? ?No results found for: 25OHVITD2, [place], VD25OH  ? ?Review of Systems  ?Constitutional:  Negative for chills and fever.  ?HENT:  Negative for drooling, ear discharge, ear pain and sore throat.   ?Respiratory:  Negative for cough, shortness of breath and wheezing.   ?Cardiovascular:  Negative for chest pain, palpitations and leg swelling.  ?Gastrointestinal:  Negative for abdominal pain, blood in stool, constipation, diarrhea and nausea.  ?Endocrine: Negative for polydipsia.  ?Genitourinary:  Negative for dysuria, frequency, hematuria and urgency.  ?Musculoskeletal:  Negative for back pain, myalgias and neck pain.  ?Skin:  Negative for rash.  ?Allergic/Immunologic: Negative for environmental allergies.   ?Neurological:  Negative for dizziness and headaches.  ?Hematological:  Does not bruise/bleed easily.  ?Psychiatric/Behavioral:  Negative for suicidal ideas. The patient is not nervous/anxious.   ? ?Patient Active Problem List  ? Diagnosis Date Noted  ? Personal history of colonic polyps   ? Polyp of transverse colon   ? Cholecystitis, acute   ? Obstruction of bile duct   ? Jaundice   ? Abnormal findings on imaging of biliary tract   ? Residual hemorrhoidal skin tags 06/23/2014  ? Right shoulder pain 01/05/2014  ? Gastroesophageal reflux disease without esophagitis 01/05/2014  ? Elevated blood pressure 01/05/2014  ? History of adenomatous polyp of colon 05/26/2006  ? ? ?No Known Allergies ? ?Past Surgical History:  ?Procedure Laterality Date  ? COLONOSCOPY    ? COLONOSCOPY WITH PROPOFOL N/A 10/11/2019  ? Procedure: COLONOSCOPY WITH PROPOFOL;  Surgeon: Lucilla Lame, MD;  Location: [facility] ENDOSCOPY;  Service: Endoscopy;  Laterality: N/A;  ? ERCP N/A 08/13/2017  ? Procedure: ENDOSCOPIC RETROGRADE CHOLANGIOPANCREATOGRAPHY (ERCP);  Surgeon: Lucilla Lame, MD;  Location: [facility] ENDOSCOPY;  Service: Endoscopy;  Laterality: N/A;  ? EYE SURGERY    ? gall stones    ? HERNIA REPAIR    ? ROBOTIC ASSISTED LAPAROSCOPIC CHOLECYSTECTOMY-MULTI SITE N/A 10/20/2017  ? Procedure: ROBOTIC ASSISTED LAPAROSCOPIC CHOLECYSTECTOMY-MULTI SITE;  Surgeon: Jules Husbands, MD;  Location: [facility] ORS;  Service: General;  Laterality: N/A;  ? UMBILICAL HERNIA REPAIR N/A 10/20/2017  ? Procedure: HERNIA REPAIR UMBILICAL ADULT;  Surgeon: Jules Husbands, MD;  Location: [facility] ORS;  Service: General;  Laterality: N/A;  ? ? ?Social History  ? ?Tobacco Use  ? Smoking status: Former  ?  Types: Cigarettes  ?  Quit date: 09/02/1978  ?  Years since quitting: 43.0  ? Smokeless tobacco: Never  ? Tobacco comments:  ?  quit 40 yrs  ?Vaping Use  ? Vaping Use: Never used  ?Substance Use Topics  ? Alcohol use: Yes  ?  Alcohol/week: 3.0 standard drinks  ?  Types: 3 Glasses of wine  per week  ?  Comment: occasionally  ? Drug use: No  ? ? ? ?Medication list has been reviewed and updated. ? ?Current Meds  ?Medication Sig  ? fluticasone (FLONASE) 50 MCG/ACT nasal spray Place into both nostrils daily.  ? latanoprost (XALATAN) 0.005 % ophthalmic solution Apply to eye. Eye doctor  ? loratadine (CLARITIN) 10 MG tablet Take 10 mg by mouth daily.  ? meloxicam (MOBIC) 15 MG tablet Take 1 tablet by mouth once daily  ? metoprolol succinate (TOPROL-XL) 50 MG 24 hr tablet TAKE 1 TABLET BY MOUTH ONCE DAILY. TAKE WITH OR IMMEDIATELY FOLLOWING MEAL  ? Multiple Vitamins-Minerals (MULTIVITAMIN ADULT PO) Take 1 tablet by mouth daily.   ? Omega-3 Fatty Acids (FISH OIL PO) Take 1 capsule by mouth daily.   ? ? ? ?  09/06/2021  ?  9:49 AM 06/18/2021  ?  9:50 AM 04/02/2021  ?  8:52 AM 09/26/2020  ? 10:21 AM  ?GAD 7 : Generalized Anxiety Score  ?Nervous, Anxious, on Edge 0 0 0 0  ?Control/stop worrying 0 0 0 0  ?Worry too much - different things 0 0 0 0  ?Trouble relaxing 0 0 0 0  ?Restless 0 0 0 0  ?Easily annoyed or irritable 0 0 0 0  ?Afraid - awful might happen 0 0 0 0  ?Total GAD 7 Score 0 0 0 0  ?Anxiety Difficulty Not difficult at all Not difficult at all  Not difficult at all  ? ? ? ?  09/06/2021  ?  9:49 AM  ?Depression screen PHQ 2/9  ?Decreased Interest 0  ?Down, Depressed, Hopeless 0  ?PHQ - 2 Score 0  ?Altered sleeping 0  ?Tired, decreased energy 0  ?Change in appetite 0  ?Feeling bad or failure about yourself  0  ?Trouble concentrating 0  ?Moving slowly or fidgety/restless 0  ?Suicidal thoughts 0  ?PHQ-9 Score 0  ?Difficult doing work/chores Not difficult at all  ? ? ?BP Readings from Last 3 Encounters:  ?09/06/21 126/84  ?06/18/21 120/80  ?04/02/21 128/80  ? ? ?Physical Exam ?Vitals and nursing note reviewed.  ?Constitutional:   ?   Appearance: Normal appearance. He is well-developed and well-groomed.  ?HENT:  ?   Head: Normocephalic.  ?   Jaw: There is normal jaw occlusion.  ?   Right Ear: Hearing, tympanic  membrane, ear canal and external ear normal. There is no impacted cerumen.  ?   Left Ear: Hearing, tympanic membrane, ear canal and external ear normal. There is no impacted cerumen.  ?   Nose: Nose normal. No congestion or rhinorrhea.  ?   Right Turbinates: Not enlarged, swollen or pale.  ?   Left Turbinates: Not enlarged, swollen or pale.  ?   Mouth/Throat:  ?   Lips: Pink.  ?   Mouth: Mucous  membranes are moist. Mucous membranes are pale.  ?   Tongue: No lesions. Tongue does not deviate from midline.  ?   Palate: No mass and lesions.  ?   Pharynx: Oropharynx is clear. Uvula midline. No pharyngeal swelling, oropharyngeal exudate, posterior oropharyngeal erythema or uvula swelling.  ?   Tonsils: No tonsillar exudate or tonsillar abscesses.  ?Eyes:  ?   General: Lids are normal. Vision grossly intact. Gaze aligned appropriately. No scleral icterus.    ?   Right eye: No discharge.     ?   Left eye: No discharge.  ?   Extraocular Movements: Extraocular movements intact.  ?   Conjunctiva/sclera: Conjunctivae normal.  ?   Pupils: Pupils are equal, round, and reactive to light.  ?Neck:  ?   Thyroid: No thyroid mass, thyromegaly or thyroid tenderness.  ?   Vascular: Normal carotid pulses. No carotid bruit, hepatojugular reflux or JVD.  ?   Trachea: Trachea and phonation normal.  ?Cardiovascular:  ?   Rate and Rhythm: Normal rate and regular rhythm.  ?   Pulses: Normal pulses.     ?     Carotid pulses are 2+ on the right side and 2+ on the left side. ?     Radial pulses are 2+ on the right side and 2+ on the left side.  ?     Femoral pulses are 2+ on the right side and 2+ on the left side. ?     Popliteal pulses are 2+ on the right side and 2+ on the left side.  ?     Dorsalis pedis pulses are 2+ on the right side and 2+ on the left side.  ?     Posterior tibial pulses are 2+ on the right side and 2+ on the left side.  ?   Heart sounds: Normal heart sounds, S1 normal and S2 normal. No murmur heard. ?No systolic murmur is  present.  ?No diastolic murmur is present.  ?  No friction rub. No gallop. No S3 or S4 sounds.  ?Pulmonary:  ?   Effort: Pulmonary effort is normal. No respiratory distress.  ?   Breath sounds: Normal br

## 2021-09-07 LAB — CBC
Hematocrit: 45.3 % (ref 37.5–51.0)
Hemoglobin: 15.1 g/dL (ref 13.0–17.7)
MCH: 29 pg (ref 26.6–33.0)
MCHC: 33.3 g/dL (ref 31.5–35.7)
MCV: 87 fL (ref 79–97)
Platelets: 253 10*3/uL (ref 150–450)
RBC: 5.21 x10E6/uL (ref 4.14–5.80)
RDW: 12.9 % (ref 11.6–15.4)
WBC: 6.9 10*3/uL (ref 3.4–10.8)

## 2021-09-07 LAB — LIPID PANEL WITH LDL/HDL RATIO
Cholesterol, Total: 203 mg/dL — ABNORMAL HIGH (ref 100–199)
HDL: 49 mg/dL (ref >39)
LDL Chol Calc (NIH): 132 mg/dL — ABNORMAL HIGH (ref 0–99)
LDL/HDL Ratio: 2.7 ratio (ref 0.0–3.6)
Triglycerides: 121 mg/dL (ref 0–149)
VLDL Cholesterol Cal: 22 mg/dL (ref 5–40)

## 2021-09-07 LAB — PSA: Prostate Specific Ag, Serum: 2.3 ng/mL (ref 0.0–4.0)

## 2021-09-09 ENCOUNTER — Ambulatory Visit: Payer: Medicare HMO

## 2021-10-01 ENCOUNTER — Encounter: Payer: Self-pay | Admitting: Family Medicine

## 2021-10-01 ENCOUNTER — Ambulatory Visit (INDEPENDENT_AMBULATORY_CARE_PROVIDER_SITE_OTHER): Payer: Medicare HMO | Admitting: Family Medicine

## 2021-10-01 VITALS — BP 138/82 | HR 72 | Ht 68.0 in | Wt 182.0 lb

## 2021-10-01 DIAGNOSIS — I10 Essential (primary) hypertension: Secondary | ICD-10-CM

## 2021-10-01 DIAGNOSIS — G250 Essential tremor: Secondary | ICD-10-CM

## 2021-10-01 MED ORDER — METOPROLOL SUCCINATE ER 50 MG PO TB24: ORAL_TABLET | ORAL | 1 refills | Status: DC

## 2021-10-01 NOTE — Progress Notes (Signed)
? ? ?Date:  10/01/2021  ? ?Name:  Derek Figueroa   DOB:  09-23-1950   MRN:  [id number] ? ? ?Chief Complaint: Hypertension ? ?Hypertension ?This is a chronic problem. The current episode started more than 1 year ago. The problem has been gradually improving since onset. The problem is controlled. Pertinent negatives include no anxiety, blurred vision, chest pain, headaches, neck pain, orthopnea, palpitations, PND or shortness of breath. There are no associated agents to hypertension. Risk factors for coronary artery disease include dyslipidemia. Past treatments include beta blockers. The current treatment provides moderate improvement. There are no compliance problems.  There is no history of angina, kidney disease, CAD/MI, CVA, heart failure, left ventricular hypertrophy, PVD or retinopathy. There is no history of chronic renal disease, a hypertension causing med or renovascular disease.  ? ?Lab Results  ?Component Value Date  ? NA 143 04/03/2021  ? K 4.8 04/03/2021  ? CO2 24 04/03/2021  ? GLUCOSE 102 (H) 04/03/2021  ? BUN 16 04/03/2021  ? CREATININE 0.86 04/03/2021  ? CALCIUM 9.3 04/03/2021  ? EGFR 94 04/03/2021  ? GFRNONAA 88 08/30/2019  ? ?Lab Results  ?Component Value Date  ? CHOL 203 (H) 09/06/2021  ? HDL 49 09/06/2021  ? LDLCALC 132 (H) 09/06/2021  ? TRIG 121 09/06/2021  ? CHOLHDL 3.8 08/20/2018  ? ?No results found for: TSH ?No results found for: HGBA1C ?Lab Results  ?Component Value Date  ? WBC 6.9 09/06/2021  ? HGB 15.1 09/06/2021  ? HCT 45.3 09/06/2021  ? MCV 87 09/06/2021  ? PLT 253 09/06/2021  ? ?Lab Results  ?Component Value Date  ? ALT 19 09/04/2020  ? AST 19 09/04/2020  ? ALKPHOS 58 09/04/2020  ? BILITOT 0.5 09/04/2020  ? ?No results found for: 25OHVITD2, [place], VD25OH  ? ?Review of Systems  ?Constitutional:  Negative for chills and fever.  ?HENT:  Negative for drooling, ear discharge, ear pain and sore throat.   ?Eyes:  Negative for blurred vision.  ?Respiratory:  Negative for cough, shortness  of breath and wheezing.   ?Cardiovascular:  Negative for chest pain, palpitations, orthopnea, leg swelling and PND.  ?Gastrointestinal:  Negative for abdominal pain, blood in stool, constipation, diarrhea and nausea.  ?Endocrine: Negative for polydipsia.  ?Genitourinary:  Negative for dysuria, frequency, hematuria and urgency.  ?Musculoskeletal:  Negative for back pain, myalgias and neck pain.  ?Skin:  Negative for rash.  ?Allergic/Immunologic: Negative for environmental allergies.  ?Neurological:  Negative for dizziness and headaches.  ?Hematological:  Does not bruise/bleed easily.  ?Psychiatric/Behavioral:  Negative for suicidal ideas. The patient is not nervous/anxious.   ? ?Patient Active Problem List  ? Diagnosis Date Noted  ? Personal history of colonic polyps   ? Polyp of transverse colon   ? Cholecystitis, acute   ? Obstruction of bile duct   ? Jaundice   ? Abnormal findings on imaging of biliary tract   ? Residual hemorrhoidal skin tags 06/23/2014  ? Right shoulder pain 01/05/2014  ? Gastroesophageal reflux disease without esophagitis 01/05/2014  ? Elevated blood pressure 01/05/2014  ? History of adenomatous polyp of colon 05/26/2006  ? ? ?No Known Allergies ? ?Past Surgical History:  ?Procedure Laterality Date  ? COLONOSCOPY    ? COLONOSCOPY WITH PROPOFOL N/A 10/11/2019  ? Procedure: COLONOSCOPY WITH PROPOFOL;  Surgeon: Lucilla Lame, MD;  Location: [facility] ENDOSCOPY;  Service: Endoscopy;  Laterality: N/A;  ? ERCP N/A 08/13/2017  ? Procedure: ENDOSCOPIC RETROGRADE CHOLANGIOPANCREATOGRAPHY (ERCP);  Surgeon: Lucilla Lame, MD;  Location: [facility] ENDOSCOPY;  Service: Endoscopy;  Laterality: N/A;  ? EYE SURGERY    ? gall stones    ? HERNIA REPAIR    ? ROBOTIC ASSISTED LAPAROSCOPIC CHOLECYSTECTOMY-MULTI SITE N/A 10/20/2017  ? Procedure: ROBOTIC ASSISTED LAPAROSCOPIC CHOLECYSTECTOMY-MULTI SITE;  Surgeon: Jules Husbands, MD;  Location: [facility] ORS;  Service: General;  Laterality: N/A;  ? UMBILICAL HERNIA REPAIR N/A 10/20/2017   ? Procedure: HERNIA REPAIR UMBILICAL ADULT;  Surgeon: Jules Husbands, MD;  Location: [facility] ORS;  Service: General;  Laterality: N/A;  ? ? ?Social History  ? ?Tobacco Use  ? Smoking status: Former  ?  Types: Cigarettes  ?  Quit date: 09/02/1978  ?  Years since quitting: 43.1  ? Smokeless tobacco: Never  ? Tobacco comments:  ?  quit 40 yrs  ?Vaping Use  ? Vaping Use: Never used  ?Substance Use Topics  ? Alcohol use: Yes  ?  Alcohol/week: 3.0 standard drinks  ?  Types: 3 Glasses of wine per week  ?  Comment: occasionally  ? Drug use: No  ? ? ? ?Medication list has been reviewed and updated. ? ?Current Meds  ?Medication Sig  ? latanoprost (XALATAN) 0.005 % ophthalmic solution Apply to eye. Eye doctor  ? meloxicam (MOBIC) 15 MG tablet Take 1 tablet by mouth once daily  ? metoprolol succinate (TOPROL-XL) 50 MG 24 hr tablet TAKE 1 TABLET BY MOUTH ONCE DAILY. TAKE WITH OR IMMEDIATELY FOLLOWING MEAL  ? Multiple Vitamins-Minerals (MULTIVITAMIN ADULT PO) Take 1 tablet by mouth daily.   ? Omega-3 Fatty Acids (FISH OIL PO) Take 1 capsule by mouth daily.   ? ? ? ?  10/01/2021  ?  9:31 AM 09/06/2021  ?  9:49 AM 06/18/2021  ?  9:50 AM 04/02/2021  ?  8:52 AM  ?GAD 7 : Generalized Anxiety Score  ?Nervous, Anxious, on Edge 0 0 0 0  ?Control/stop worrying 0 0 0 0  ?Worry too much - different things 0 0 0 0  ?Trouble relaxing 0 0 0 0  ?Restless 0 0 0 0  ?Easily annoyed or irritable 0 0 0 0  ?Afraid - awful might happen 0 0 0 0  ?Total GAD 7 Score 0 0 0 0  ?Anxiety Difficulty Not difficult at all Not difficult at all Not difficult at all   ? ? ? ?  10/01/2021  ?  9:31 AM  ?Depression screen PHQ 2/9  ?Decreased Interest 0  ?Down, Depressed, Hopeless 0  ?PHQ - 2 Score 0  ?Altered sleeping 0  ?Tired, decreased energy 0  ?Change in appetite 0  ?Feeling bad or failure about yourself  0  ?Trouble concentrating 0  ?Moving slowly or fidgety/restless 0  ?Suicidal thoughts 0  ?PHQ-9 Score 0  ?Difficult doing work/chores Not difficult at all  ? ? ?BP  Readings from Last 3 Encounters:  ?10/01/21 138/82  ?09/06/21 126/84  ?06/18/21 120/80  ? ? ?Physical Exam ?Vitals and nursing note reviewed.  ?HENT:  ?   Head: Normocephalic.  ?   Right Ear: Tympanic membrane, ear canal and external ear normal.  ?   Left Ear: Tympanic membrane, ear canal and external ear normal.  ?   Nose: Nose normal. No congestion or rhinorrhea.  ?Eyes:  ?   General: No scleral icterus.    ?   Right eye: No discharge.     ?   Left eye: No discharge.  ?   Conjunctiva/sclera: Conjunctivae normal.  ?   Pupils:  Pupils are equal, round, and reactive to light.  ?Neck:  ?   Thyroid: No thyromegaly.  ?   Vascular: No JVD.  ?   Trachea: No tracheal deviation.  ?Cardiovascular:  ?   Rate and Rhythm: Normal rate and regular rhythm.  ?   Heart sounds: Normal heart sounds. No murmur heard. ?  No friction rub. No gallop.  ?Pulmonary:  ?   Effort: No respiratory distress.  ?   Breath sounds: Normal breath sounds. No wheezing, rhonchi or rales.  ?Abdominal:  ?   General: Bowel sounds are normal.  ?   Palpations: Abdomen is soft. There is no mass.  ?   Tenderness: There is no abdominal tenderness. There is no guarding or rebound.  ?Musculoskeletal:     ?   General: No tenderness. Normal range of motion.  ?   Cervical back: Normal range of motion and neck supple.  ?Lymphadenopathy:  ?   Cervical: No cervical adenopathy.  ?Skin: ?   General: Skin is warm.  ?   Findings: No bruising, erythema or rash.  ?Neurological:  ?   Mental Status: He is alert and oriented to person, place, and time.  ?   Cranial Nerves: No cranial nerve deficit.  ?   Deep Tendon Reflexes: Reflexes are normal and symmetric.  ? ? ?Wt Readings from Last 3 Encounters:  ?10/01/21 182 lb (82.6 kg)  ?09/06/21 179 lb (81.2 kg)  ?06/18/21 183 lb (83 kg)  ? ? ?BP 138/82   Pulse 72   Ht 5' 8"  (1.727 m)   Wt 182 lb (82.6 kg)   BMI 27.67 kg/m?  ? ?Assessment and Plan: ? ?1. Essential hypertension ?Chronic.  Controlled.  Stable.  Continue metoprolol XL  50 mg once a day.  Recheck in 6 months. ?- metoprolol succinate (TOPROL-XL) 50 MG 24 hr tablet; TAKE 1 TABLET BY MOUTH ONCE DAILY. TAKE WITH OR IMMEDIATELY FOLLOWING MEAL  Dispense: 90 tablet; Refill: 1 ? ?2. Es

## 2021-10-03 ENCOUNTER — Other Ambulatory Visit: Payer: Self-pay

## 2021-10-03 DIAGNOSIS — H9190 Unspecified hearing loss, unspecified ear: Secondary | ICD-10-CM

## 2021-10-14 ENCOUNTER — Ambulatory Visit (INDEPENDENT_AMBULATORY_CARE_PROVIDER_SITE_OTHER): Payer: Medicare HMO

## 2021-10-14 DIAGNOSIS — Z0000 Encounter for general adult medical examination without abnormal findings: Secondary | ICD-10-CM

## 2021-10-14 NOTE — Progress Notes (Signed)
Subjective:   Derek Figueroa is a 71 y.o. male who presents for Medicare Annual/Subsequent preventive examination.  Virtual Visit via Telephone Note  I connected with  Derek Figueroa on 10/14/21 at  8:15 AM EDT by telephone and verified that I am speaking with the correct person using two identifiers.  Location: Patient: home Provider: [facility] Persons participating in the virtual visit: [place]   I discussed the limitations, risks, security and privacy concerns of performing an evaluation and management service by telephone and the availability of in person appointments. The patient expressed understanding and agreed to proceed.  Interactive audio and video telecommunications were attempted between this nurse and patient, however failed, due to patient having technical difficulties OR patient did not have access to video capability.  We continued and completed visit with audio only.  Some vital signs may be absent or patient reported.   Clemetine Marker, LPN   Review of Systems     Cardiac Risk Factors include: advanced age (>17mn, >>67women);male gender;hypertension     Objective:    There were no vitals filed for this visit. There is no height or weight on file to calculate BMI.     10/14/2021    8:32 AM 08/29/2020    9:38 AM 10/11/2019    7:45 AM 08/24/2019    9:39 AM 08/13/2017    9:36 AM 08/05/2017    9:14 AM 06/20/2017   12:53 PM  Advanced Directives  Does Patient Have a Medical Advance Directive? Yes Yes Yes No No No No  Type of AParamedicof [place]Living will H[place]side [place]Living will [place] will      Copy of [place] Chart? No - copy requested No - copy requested No - copy requested      Would patient like information on creating a medical advance directive?    No - Patient declined No - Patient declined No - Patient declined     Current Medications  (verified) Outpatient Encounter Medications as of 10/14/2021  Medication Sig   latanoprost (XALATAN) 0.005 % ophthalmic solution Apply to eye. Eye doctor   meloxicam (MOBIC) 15 MG tablet Take 1 tablet by mouth once daily   metoprolol succinate (TOPROL-XL) 50 MG 24 hr tablet TAKE 1 TABLET BY MOUTH ONCE DAILY. TAKE WITH OR IMMEDIATELY FOLLOWING MEAL   Multiple Vitamins-Minerals (MULTIVITAMIN ADULT PO) Take 1 tablet by mouth daily.    Omega-3 Fatty Acids (FISH OIL PO) Take 1 capsule by mouth daily.    [DISCONTINUED] fluticasone (FLONASE) 50 MCG/ACT nasal spray Place into both nostrils daily. (Patient not taking: Reported on 10/01/2021)   No facility-administered encounter medications on file as of 10/14/2021.    Allergies (verified) Patient has no known allergies.   History: Past Medical History:  Diagnosis Date   Heart murmur    Hypertension    Past Surgical History:  Procedure Laterality Date   COLONOSCOPY     COLONOSCOPY WITH PROPOFOL N/A 10/11/2019   Procedure: COLONOSCOPY WITH PROPOFOL;  Surgeon: WLucilla Lame MD;  [facility];  Service: Endoscopy;  Laterality: N/A;   ERCP N/A 08/13/2017   Procedure: ENDOSCOPIC RETROGRADE CHOLANGIOPANCREATOGRAPHY (ERCP);  Surgeon: WLucilla Lame MD;  [facility];  Service: Endoscopy;  Laterality: N/A;   EYE SURGERY     gall stones     HERNIA REPAIR     ROBOTIC ASSISTED LAPAROSCOPIC CHOLECYSTECTOMY-MULTI SITE N/A 10/20/2017   Procedure: ROBOTIC ASSISTED LAPAROSCOPIC  CHOLECYSTECTOMY-MULTI SITE;  Surgeon: Jules Husbands, MD;  Location: [facility] ORS;  Service: General;  Laterality: N/A;   UMBILICAL HERNIA REPAIR N/A 10/20/2017   Procedure: HERNIA REPAIR UMBILICAL ADULT;  Surgeon: Jules Husbands, MD;  Location: [facility] ORS;  Service: General;  Laterality: N/A;   Family History  Problem Relation Age of Onset   Heart disease Mother    Diabetes Mother    Stroke Mother    Cancer Mother    Heart disease Father    COPD Father    Stroke  Father    Lung cancer Brother    COPD Brother    Cancer Brother    Cancer Maternal Grandmother    Heart disease Maternal Grandmother    Stroke Maternal Grandmother    Cancer Maternal Grandfather    Diabetes Maternal Grandfather    Heart disease Maternal Grandfather    Social History   Socioeconomic History   Marital status: Married    Spouse name: Not on file   Number of children: 2   Years of education: Not on file   Highest education level: Not on file  Occupational History   Not on file  Tobacco Use   Smoking status: Former    Types: Cigarettes    Quit date: 09/02/1978    Years since quitting: 43.1   Smokeless tobacco: Never   Tobacco comments:    quit 40 yrs  Vaping Use   Vaping Use: Never used  Substance and Sexual Activity   Alcohol use: Yes    Alcohol/week: 3.0 standard drinks    Types: 3 Glasses of wine per week    Comment: occasionally   Drug use: No   Sexual activity: Yes  Other Topics Concern   Not on file  Social History Narrative   Not on file   Social Determinants of Health   Financial Resource Strain: Low Risk    Difficulty of Paying Living Expenses: Not hard at all  Food Insecurity: No Food Insecurity   Worried About Charity fundraiser in the Last Year: Never true   [place] in the Last Year: Never true  Transportation Needs: No Transportation Needs   Lack of Transportation (Medical): No   Lack of Transportation (Non-Medical): No  Physical Activity: Inactive   Days of Exercise per Week: 0 days   Minutes of Exercise per Session: 0 min  Stress: No Stress Concern Present   Feeling of Stress : Not at all  Social Connections: Moderately Integrated   Frequency of Communication with Friends and Family: More than three times a week   Frequency of Social Gatherings with Friends and Family: Once a week   Attends Religious Services: More than 4 times per year   Active Member of [employer] or Organizations: No   Attends Arts administrator: Never   Marital Status: Married    Tobacco Counseling Counseling given: Not Answered Tobacco comments: quit 40 yrs   Clinical Intake:  Pre-visit preparation completed: Yes  Pain : No/denies pain     Nutritional Risks: None Diabetes: No  How often do you need to have someone help you when you read instructions, pamphlets, or other written materials from your doctor or pharmacy?: 1 - Never    Interpreter Needed?: No  Information entered by :: Clemetine Marker LPN   Activities of Daily Living    10/14/2021    8:33 AM  In your present state of health, do you have any difficulty performing the  following activities:  Hearing? 1  Vision? 0  Difficulty concentrating or making decisions? 0  Walking or climbing stairs? 0  Dressing or bathing? 0  Doing errands, shopping? 0  Preparing Food and eating ? N  Using the Toilet? N  In the past six months, have you accidently leaked urine? N  Do you have problems with loss of bowel control? N  Managing your Medications? N  Managing your Finances? N  Housekeeping or managing your Housekeeping? N    Patient Care Team: Juline Patch, MD as PCP - General (Family Medicine)  Indicate any recent Medical Services you may have received from other than [facility] providers in the past year (date may be approximate).     Assessment:   This is a routine wellness examination for Emari.  Hearing/Vision screen Hearing Screening - Comments:: Pt wears hearing aids from [facility] Vision Screening - Comments:: Annual vision screenings done at [place] issues and exercise activities discussed: Current Exercise Habits: The patient does not participate in regular exercise at present, Exercise limited by: None identified   Goals Addressed   None    Depression Screen    10/14/2021    8:31 AM 10/01/2021    9:31 AM 09/06/2021    9:49 AM 06/18/2021    9:50 AM 04/02/2021    8:52 AM 09/26/2020   10:21 AM 09/04/2020    9:54 AM   PHQ 2/9 Scores  PHQ - 2 Score 0 0 0 0 0 0 0  PHQ- 9 Score  0 0 0 0 0 0    Fall Risk    10/14/2021    8:33 AM 04/02/2021    8:51 AM 09/26/2020   10:22 AM 08/29/2020    9:39 AM 04/05/2020    1:44 PM  Fall Risk   Falls in the past year? 0 0 0 0 0  Number falls in past yr: 0 0 0 0   Injury with Fall? 0 0 0 0   Risk for fall due to : No Fall Risks No Fall Risks  No Fall Risks   Follow up Falls prevention discussed Falls evaluation completed Falls evaluation completed Falls prevention discussed Falls evaluation completed    FALL RISK PREVENTION PERTAINING TO THE HOME:  Any stairs in or around the home? Yes  If so, are there any without handrails? No  Home free of loose throw rugs in walkways, pet beds, electrical cords, etc? Yes  Adequate lighting in your home to reduce risk of falls? Yes   ASSISTIVE DEVICES UTILIZED TO PREVENT FALLS:  Life alert? No  Use of a cane, walker or w/c? No  Grab bars in the bathroom? Yes  Shower chair or bench in shower? No  Elevated toilet seat or a handicapped toilet? Yes   TIMED UP AND GO:  Was the test performed? No . Telephonic visit.   Cognitive Function: Normal cognitive status assessed by direct observation by this Nurse Health Advisor. No abnormalities found.          08/29/2020    9:42 AM  6CIT Screen  What Year? 0 points  What month? 0 points  What time? 0 points  Count back from 20 0 points  Months in reverse 0 points  Repeat phrase 0 points  Total Score 0 points    Immunizations Immunization History  Administered Date(s) Administered   Hepatitis A, Adult 04/01/1997, 04/28/1998   Influenza Whole 02/24/1996, 03/27/1996, 03/31/1997, 04/01/1998, 04/27/1999, 03/28/2000, 04/11/2001, 04/11/2002, 06/03/2003  Influenza,inj,Quad PF,6+ Mos 03/16/2014   MMR 06/06/2000   Meningococcal Polysaccharide 04/28/1998   Moderna Sars-Covid-2 Vaccination 09/27/2019, 10/25/2019   OPV 06/26/1972, 06/28/1972   PPD Test 07/01/1998, 02/27/2001,  02/18/2002, 02/05/2003   Pneumococcal Conjugate-13 07/06/2018   Td 08/24/1992, 09/01/1992, 06/04/2002   Tdap 06/20/2013   Typhoid Inactivated 02/27/2001, 03/11/2003   Typhoid Parenteral 07/01/1998   Typhoid Parenteral, AKD ([place] [employer]) 07/25/1995, 07/27/1995   Yellow Fever 06/26/1993, 07/06/1993, 06/01/1999    TDAP status: Up to date  Flu Vaccine status: Due, Education has been provided regarding the importance of this vaccine. Advised may receive this vaccine at local pharmacy or Health Dept. Aware to provide a copy of the vaccination record if obtained from local pharmacy or Health Dept. Verbalized acceptance and understanding.  Pneumococcal vaccine status: Up to date  Covid-19 vaccine status: Completed vaccines  Qualifies for Shingles Vaccine? Yes   Zostavax completed No   Shingrix Completed?: No.    Education has been provided regarding the importance of this vaccine. Patient has been advised to call insurance company to determine out of pocket expense if they have not yet received this vaccine. Advised may also receive vaccine at local pharmacy or Health Dept. Verbalized acceptance and understanding.  Screening Tests Health Maintenance  Topic Date Due   Zoster Vaccines- Shingrix (1 of 2) 12/06/2021 (Originally 01/14/1970)   Pneumonia Vaccine 8+ Years old (2 - PPSV23 if available, else PCV20) 04/02/2022 (Originally 07/07/2019)   INFLUENZA VACCINE  12/24/2021   TETANUS/TDAP  06/21/2023   COLONOSCOPY (Pts 45-20yr Insurance coverage will need to be confirmed)  10/10/2024   Hepatitis C Screening  Completed   HPV VACCINES  Aged Out   COVID-19 Vaccine  Discontinued    Health Maintenance  There are no preventive care reminders to display for this patient.  Colorectal cancer screening: Type of screening: Colonoscopy. Completed 10/11/19. Repeat every 5 years  Lung Cancer Screening: (Low Dose CT Chest recommended if Age 69-80years, 30 pack-year currently smoking OR have quit w/in  15years.) does not qualify.   Additional Screening:  Hepatitis C Screening: does qualify; Completed 09/23/13  Vision Screening: Recommended annual ophthalmology exams for early detection of glaucoma and other disorders of the eye. Is the patient up to date with their annual eye exam?  Yes  Who is the provider or what is the name of the office in which the patient attends annual eye exams? Dr. WEllin Mayhew   Dental Screening: Recommended annual dental exams for proper oral hygiene  Community Resource Referral / Chronic Care Management: CRR required this visit?  No   CCM required this visit?  No      Plan:     I have personally reviewed and noted the following in the patient's chart:   Medical and social history Use of alcohol, tobacco or illicit drugs  Current medications and supplements including opioid prescriptions. Patient is not currently taking opioid prescriptions. Functional ability and status Nutritional status Physical activity Advanced directives List of other physicians Hospitalizations, surgeries, and ER visits in previous 12 months Vitals Screenings to include cognitive, depression, and falls Referrals and appointments  In addition, I have reviewed and discussed with patient certain preventive protocols, quality metrics, and best practice recommendations. A written personalized care plan for preventive services as well as general preventive health recommendations were provided to patient.     KClemetine Marker LPN   [id number]  Nurse Notes: none

## 2021-10-14 NOTE — Patient Instructions (Signed)
Mr. Derek Figueroa , Thank you for taking time to come for your Medicare Wellness Visit. I appreciate your ongoing commitment to your health goals. Please review the following plan we discussed and let me know if I can assist you in the future.   Screening recommendations/referrals: Colonoscopy: done 10/11/19. Repeat 09/2024 Recommended yearly ophthalmology/optometry visit for glaucoma screening and checkup Recommended yearly dental visit for hygiene and checkup  Vaccinations: Influenza vaccine: declined Pneumococcal vaccine: done 07/06/18 Tdap vaccine: done 06/20/13 Shingles vaccine: Shingrix discussed. Please contact your pharmacy for coverage information.  Covid-19: done 09/27/19 & 10/25/19  Advanced directives: Please bring a copy of your health care power of attorney and living will to the office at your convenience.   Conditions/risks identified: Keep up the great work!  Next appointment: Follow up in one year for your annual wellness visit.   Preventive Care 20 Years and Older, Male Preventive care refers to lifestyle choices and visits with your health care provider that can promote health and wellness. What does preventive care include? A yearly physical exam. This is also called an annual well check. Dental exams once or twice a year. Routine eye exams. Ask your health care provider how often you should have your eyes checked. Personal lifestyle choices, including: Daily care of your teeth and gums. Regular physical activity. Eating a healthy diet. Avoiding tobacco and drug use. Limiting alcohol use. Practicing safe sex. Taking low doses of aspirin every day. Taking vitamin and mineral supplements as recommended by your health care provider. What happens during an annual well check? The services and screenings done by your health care provider during your annual well check will depend on your age, overall health, lifestyle risk factors, and family history of disease. Counseling   Your health care provider may ask you questions about your: Alcohol use. Tobacco use. Drug use. Emotional well-being. Home and relationship well-being. Sexual activity. Eating habits. History of falls. Memory and ability to understand (cognition). Work and work Statistician. Screening  You may have the following tests or measurements: Height, weight, and BMI. Blood pressure. Lipid and cholesterol levels. These may be checked every 5 years, or more frequently if you are over 67 years old. Skin check. Lung cancer screening. You may have this screening every year starting at age 80 if you have a 30-pack-year history of smoking and currently smoke or have quit within the past 15 years. Fecal occult blood test (FOBT) of the stool. You may have this test every year starting at age 36. Flexible sigmoidoscopy or colonoscopy. You may have a sigmoidoscopy every 5 years or a colonoscopy every 10 years starting at age 69. Prostate cancer screening. Recommendations will vary depending on your family history and other risks. Hepatitis C blood test. Hepatitis B blood test. Sexually transmitted disease (STD) testing. Diabetes screening. This is done by checking your blood sugar (glucose) after you have not eaten for a while (fasting). You may have this done every 1-3 years. Abdominal aortic aneurysm (AAA) screening. You may need this if you are a current or former smoker. Osteoporosis. You may be screened starting at age 62 if you are at high risk. Talk with your health care provider about your test results, treatment options, and if necessary, the need for more tests. Vaccines  Your health care provider may recommend certain vaccines, such as: Influenza vaccine. This is recommended every year. Tetanus, diphtheria, and acellular pertussis (Tdap, Td) vaccine. You may need a Td booster every 10 years. Zoster vaccine. You may need  this after age 23. Pneumococcal 13-valent conjugate (PCV13) vaccine.  One dose is recommended after age 65. Pneumococcal polysaccharide (PPSV23) vaccine. One dose is recommended after age 32. Talk to your health care provider about which screenings and vaccines you need and how often you need them. This information is not intended to replace advice given to you by your health care provider. Make sure you discuss any questions you have with your health care provider. Document Released: 06/08/2015 Document Revised: 01/30/2016 Document Reviewed: 03/13/2015 Elsevier Interactive Patient Education  2017 [place] Prevention in the Home Falls can cause injuries. They can happen to people of all ages. There are many things you can do to make your home safe and to help prevent falls. What can I do on the outside of my home? Regularly fix the edges of walkways and driveways and fix any cracks. Remove anything that might make you trip as you walk through a door, such as a raised step or threshold. Trim any bushes or trees on the path to your home. Use bright outdoor lighting. Clear any walking paths of anything that might make someone trip, such as rocks or tools. Regularly check to see if handrails are loose or broken. Make sure that both sides of any steps have handrails. Any raised decks and porches should have guardrails on the edges. Have any leaves, snow, or ice cleared regularly. Use sand or salt on walking paths during winter. Clean up any spills in your garage right away. This includes oil or grease spills. What can I do in the bathroom? Use night lights. Install grab bars by the toilet and in the tub and shower. Do not use towel bars as grab bars. Use non-skid mats or decals in the tub or shower. If you need to sit down in the shower, use a plastic, non-slip stool. Keep the floor dry. Clean up any water that spills on the floor as soon as it happens. Remove soap buildup in the tub or shower regularly. Attach bath mats securely with double-sided  non-slip rug tape. Do not have throw rugs and other things on the floor that can make you trip. What can I do in the bedroom? Use night lights. Make sure that you have a light by your bed that is easy to reach. Do not use any sheets or blankets that are too big for your bed. They should not hang down onto the floor. Have a firm chair that has side arms. You can use this for support while you get dressed. Do not have throw rugs and other things on the floor that can make you trip. What can I do in the kitchen? Clean up any spills right away. Avoid walking on wet floors. Keep items that you use a lot in easy-to-reach places. If you need to reach something above you, use a strong step stool that has a grab bar. Keep electrical cords out of the way. Do not use floor polish or wax that makes floors slippery. If you must use wax, use non-skid floor wax. Do not have throw rugs and other things on the floor that can make you trip. What can I do with my stairs? Do not leave any items on the stairs. Make sure that there are handrails on both sides of the stairs and use them. Fix handrails that are broken or loose. Make sure that handrails are as long as the stairways. Check any carpeting to make sure that it is firmly attached to  the stairs. Fix any carpet that is loose or worn. Avoid having throw rugs at the top or bottom of the stairs. If you do have throw rugs, attach them to the floor with carpet tape. Make sure that you have a light switch at the top of the stairs and the bottom of the stairs. If you do not have them, ask someone to add them for you. What else can I do to help prevent falls? Wear shoes that: Do not have high heels. Have rubber bottoms. Are comfortable and fit you well. Are closed at the toe. Do not wear sandals. If you use a stepladder: Make sure that it is fully opened. Do not climb a closed stepladder. Make sure that both sides of the stepladder are locked into place. Ask  someone to hold it for you, if possible. Clearly mark and make sure that you can see: Any grab bars or handrails. First and last steps. Where the edge of each step is. Use tools that help you move around (mobility aids) if they are needed. These include: Canes. Walkers. Scooters. Crutches. Turn on the lights when you go into a dark area. Replace any light bulbs as soon as they burn out. Set up your furniture so you have a clear path. Avoid moving your furniture around. If any of your floors are uneven, fix them. If there are any pets around you, be aware of where they are. Review your medicines with your doctor. Some medicines can make you feel dizzy. This can increase your chance of falling. Ask your doctor what other things that you can do to help prevent falls. This information is not intended to replace advice given to you by your health care provider. Make sure you discuss any questions you have with your health care provider. Document Released: 03/08/2009 Document Revised: 10/18/2015 Document Reviewed: 06/16/2014 Elsevier Interactive Patient Education  2017 [employer].

## 2021-10-16 ENCOUNTER — Ambulatory Visit: Payer: Medicare HMO

## 2021-11-05 ENCOUNTER — Other Ambulatory Visit: Payer: Self-pay | Admitting: Family Medicine

## 2021-11-05 DIAGNOSIS — M25559 Pain in unspecified hip: Secondary | ICD-10-CM

## 2021-11-23 ENCOUNTER — Encounter: Payer: Self-pay | Admitting: Family Medicine

## 2022-02-03 ENCOUNTER — Other Ambulatory Visit: Payer: Self-pay | Admitting: Family Medicine

## 2022-02-03 DIAGNOSIS — M25559 Pain in unspecified hip: Secondary | ICD-10-CM

## 2022-02-28 ENCOUNTER — Other Ambulatory Visit: Payer: Self-pay | Admitting: Family Medicine

## 2022-02-28 DIAGNOSIS — M25559 Pain in unspecified hip: Secondary | ICD-10-CM

## 2022-02-28 NOTE — Telephone Encounter (Signed)
Requested medications are due for refill today.  yes  Requested medications are on the active medications list.  yes  Last refill. 02/03/2022 #30 0 rf  Future visit scheduled.   yes  Notes to clinic. Failed protocol d/t expired labs.    Requested Prescriptions  Pending Prescriptions Disp Refills   meloxicam (MOBIC) 15 MG tablet [Pharmacy Med Name: Meloxicam 15 MG Oral Tablet] 30 tablet 0    Sig: Take 1 tablet by mouth once daily     Analgesics:  COX2 Inhibitors Failed - 02/28/2022  9:18 AM      Failed - Manual Review: Labs are only required if the patient has taken medication for more than 8 weeks.      Failed - AST in normal range and within 360 days    AST  Date Value Ref Range Status  09/04/2020 19 0 - 40 IU/L Final   SGOT(AST)  Date Value Ref Range Status  12/17/2013 15 15 - 37 Unit/L Final         Failed - ALT in normal range and within 360 days    ALT  Date Value Ref Range Status  09/04/2020 19 0 - 44 IU/L Final   SGPT (ALT)  Date Value Ref Range Status  12/17/2013 29 U/L Final    Comment:    14-63 NOTE: New Reference Range 12/13/13          Passed - HGB in normal range and within 360 days    Hemoglobin  Date Value Ref Range Status  09/06/2021 15.1 13.0 - 17.7 g/dL Final         Passed - Cr in normal range and within 360 days    Creatinine  Date Value Ref Range Status  12/17/2013 0.93 0.60 - 1.30 mg/dL Final   Creatinine, Ser  Date Value Ref Range Status  04/03/2021 0.86 0.76 - 1.27 mg/dL Final         Passed - HCT in normal range and within 360 days    Hematocrit  Date Value Ref Range Status  09/06/2021 45.3 37.5 - 51.0 % Final         Passed - eGFR is 30 or above and within 360 days    EGFR (African American)  Date Value Ref Range Status  12/17/2013 >60  Final   GFR calc Af Amer  Date Value Ref Range Status  08/30/2019 101 >59 mL/min/1.73 Final   EGFR (Non-African Amer.)  Date Value Ref Range Status  12/17/2013 >60  Final     Comment:    eGFR values <66m/min/1.73 m2 may be an indication of chronic kidney disease (CKD). Calculated eGFR is useful in patients with stable renal function. The eGFR calculation will not be reliable in acutely ill patients when serum creatinine is changing rapidly. It is not useful in  patients on dialysis. The eGFR calculation may not be applicable to patients at the low and high extremes of body sizes, pregnant women, and vegetarians.    GFR calc non Af Amer  Date Value Ref Range Status  08/30/2019 88 >59 mL/min/1.73 Final   eGFR  Date Value Ref Range Status  04/03/2021 94 >59 mL/min/1.73 Final         Passed - Patient is not pregnant      Passed - Valid encounter within last 12 months    Recent Outpatient Visits           5 months ago Essential hypertension   [place] [facility] at [facility], MD   5 months ago Annual physical exam   [facility] at [place], [place], MD   8 months ago Essential hypertension   [place] [facility] at [place], [place], MD   11 months ago Essential hypertension   [place] [facility] at [place], Deanna C, MD   1 year ago Essential hypertension    [facility] and Sports Medicine at [facility], MD       Future Appointments             In 1 month Juline Patch, MD [facility] and [facility] Medicine at [facility], [place]   In 6 months Juline Patch, MD [facility] Primary Care and Sports Medicine at [facility], [facility]

## 2022-04-03 ENCOUNTER — Ambulatory Visit: Payer: Medicare HMO | Admitting: Family Medicine

## 2022-04-07 ENCOUNTER — Ambulatory Visit (INDEPENDENT_AMBULATORY_CARE_PROVIDER_SITE_OTHER): Payer: Medicare HMO | Admitting: Family Medicine

## 2022-04-07 ENCOUNTER — Encounter: Payer: Self-pay | Admitting: Family Medicine

## 2022-04-07 VITALS — BP 128/78 | HR 77 | Ht 68.0 in | Wt 177.0 lb

## 2022-04-07 DIAGNOSIS — G8929 Other chronic pain: Secondary | ICD-10-CM | POA: Diagnosis not present

## 2022-04-07 DIAGNOSIS — G250 Essential tremor: Secondary | ICD-10-CM | POA: Diagnosis not present

## 2022-04-07 DIAGNOSIS — I10 Essential (primary) hypertension: Secondary | ICD-10-CM

## 2022-04-07 DIAGNOSIS — M25512 Pain in left shoulder: Secondary | ICD-10-CM | POA: Diagnosis not present

## 2022-04-07 DIAGNOSIS — E7801 Familial hypercholesterolemia: Secondary | ICD-10-CM

## 2022-04-07 NOTE — Progress Notes (Signed)
Date:  04/07/2022   Name:  Derek Figueroa   DOB:  Dec 31, 1950   MRN:  [id number]   Chief Complaint: Hypertension and Shoulder Pain  Hypertension This is a chronic problem. The current episode started more than 1 year ago. The problem has been gradually improving since onset. The problem is controlled. Pertinent negatives include no chest pain, palpitations, PND or shortness of breath. There are no associated agents to hypertension. Risk factors for coronary artery disease include dyslipidemia. Past treatments include beta blockers. The current treatment provides mild improvement. There are no compliance problems.  There is no history of angina, kidney disease, CAD/MI, CVA, heart failure, left ventricular hypertrophy, PVD or retinopathy. There is no history of chronic renal disease, a hypertension causing med or renovascular disease.  Shoulder Pain  The pain is present in the left shoulder. This is a chronic problem. The current episode started more than 1 year ago. The problem occurs every several days. The problem has been waxing and waning. The quality of the pain is described as aching. The pain is moderate. Pertinent negatives include no fever, inability to bear weight, itching, joint locking, joint swelling, limited range of motion, numbness, stiffness or tingling. The symptoms are aggravated by contact. He has tried NSAIDS for the symptoms. The treatment provided moderate relief.    Lab Results  Component Value Date   NA 143 04/03/2021   K 4.8 04/03/2021   CO2 24 04/03/2021   GLUCOSE 102 (H) 04/03/2021   BUN 16 04/03/2021   CREATININE 0.86 04/03/2021   CALCIUM 9.3 04/03/2021   EGFR 94 04/03/2021   GFRNONAA 88 08/30/2019   Lab Results  Component Value Date   CHOL 203 (H) 09/06/2021   HDL 49 09/06/2021   LDLCALC 132 (H) 09/06/2021   TRIG 121 09/06/2021   CHOLHDL 3.8 08/20/2018   No results found for: "TSH" No results found for: "HGBA1C" Lab Results  Component Value  Date   WBC 6.9 09/06/2021   HGB 15.1 09/06/2021   HCT 45.3 09/06/2021   MCV 87 09/06/2021   PLT 253 09/06/2021   Lab Results  Component Value Date   ALT 19 09/04/2020   AST 19 09/04/2020   ALKPHOS 58 09/04/2020   BILITOT 0.5 09/04/2020   No results found for: "25OHVITD2", "25OHVITD3", "VD25OH"   Review of Systems  Constitutional:  Negative for fever.  Respiratory:  Negative for chest tightness, shortness of breath and wheezing.   Cardiovascular:  Negative for chest pain, palpitations, leg swelling and PND.  Genitourinary:  Negative for difficulty urinating, dysuria and frequency.  Musculoskeletal:  Negative for stiffness.  Skin:  Negative for itching.  Neurological:  Positive for tremors. Negative for tingling and numbness.  Hematological:  Positive for adenopathy. Bruises/bleeds easily.    Patient Active Problem List   Diagnosis Date Noted   Personal history of colonic polyps    Polyp of transverse colon    Cholecystitis, acute    Obstruction of bile duct    Jaundice    Abnormal findings on imaging of biliary tract    Residual hemorrhoidal skin tags 06/23/2014   Right shoulder pain 01/05/2014   Gastroesophageal reflux disease without esophagitis 01/05/2014   Elevated blood pressure 01/05/2014   History of adenomatous polyp of colon 05/26/2006    No Known Allergies  Past Surgical History:  Procedure Laterality Date   COLONOSCOPY     COLONOSCOPY WITH PROPOFOL N/A 10/11/2019   Procedure: COLONOSCOPY WITH PROPOFOL;  Surgeon: Lucilla Lame,  MD;  Location: [facility] ENDOSCOPY;  Service: Endoscopy;  Laterality: N/A;   ERCP N/A 08/13/2017   Procedure: ENDOSCOPIC RETROGRADE CHOLANGIOPANCREATOGRAPHY (ERCP);  Surgeon: Lucilla Lame, MD;  Location: [facility] ENDOSCOPY;  Service: Endoscopy;  Laterality: N/A;   EYE SURGERY     gall stones     HERNIA REPAIR     ROBOTIC ASSISTED LAPAROSCOPIC CHOLECYSTECTOMY-MULTI SITE N/A 10/20/2017   Procedure: ROBOTIC ASSISTED LAPAROSCOPIC  CHOLECYSTECTOMY-MULTI SITE;  Surgeon: Jules Husbands, MD;  Location: [facility] ORS;  Service: General;  Laterality: N/A;   UMBILICAL HERNIA REPAIR N/A 10/20/2017   Procedure: HERNIA REPAIR UMBILICAL ADULT;  Surgeon: Jules Husbands, MD;  Location: [facility] ORS;  Service: General;  Laterality: N/A;    Social History   Tobacco Use   Smoking status: Former    Types: Cigarettes    Quit date: 09/02/1978    Years since quitting: 43.6   Smokeless tobacco: Never   Tobacco comments:    quit 40 yrs  Vaping Use   Vaping Use: Never used  Substance Use Topics   Alcohol use: Yes    Alcohol/week: 3.0 standard drinks of alcohol    Types: 3 Glasses of wine per week    Comment: occasionally   Drug use: No     Medication list has been reviewed and updated.  Current Meds  Medication Sig   latanoprost (XALATAN) 0.005 % ophthalmic solution Apply to eye. Eye doctor   meloxicam (MOBIC) 15 MG tablet Take 1 tablet by mouth once daily   metoprolol succinate (TOPROL-XL) 50 MG 24 hr tablet TAKE 1 TABLET BY MOUTH ONCE DAILY. TAKE WITH OR IMMEDIATELY FOLLOWING MEAL   Multiple Vitamins-Minerals (MULTIVITAMIN ADULT PO) Take 1 tablet by mouth daily.    Omega-3 Fatty Acids (FISH OIL PO) Take 1 capsule by mouth daily.        04/07/2022    1:58 PM 10/01/2021    9:31 AM 09/06/2021    9:49 AM 06/18/2021    9:50 AM  GAD 7 : Generalized Anxiety Score  Nervous, Anxious, on Edge 0 0 0 0  Control/stop worrying 0 0 0 0  Worry too much - different things 0 0 0 0  Trouble relaxing 0 0 0 0  Restless 0 0 0 0  Easily annoyed or irritable 0 0 0 0  Afraid - awful might happen 0 0 0 0  Total GAD 7 Score 0 0 0 0  Anxiety Difficulty Not difficult at all Not difficult at all Not difficult at all Not difficult at all       04/07/2022    1:57 PM 10/14/2021    8:31 AM 10/01/2021    9:31 AM  Depression screen PHQ 2/9  Decreased Interest 0 0 0  Down, Depressed, Hopeless 0 0 0  PHQ - 2 Score 0 0 0  Altered sleeping 0  0  Tired,  decreased energy 0  0  Change in appetite 0  0  Feeling bad or failure about yourself  0  0  Trouble concentrating 0  0  Moving slowly or fidgety/restless 0  0  Suicidal thoughts 0  0  PHQ-9 Score 0  0  Difficult doing work/chores Not difficult at all  Not difficult at all    BP Readings from Last 3 Encounters:  04/07/22 128/78  10/01/21 138/82  09/06/21 126/84    Physical Exam Vitals and nursing note reviewed.  HENT:     Head: Normocephalic.     Right Ear: Tympanic membrane and  external ear normal.     Left Ear: Tympanic membrane and external ear normal.     Mouth/Throat:     Mouth: Mucous membranes are moist.     Pharynx: No oropharyngeal exudate or posterior oropharyngeal erythema.  Eyes:     Conjunctiva/sclera: Conjunctivae normal.     Pupils: Pupils are equal, round, and reactive to light.  Neck:     Thyroid: No thyromegaly.     Vascular: No JVD.     Trachea: No tracheal deviation.  Cardiovascular:     Rate and Rhythm: Normal rate and regular rhythm.     Heart sounds: Normal heart sounds. No murmur heard.    No systolic murmur is present.     No diastolic murmur is present.     No friction rub. No gallop.  Pulmonary:     Effort: No respiratory distress.     Breath sounds: Normal breath sounds. No wheezing or rales.  Abdominal:     General: Bowel sounds are normal.     Palpations: Abdomen is soft.  Musculoskeletal:        General: No tenderness.     Left shoulder: No swelling, deformity, tenderness or bony tenderness. Decreased range of motion.     Cervical back: Neck supple. No swelling, rigidity, spasms or tenderness. No pain with movement. Normal range of motion.     Comments: Decreased abduction  Lymphadenopathy:     Cervical: No cervical adenopathy.  Skin:    General: Skin is warm.     Findings: No rash.  Neurological:     Mental Status: He is alert.     Wt Readings from Last 3 Encounters:  04/07/22 177 lb (80.3 kg)  10/01/21 182 lb (82.6 kg)   09/06/21 179 lb (81.2 kg)    BP 128/78   Pulse 77   Ht 5' 8" (1.727 m)   Wt 177 lb (80.3 kg)   SpO2 97%   BMI 26.91 kg/m   Assessment and Plan: 1. Chronic left shoulder pain .  Persistent.  Episodic.  Depending on level of activity.  Pain is noted on abduction and patient refers to the area of the supraspinatus deltoid specifically.  Patient is currently on meloxicam we will continue with 15 mg daily.  Patient may use Tylenol on the off hours.  We will x-ray the shoulder to determine if there is a level of arthritis and refer to sports medicine. - DG Shoulder Left  2. Essential tremor Chronic.  Controlled.  Stable.  Currently on metoprolol XL 50 mg which has minimal effect on the essential tremor but does not seem to be bothering him. - Comprehensive Metabolic Panel (CMET)  3. Essential hypertension Chronic.  Controlled.  Stable.  Blood pressure 128/78.  Continue metoprolol XL 50 mg once a day.  Will check CMP for electrolytes and GFR. - Comprehensive Metabolic Panel (CMET)  4. Familial hypercholesterolemia .  Controlled.  Stable.  Reviewed that patient has elevated LDLs in the past we will check lipid panel today to see level of control with diet/omega-3. - Lipid Panel With LDL/HDL Ratio     Otilio Miu, MD

## 2022-04-08 LAB — COMPREHENSIVE METABOLIC PANEL
ALT: 16 IU/L (ref 0–44)
AST: 17 IU/L (ref 0–40)
Albumin/Globulin Ratio: 2 (ref 1.2–2.2)
Albumin: 4.2 g/dL (ref 3.9–4.9)
Alkaline Phosphatase: 61 IU/L (ref 44–121)
BUN/Creatinine Ratio: 16 (ref 10–24)
BUN: 15 mg/dL (ref 8–27)
Bilirubin Total: 0.3 mg/dL (ref 0.0–1.2)
CO2: 24 mmol/L (ref 20–29)
Calcium: 9.2 mg/dL (ref 8.6–10.2)
Chloride: 103 mmol/L (ref 96–106)
Creatinine, Ser: 0.94 mg/dL (ref 0.76–1.27)
Globulin, Total: 2.1 g/dL (ref 1.5–4.5)
Glucose: 89 mg/dL (ref 70–99)
Potassium: 4.5 mmol/L (ref 3.5–5.2)
Sodium: 139 mmol/L (ref 134–144)
Total Protein: 6.3 g/dL (ref 6.0–8.5)
eGFR: 88 mL/min/{1.73_m2} (ref >59)

## 2022-04-08 LAB — LIPID PANEL WITH LDL/HDL RATIO
Cholesterol, Total: 206 mg/dL — ABNORMAL HIGH (ref 100–199)
HDL: 51 mg/dL (ref >39)
LDL Chol Calc (NIH): 138 mg/dL — ABNORMAL HIGH (ref 0–99)
LDL/HDL Ratio: 2.7 ratio (ref 0.0–3.6)
Triglycerides: 94 mg/dL (ref 0–149)
VLDL Cholesterol Cal: 17 mg/dL (ref 5–40)

## 2022-04-09 ENCOUNTER — Encounter: Payer: Self-pay | Admitting: Family Medicine

## 2022-04-09 ENCOUNTER — Ambulatory Visit
Admission: RE | Admit: 2022-04-09 | Discharge: 2022-04-09 | Disposition: A | Discharge status: Home / Self Care | Payer: Medicare HMO | Source: Ambulatory Visit | Attending: Family Medicine | Admitting: Family Medicine

## 2022-04-09 ENCOUNTER — Ambulatory Visit
Admission: RE | Admit: 2022-04-09 | Discharge: 2022-04-09 | Disposition: A | Discharge status: Home / Self Care | Payer: Medicare HMO | Attending: Family Medicine | Admitting: Family Medicine

## 2022-04-09 ENCOUNTER — Ambulatory Visit (INDEPENDENT_AMBULATORY_CARE_PROVIDER_SITE_OTHER): Payer: Medicare HMO | Admitting: Family Medicine

## 2022-04-09 VITALS — BP 122/76 | HR 65 | Ht 68.0 in | Wt 176.0 lb

## 2022-04-09 DIAGNOSIS — M19012 Primary osteoarthritis, left shoulder: Secondary | ICD-10-CM | POA: Diagnosis not present

## 2022-04-09 DIAGNOSIS — M67912 Unspecified disorder of synovium and tendon, left shoulder: Secondary | ICD-10-CM | POA: Diagnosis not present

## 2022-04-09 DIAGNOSIS — G8929 Other chronic pain: Secondary | ICD-10-CM | POA: Diagnosis not present

## 2022-04-09 DIAGNOSIS — M67911 Unspecified disorder of synovium and tendon, right shoulder: Secondary | ICD-10-CM | POA: Diagnosis not present

## 2022-04-09 DIAGNOSIS — M19011 Primary osteoarthritis, right shoulder: Secondary | ICD-10-CM | POA: Diagnosis not present

## 2022-04-09 MED ORDER — DICLOFENAC SODIUM 75 MG PO TBEC: 75.0000 mg | DELAYED_RELEASE_TABLET | Freq: Two times a day (BID) | ORAL | 0 refills | Status: DC

## 2022-04-09 NOTE — Assessment & Plan Note (Addendum)
Right-hand-dominant patient, chronic condition, has been treated in the past with meloxicam which has helped mitigate symptoms though not fully resolved. See additional assessment(s) for plan details.

## 2022-04-09 NOTE — Progress Notes (Signed)
     Primary Care / Sports Medicine Office Visit  Patient Information:  Patient ID: Derek Figueroa, male DOB: [birth year] Age: 71 y.o. MRN: [id number]   Derek Figueroa is a pleasant 71 y.o. male presenting with the following:  Chief Complaint  Patient presents with   shoulder catch    Pt states when move certain ways he can feel his left shoulder catch    Vitals:   04/09/22 0759  BP: 122/76  Pulse: 65  SpO2: 98%   Vitals:   04/09/22 0759  Weight: 176 lb (79.8 kg)  Height: '5\' 8"'$  (1.727 m)   Body mass index is 26.76 kg/m.  No results found.   Independent interpretation of notes and tests performed by another provider:   None  Procedures performed:   None  Pertinent History, Exam, Impression, and Recommendations:   Problem List Items Addressed This Visit       Musculoskeletal and Integument   Tendinopathy of left rotator cuff - Primary    Right-hand-dominant patient presenting with roughly 23-monthhistory of atraumatic left lateral shoulder pain, nonradiating, keeps physically active at baseline.  Pain with ADLs, particularly outstretched arm and overhead motions.  Has been on meloxicam 1-2 years for comorbid right chronic shoulder pain. See additional assessment(s) for plan details.  Range of motion with limited forward flexion on the left when compared to contralateral which is full, external rotation preserved, reach to lower mid scapular region on right, limited on left, resisted RC testing reveals 5/5 strength, painful supraspinatus testing, positive Neer's and Hawkins bilaterally, equivocal speeds and negative Yergason's on the left, contralateral benign, positive O'Brien's bilaterally, nontender about the shoulder, no crepitus, no mechanical obstruction.  Patient's clinical history and findings are most consistent with bilateral rotator cuff related tendinopathy, to evaluate for degenerative component x-rays have been ordered.  From a treatment standpoint,  plan to hold meloxicam given that he is still symptomatic despite his regular dosing.  Transition to twice daily diclofenac with food, [facility] home-based rehab materials provided, and close follow-up in 4 weeks.  Recalcitrant symptoms to be addressed with formal PT, corticosteroid injections, advanced imaging, medication modification, all as clinically guided.      Relevant Medications   diclofenac (VOLTAREN) 75 MG EC tablet   Other Relevant Orders   DG Shoulder Left   Tendinopathy of right rotator cuff    Right-hand-dominant patient, chronic condition, has been treated in the past with meloxicam which has helped mitigate symptoms though not fully resolved. See additional assessment(s) for plan details.      Relevant Medications   diclofenac (VOLTAREN) 75 MG EC tablet   Other Relevant Orders   DG Shoulder Right     Orders & Medications Meds ordered this encounter  Medications   diclofenac (VOLTAREN) 75 MG EC tablet    Sig: Take 1 tablet (75 mg total) by mouth 2 (two) times daily.    Dispense:  60 tablet    Refill:  0   Orders Placed This Encounter  Procedures   DG Shoulder Right   DG Shoulder Left     Return in about 4 weeks (around 05/07/2022).     JMontel Culver MD   Primary Care Sports Medicine [place]

## 2022-04-09 NOTE — Assessment & Plan Note (Signed)
Right-hand-dominant patient presenting with roughly 23-monthhistory of atraumatic left lateral shoulder pain, nonradiating, keeps physically active at baseline.  Pain with ADLs, particularly outstretched arm and overhead motions.  Has been on meloxicam 1-2 years for comorbid right chronic shoulder pain. See additional assessment(s) for plan details.  Range of motion with limited forward flexion on the left when compared to contralateral which is full, external rotation preserved, reach to lower mid scapular region on right, limited on left, resisted RC testing reveals 5/5 strength, painful supraspinatus testing, positive Neer's and Hawkins bilaterally, equivocal speeds and negative Yergason's on the left, contralateral benign, positive O'Brien's bilaterally, nontender about the shoulder, no crepitus, no mechanical obstruction.  Patient's clinical history and findings are most consistent with bilateral rotator cuff related tendinopathy, to evaluate for degenerative component x-rays have been ordered.  From a treatment standpoint, plan to hold meloxicam given that he is still symptomatic despite his regular dosing.  Transition to twice daily diclofenac with food, [facility] home-based rehab materials provided, and close follow-up in 4 weeks.  Recalcitrant symptoms to be addressed with formal PT, corticosteroid injections, advanced imaging, medication modification, all as clinically guided.

## 2022-04-09 NOTE — Patient Instructions (Signed)
-   Obtain x-rays today - Stop meloxicam - Start diclofenac twice daily with food - Start home exercises (stretching daily, strengthening 2 times per week) - Perform activity as tolerated using shoulder symptoms as a guide - Return for follow-up in 4 weeks

## 2022-05-07 ENCOUNTER — Encounter: Payer: Self-pay | Admitting: Family Medicine

## 2022-05-07 ENCOUNTER — Ambulatory Visit (INDEPENDENT_AMBULATORY_CARE_PROVIDER_SITE_OTHER): Payer: Medicare HMO | Admitting: Family Medicine

## 2022-05-07 DIAGNOSIS — M67911 Unspecified disorder of synovium and tendon, right shoulder: Secondary | ICD-10-CM

## 2022-05-07 DIAGNOSIS — M67912 Unspecified disorder of synovium and tendon, left shoulder: Secondary | ICD-10-CM

## 2022-05-07 MED ORDER — DICLOFENAC SODIUM 75 MG PO TBEC: 75.0000 mg | DELAYED_RELEASE_TABLET | Freq: Two times a day (BID) | ORAL | 1 refills | Status: DC | PRN

## 2022-05-07 NOTE — Patient Instructions (Addendum)
-   Transition to as-needed dosing of diclofenac once you pick up the refill - Start physical, recommend contacting [facility] directly for expedited scheduling - Perform home exercises as overseen/instructed by [facility] - Return in 2 months - Contact us for any questions / concerns / shoulder pain that prevent PT between now and then

## 2022-05-09 NOTE — Assessment & Plan Note (Signed)
Demonstrated interval improvement without resolution. Discussed various strategies and will pursue formal PT, use PRN diclofenac and return in 2 months. Suboptimal progress to abe addressed with CSI, imaging, etc.

## 2022-05-09 NOTE — Assessment & Plan Note (Signed)
See additional assessment(s) for plan details. 

## 2022-05-09 NOTE — Progress Notes (Signed)
     Primary Care / Sports Medicine Office Visit  Patient Information:  Patient ID: Derek Figueroa, male DOB: 1951-03-29 Age: 71 y.o. MRN: [id number]   Derek Figueroa is a pleasant 71 y.o. male presenting with the following:  Chief Complaint  Patient presents with   Tendinopathy of left rotator cuff    Pt states he thinks it is about the same    Vitals:   05/07/22 0919  BP: 122/76  Pulse: 60  SpO2: 98%   Vitals:   05/07/22 0919  Weight: 173 lb (78.5 kg)  Height: '5\' 8"'$  (1.727 m)   Body mass index is 26.3 kg/m.  No results found.   Independent interpretation of notes and tests performed by another provider:   None  Procedures performed:   None  Pertinent History, Exam, Impression, and Recommendations:   Problem List Items Addressed This Visit       Musculoskeletal and Integument   Tendinopathy of left rotator cuff    Demonstrated interval improvement without resolution. Discussed various strategies and will pursue formal PT, use PRN diclofenac and return in 2 months. Suboptimal progress to abe addressed with CSI, imaging, etc.      Relevant Medications   diclofenac (VOLTAREN) 75 MG EC tablet   Other Relevant Orders   Ambulatory referral to Physical Therapy   Tendinopathy of right rotator cuff    See additional assessment(s) for plan details.      Relevant Medications   diclofenac (VOLTAREN) 75 MG EC tablet   Other Relevant Orders   Ambulatory referral to Physical Therapy     Orders & Medications Meds ordered this encounter  Medications   diclofenac (VOLTAREN) 75 MG EC tablet    Sig: Take 1 tablet (75 mg total) by mouth 2 (two) times daily as needed (shoulder pain).    Dispense:  60 tablet    Refill:  1   Orders Placed This Encounter  Procedures   Ambulatory referral to Physical Therapy     Return in about 2 months (around 07/08/2022).     Montel Culver, MD, [facility]   Primary Care Sports Medicine Primary Care and Sports Medicine  at [facility]

## 2022-05-12 ENCOUNTER — Telehealth: Payer: Self-pay | Admitting: Family Medicine

## 2022-05-12 NOTE — Telephone Encounter (Signed)
Copied from [place] [id number]. Topic: Referral - Status >> May 12, 2022  9:52 AM Sabas Sous wrote: Reason for CRM: Pt called reporting that he spoke to [facility] [place] today regarding his referral and was told that they do not have it. He called the office requesting for this to be submitted today since he has an appt with them soon. >> May 12, 2022  9:55 AM Oley Balm E wrote: Pt wants to be contacted today once this is completed

## 2022-05-12 NOTE — Telephone Encounter (Signed)
Can you check on this when you have a moment. It was just placed on Friday 12/15.

## 2022-05-22 DIAGNOSIS — M25511 Pain in right shoulder: Secondary | ICD-10-CM | POA: Diagnosis not present

## 2022-05-22 DIAGNOSIS — R293 Abnormal posture: Secondary | ICD-10-CM | POA: Diagnosis not present

## 2022-05-22 DIAGNOSIS — M25512 Pain in left shoulder: Secondary | ICD-10-CM | POA: Diagnosis not present

## 2022-05-26 ENCOUNTER — Encounter: Payer: Self-pay | Admitting: Family Medicine

## 2022-05-27 ENCOUNTER — Other Ambulatory Visit: Payer: Self-pay

## 2022-05-27 DIAGNOSIS — R293 Abnormal posture: Secondary | ICD-10-CM | POA: Diagnosis not present

## 2022-05-27 DIAGNOSIS — G250 Essential tremor: Secondary | ICD-10-CM

## 2022-05-27 DIAGNOSIS — I10 Essential (primary) hypertension: Secondary | ICD-10-CM

## 2022-05-27 DIAGNOSIS — M25511 Pain in right shoulder: Secondary | ICD-10-CM | POA: Diagnosis not present

## 2022-05-27 DIAGNOSIS — M25512 Pain in left shoulder: Secondary | ICD-10-CM | POA: Diagnosis not present

## 2022-05-27 MED ORDER — METOPROLOL SUCCINATE ER 50 MG PO TB24: ORAL_TABLET | ORAL | 1 refills | Status: DC

## 2022-05-29 DIAGNOSIS — R293 Abnormal posture: Secondary | ICD-10-CM | POA: Diagnosis not present

## 2022-05-29 DIAGNOSIS — M25511 Pain in right shoulder: Secondary | ICD-10-CM | POA: Diagnosis not present

## 2022-05-29 DIAGNOSIS — M25512 Pain in left shoulder: Secondary | ICD-10-CM | POA: Diagnosis not present

## 2022-06-04 DIAGNOSIS — M25511 Pain in right shoulder: Secondary | ICD-10-CM | POA: Diagnosis not present

## 2022-06-04 DIAGNOSIS — R293 Abnormal posture: Secondary | ICD-10-CM | POA: Diagnosis not present

## 2022-06-04 DIAGNOSIS — M25512 Pain in left shoulder: Secondary | ICD-10-CM | POA: Diagnosis not present

## 2022-06-05 DIAGNOSIS — H35372 Puckering of macula, left eye: Secondary | ICD-10-CM | POA: Diagnosis not present

## 2022-06-05 DIAGNOSIS — H43822 Vitreomacular adhesion, left eye: Secondary | ICD-10-CM | POA: Diagnosis not present

## 2022-06-05 DIAGNOSIS — H40023 Open angle with borderline findings, high risk, bilateral: Secondary | ICD-10-CM | POA: Diagnosis not present

## 2022-06-06 DIAGNOSIS — M25511 Pain in right shoulder: Secondary | ICD-10-CM | POA: Diagnosis not present

## 2022-06-06 DIAGNOSIS — R293 Abnormal posture: Secondary | ICD-10-CM | POA: Diagnosis not present

## 2022-06-06 DIAGNOSIS — M25512 Pain in left shoulder: Secondary | ICD-10-CM | POA: Diagnosis not present

## 2022-06-11 DIAGNOSIS — M25511 Pain in right shoulder: Secondary | ICD-10-CM | POA: Diagnosis not present

## 2022-06-11 DIAGNOSIS — M25512 Pain in left shoulder: Secondary | ICD-10-CM | POA: Diagnosis not present

## 2022-06-11 DIAGNOSIS — R293 Abnormal posture: Secondary | ICD-10-CM | POA: Diagnosis not present

## 2022-06-13 DIAGNOSIS — M25512 Pain in left shoulder: Secondary | ICD-10-CM | POA: Diagnosis not present

## 2022-06-13 DIAGNOSIS — M25511 Pain in right shoulder: Secondary | ICD-10-CM | POA: Diagnosis not present

## 2022-06-13 DIAGNOSIS — R293 Abnormal posture: Secondary | ICD-10-CM | POA: Diagnosis not present

## 2022-06-18 DIAGNOSIS — M25511 Pain in right shoulder: Secondary | ICD-10-CM | POA: Diagnosis not present

## 2022-06-18 DIAGNOSIS — M25512 Pain in left shoulder: Secondary | ICD-10-CM | POA: Diagnosis not present

## 2022-06-18 DIAGNOSIS — R293 Abnormal posture: Secondary | ICD-10-CM | POA: Diagnosis not present

## 2022-06-20 DIAGNOSIS — M25512 Pain in left shoulder: Secondary | ICD-10-CM | POA: Diagnosis not present

## 2022-06-20 DIAGNOSIS — M25511 Pain in right shoulder: Secondary | ICD-10-CM | POA: Diagnosis not present

## 2022-06-20 DIAGNOSIS — R293 Abnormal posture: Secondary | ICD-10-CM | POA: Diagnosis not present

## 2022-07-08 ENCOUNTER — Encounter: Payer: Self-pay | Admitting: Family Medicine

## 2022-07-08 ENCOUNTER — Ambulatory Visit (INDEPENDENT_AMBULATORY_CARE_PROVIDER_SITE_OTHER): Payer: Medicare HMO | Admitting: Family Medicine

## 2022-07-08 DIAGNOSIS — M67912 Unspecified disorder of synovium and tendon, left shoulder: Secondary | ICD-10-CM | POA: Diagnosis not present

## 2022-07-08 DIAGNOSIS — M67911 Unspecified disorder of synovium and tendon, right shoulder: Secondary | ICD-10-CM | POA: Diagnosis not present

## 2022-07-08 MED ORDER — DICLOFENAC SODIUM 75 MG PO TBEC: 75.0000 mg | DELAYED_RELEASE_TABLET | Freq: Two times a day (BID) | ORAL | 0 refills | Status: DC | PRN

## 2022-07-08 NOTE — Progress Notes (Signed)
     Primary Care / Sports Medicine Office Visit  Patient Information:  Patient ID: Derek Figueroa, male DOB: 09/14/1950 Age: 72 y.o. MRN: [id number]   Derek Figueroa is a pleasant 72 y.o. male presenting with the following:  Chief Complaint  Patient presents with   Tendinopathy of left rotator cuff    Vitals:   07/08/22 0939  BP: 132/82  Pulse: 78  SpO2: 97%   Vitals:   07/08/22 0939  Weight: 183 lb (83 kg)  Height: '5\' 8"'$  (1.727 m)   Body mass index is 27.83 kg/m.  No results found.   Independent interpretation of notes and tests performed by another provider:   None  Procedures performed:   None  Pertinent History, Exam, Impression, and Recommendations:   Derek Figueroa was seen today for tendinopathy of left rotator cuff.  Tendinopathy of left rotator cuff Assessment & Plan: Demonstrating interval improvement, has completed physical therapy and participates in home exercises on a regular basis.  He is still dosing diclofenac twice a day with minimal disruption with ADLs, primarily noted pain with reaching behind his back, putting his belt with the belt loops.  Examination with essentially painless full range of motion, with only minor discomfort during maximal extension/internal rotation, RC strength 5/5 bilaterally, negative impingement, negative empty can.   Interval clinical progress, advised transition to as needed diclofenac twice daily, further transition to meloxicam, continued home exercises, and can contact us for any interim worsening.  Orders: -     Diclofenac Sodium; Take 1 tablet (75 mg total) by mouth 2 (two) times daily as needed (shoulder pain).  Dispense: 60 tablet; Refill: 0  Tendinopathy of right rotator cuff Assessment & Plan: See additional assessment(s) for plan details.  Orders: -     Diclofenac Sodium; Take 1 tablet (75 mg total) by mouth 2 (two) times daily as needed (shoulder pain).  Dispense: 60 tablet; Refill: 0     Orders  & Medications Meds ordered this encounter  Medications   diclofenac (VOLTAREN) 75 MG EC tablet    Sig: Take 1 tablet (75 mg total) by mouth 2 (two) times daily as needed (shoulder pain).    Dispense:  60 tablet    Refill:  0   No orders of the defined types were placed in this encounter.    No follow-ups on file.     Montel Culver, MD, [facility]   Primary Care Sports Medicine Primary Care and Sports Medicine at [facility]

## 2022-07-08 NOTE — Patient Instructions (Signed)
-   Transition to twice daily diclofenac (take with food) - After 1 week, can further transition to meloxicam daily - Continue home exercises - Contact us for any plateauing improvement or worsening symptoms to discuss next steps - Follow-up as needed

## 2022-07-08 NOTE — Assessment & Plan Note (Signed)
Demonstrating interval improvement, has completed physical therapy and participates in home exercises on a regular basis.  He is still dosing diclofenac twice a day with minimal disruption with ADLs, primarily noted pain with reaching behind his back, putting his belt with the belt loops.  Examination with essentially painless full range of motion, with only minor discomfort during maximal extension/internal rotation, RC strength 5/5 bilaterally, negative impingement, negative empty can.   Interval clinical progress, advised transition to as needed diclofenac twice daily, further transition to meloxicam, continued home exercises, and can contact us for any interim worsening.

## 2022-07-08 NOTE — Assessment & Plan Note (Signed)
See additional assessment(s) for plan details. 

## 2022-07-29 ENCOUNTER — Other Ambulatory Visit: Payer: Self-pay | Admitting: Family Medicine

## 2022-07-29 DIAGNOSIS — M25559 Pain in unspecified hip: Secondary | ICD-10-CM

## 2022-07-29 NOTE — Telephone Encounter (Signed)
Medication Refill - Medication: meloxicam (MOBIC) 15 MG tablet   Has the patient contacted their pharmacy? No. Pt stated there are no refills remaining  Preferred Pharmacy (with phone number or street name):  [facility], [place] Phone: [phone]  Fax: [phone]     Has the patient been seen for an appointment in the last year OR does the patient have an upcoming appointment? Yes.    Agent: Please be advised that RX refills may take up to 3 business days. We ask that you follow-up with your pharmacy.

## 2022-07-29 NOTE — Telephone Encounter (Signed)
Requested Prescriptions  Refused Prescriptions Disp Refills   meloxicam (MOBIC) 15 MG tablet 30 tablet 0    Sig: Take 1 tablet (15 mg total) by mouth daily.     Analgesics:  COX2 Inhibitors Failed - 07/29/2022  1:41 PM      Failed - Manual Review: Labs are only required if the patient has taken medication for more than 8 weeks.      Passed - HGB in normal range and within 360 days    Hemoglobin  Date Value Ref Range Status  09/06/2021 15.1 13.0 - 17.7 g/dL Final         Passed - Cr in normal range and within 360 days    Creatinine  Date Value Ref Range Status  12/17/2013 0.93 0.60 - 1.30 mg/dL Final   Creatinine, Ser  Date Value Ref Range Status  04/07/2022 0.94 0.76 - 1.27 mg/dL Final         Passed - HCT in normal range and within 360 days    Hematocrit  Date Value Ref Range Status  09/06/2021 45.3 37.5 - 51.0 % Final         Passed - AST in normal range and within 360 days    AST  Date Value Ref Range Status  04/07/2022 17 0 - 40 IU/L Final   SGOT(AST)  Date Value Ref Range Status  12/17/2013 15 15 - 37 Unit/L Final         Passed - ALT in normal range and within 360 days    ALT  Date Value Ref Range Status  04/07/2022 16 0 - 44 IU/L Final   SGPT (ALT)  Date Value Ref Range Status  12/17/2013 29 U/L Final    Comment:    14-63 NOTE: New Reference Range 12/13/13          Passed - eGFR is 30 or above and within 360 days    EGFR (African American)  Date Value Ref Range Status  12/17/2013 >60  Final   GFR calc Af Amer  Date Value Ref Range Status  08/30/2019 101 >59 mL/min/1.73 Final   EGFR (Non-African Amer.)  Date Value Ref Range Status  12/17/2013 >60  Final    Comment:    eGFR values <29m/min/1.73 m2 may be an indication of chronic kidney disease (CKD). Calculated eGFR is useful in patients with stable renal function. The eGFR calculation will not be reliable in acutely ill patients when serum creatinine is changing rapidly. It is not useful  in  patients on dialysis. The eGFR calculation may not be applicable to patients at the low and high extremes of body sizes, pregnant women, and vegetarians.    GFR calc non Af Amer  Date Value Ref Range Status  08/30/2019 88 >59 mL/min/1.73 Final   eGFR  Date Value Ref Range Status  04/07/2022 88 >59 mL/min/1.73 Final         Passed - Patient is not pregnant      Passed - Valid encounter within last 12 months    Recent Outpatient Visits           3 weeks ago Tendinopathy of left rotator cuff   [facility] [place]   2 months ago Tendinopathy of left rotator cuff   [place] [facility]   3 months ago Tendinopathy of left rotator cuff   [facility] [place], Earley Abide, MD   3 months ago Chronic left shoulder pain   [facility] [place], Deanna C, MD   10 months ago Essential hypertension   [facility], MD       Future Appointments             In 1 month Juline Patch, MD [place] at [facility], [place]   In 2 months Juline Patch, MD [place] at [facility], [facility]

## 2022-08-01 NOTE — Telephone Encounter (Signed)
CB- [id number] Pt is calling to check on the status of his medication refill. Please advise

## 2022-08-04 ENCOUNTER — Encounter: Payer: Self-pay | Admitting: Family Medicine

## 2022-08-04 ENCOUNTER — Other Ambulatory Visit: Payer: Self-pay

## 2022-08-04 DIAGNOSIS — M25559 Pain in unspecified hip: Secondary | ICD-10-CM

## 2022-08-04 MED ORDER — MELOXICAM 15 MG PO TABS: 15.0000 mg | ORAL_TABLET | Freq: Every day | ORAL | 0 refills | Status: DC

## 2022-09-03 ENCOUNTER — Encounter: Payer: Self-pay | Admitting: Family Medicine

## 2022-09-05 ENCOUNTER — Encounter: Payer: Self-pay | Admitting: Family Medicine

## 2022-09-05 ENCOUNTER — Ambulatory Visit (INDEPENDENT_AMBULATORY_CARE_PROVIDER_SITE_OTHER): Payer: Medicare HMO | Admitting: Family Medicine

## 2022-09-05 VITALS — BP 126/70 | HR 84 | Ht 68.0 in | Wt 183.0 lb

## 2022-09-05 DIAGNOSIS — Q278 Other specified congenital malformations of peripheral vascular system: Secondary | ICD-10-CM | POA: Diagnosis not present

## 2022-09-05 NOTE — Progress Notes (Signed)
Date:  09/05/2022   Name:  Derek Figueroa   DOB:  [birth year]   MRN:  [id number]   Chief Complaint: Hand Injury (BB sized place in palm of L) hand. Sometimes it is more prominent than others- no injury to hand that he is aware of.)  Hand Injury  The incident occurred more than 1 week ago. There was no injury mechanism. The pain is present in the left hand (base of thenar eminence). Quality: none. The patient is experiencing no pain. The pain has been Fluctuating (size) since the incident. Pertinent negatives include no chest pain, muscle weakness, numbness or tingling. Exacerbated by: prolonged driving. He has tried nothing for the symptoms.    Lab Results  Component Value Date   NA 139 04/07/2022   K 4.5 04/07/2022   CO2 24 04/07/2022   GLUCOSE 89 04/07/2022   BUN 15 04/07/2022   CREATININE 0.94 04/07/2022   CALCIUM 9.2 04/07/2022   EGFR 88 04/07/2022   GFRNONAA 88 08/30/2019   Lab Results  Component Value Date   CHOL 206 (H) 04/07/2022   HDL 51 04/07/2022   LDLCALC 138 (H) 04/07/2022   TRIG 94 04/07/2022   CHOLHDL 3.8 08/20/2018   No results found for: "TSH" No results found for: "HGBA1C" Lab Results  Component Value Date   WBC 6.9 09/06/2021   HGB 15.1 09/06/2021   HCT 45.3 09/06/2021   MCV 87 09/06/2021   PLT 253 09/06/2021   Lab Results  Component Value Date   ALT 16 04/07/2022   AST 17 04/07/2022   ALKPHOS 61 04/07/2022   BILITOT 0.3 04/07/2022   No results found for: "25OHVITD2", "25OHVITD3", "VD25OH"   Review of Systems  Constitutional:  Negative for fatigue.  HENT:  Negative for rhinorrhea and trouble swallowing.   Respiratory:  Negative for chest tightness, shortness of breath and wheezing.   Cardiovascular:  Negative for chest pain, palpitations and leg swelling.  Gastrointestinal:  Negative for abdominal distention and abdominal pain.  Neurological:  Negative for tingling and numbness.    Patient Active Problem List   Diagnosis Date Noted    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   Tendinopathy of right rotator cuff 04/09/2022   Personal history of colonic polyps    Polyp of transverse colon    Cholecystitis, acute    Obstruction of bile duct    Jaundice    Abnormal findings on imaging of biliary tract    Residual hemorrhoidal skin tags 06/23/2014   Right shoulder pain 01/05/2014   Gastroesophageal reflux disease without esophagitis 01/05/2014   Elevated blood pressure 01/05/2014   History of adenomatous polyp of colon 05/26/2006    No Known Allergies  Past Surgical History:  Procedure Laterality Date   COLONOSCOPY     COLONOSCOPY WITH PROPOFOL N/A 10/11/2019   Procedure: COLONOSCOPY WITH PROPOFOL;  Surgeon: Midge Minium, MD;  [facility]COPY;  Service: Endoscopy;  Laterality: N/A;   ERCP N/A 08/13/2017   Procedure: ENDOSCOPIC RETROGRADE CHOLANGIOPANCREATOGRAPHY (ERCP);  Surgeon: Midge Minium, MD;  Location: [facility]OSCOPY;  Service: Endoscopy;  Laterality: N/A;   EYE SURGERY     gall stones     HERNIA REPAIR     ROBOTIC ASSISTED LAPAROSCOPIC CHOLECYSTECTOMY-MULTI SITE N/A 10/20/2017   Procedure: ROBOTIC ASSISTED LAPAROSCOPIC CHOLECYSTECTOMY-MULTI SITE;  Surgeon: Leafy Ro, MD;  Location: [facility] ORS;  Service: General;  Laterality: N/A;   UMBILICAL HERNIA REPAIR N/A 10/20/2017   Procedure: HERNIA REPAIR UMBILICAL ADULT;  Surgeon: Everlene Farrier,  Merri Ray, MD;  Location: [facility] ORS;  Service: General;  Laterality: N/A;    Social History   Tobacco Use   Smoking status: Former    Types: Cigarettes    Quit date: 09/02/1978    Years since quitting: 44.0   Smokeless tobacco: Never   Tobacco comments:    quit 40 yrs  Vaping Use   Vaping Use: Never used  Substance Use Topics   Alcohol use: Yes    Alcohol/week: 3.0 standard drinks of alcohol    Types: 3 Glasses of wine per week    Comment: occasionally   Drug use: No     Medication list has been reviewed and updated.  Current Meds  Medication Sig    latanoprost (XALATAN) 0.005 % ophthalmic solution Apply to eye. Eye doctor   meloxicam (MOBIC) 15 MG tablet Take 1 tablet (15 mg total) by mouth daily.   metoprolol succinate (TOPROL-XL) 50 MG 24 hr tablet TAKE 1 TABLET BY MOUTH ONCE DAILY. TAKE WITH OR IMMEDIATELY FOLLOWING MEAL   Multiple Vitamins-Minerals (MULTIVITAMIN ADULT PO) Take 1 tablet by mouth daily.    Omega-3 Fatty Acids (FISH OIL PO) Take 1 capsule by mouth daily.        09/05/2022    3:16 PM 04/07/2022    1:58 PM 10/01/2021    9:31 AM 09/06/2021    9:49 AM  GAD 7 : Generalized Anxiety Score  Nervous, Anxious, on Edge 0 0 0 0  Control/stop worrying 0 0 0 0  Worry too much - different things 0 0 0 0  Trouble relaxing 0 0 0 0  Restless 0 0 0 0  Easily annoyed or irritable 0 0 0 0  Afraid - awful might happen 0 0 0 0  Total GAD 7 Score 0 0 0 0  Anxiety Difficulty Not difficult at all Not difficult at all Not difficult at all Not difficult at all       09/05/2022    3:15 PM 04/07/2022    1:57 PM 10/14/2021    8:31 AM  Depression screen PHQ 2/9  Decreased Interest 0 0 0  Down, Depressed, Hopeless 0 0 0  PHQ - 2 Score 0 0 0  Altered sleeping 0 0   Tired, decreased energy 0 0   Change in appetite 0 0   Feeling bad or failure about yourself  0 0   Trouble concentrating 0 0   Moving slowly or fidgety/restless 0 0   Suicidal thoughts 0 0   PHQ-9 Score 0 0   Difficult doing work/chores Not difficult at all Not difficult at all     BP Readings from Last 3 Encounters:  09/05/22 126/70  07/08/22 132/82  05/07/22 122/76    Physical Exam Vitals and nursing note reviewed.  HENT:     Right Ear: Tympanic membrane and ear canal normal.     Left Ear: Tympanic membrane and ear canal normal.     Nose: Nose normal. No congestion or rhinorrhea.     Mouth/Throat:     Mouth: Mucous membranes are moist.     Pharynx: No oropharyngeal exudate or posterior oropharyngeal erythema.  Eyes:     Extraocular Movements: Extraocular  movements intact.     Conjunctiva/sclera: Conjunctivae normal.     Pupils: Pupils are equal, round, and reactive to light.  Cardiovascular:     Rate and Rhythm: Normal rate and regular rhythm.     Heart sounds: No murmur heard.    No  friction rub. No gallop.  Pulmonary:     Effort: Pulmonary effort is normal.  Abdominal:     General: Abdomen is flat.  Musculoskeletal:     Cervical back: Normal range of motion.  Neurological:     Mental Status: He is alert.     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  As a bluish hue and firm almost nodular I am wondering if this is a venous anomaly of the systemic nature versus a venous valve.  Or venous lake of his warts.  We will continue to watch and if in enlarges or becomes painful we will repeat further action.    Elizabeth Sauer, MD

## 2022-09-10 ENCOUNTER — Encounter: Payer: Medicare HMO | Admitting: Family Medicine

## 2022-09-19 ENCOUNTER — Encounter: Payer: Self-pay | Admitting: Family Medicine

## 2022-09-19 ENCOUNTER — Other Ambulatory Visit: Payer: Self-pay

## 2022-09-19 DIAGNOSIS — K2100 Gastro-esophageal reflux disease with esophagitis, without bleeding: Secondary | ICD-10-CM

## 2022-09-19 MED ORDER — ESOMEPRAZOLE MAGNESIUM 40 MG PO CPDR
40.0000 mg | DELAYED_RELEASE_CAPSULE | Freq: Every day | ORAL | 0 refills | Status: DC
Start: 2022-09-19 — End: 2022-10-06

## 2022-09-25 ENCOUNTER — Encounter: Payer: Self-pay | Admitting: Family Medicine

## 2022-09-26 ENCOUNTER — Encounter: Payer: Self-pay | Admitting: Family Medicine

## 2022-09-26 ENCOUNTER — Ambulatory Visit (INDEPENDENT_AMBULATORY_CARE_PROVIDER_SITE_OTHER): Payer: Medicare HMO | Admitting: Family Medicine

## 2022-09-26 VITALS — BP 120/78 | HR 56 | Ht 68.0 in | Wt 182.0 lb

## 2022-09-26 DIAGNOSIS — Z0000 Encounter for general adult medical examination without abnormal findings: Secondary | ICD-10-CM

## 2022-09-26 DIAGNOSIS — I10 Essential (primary) hypertension: Secondary | ICD-10-CM | POA: Diagnosis not present

## 2022-09-26 DIAGNOSIS — E7800 Pure hypercholesterolemia, unspecified: Secondary | ICD-10-CM

## 2022-09-26 LAB — HEMOCCULT GUIAC POC 1CARD (OFFICE): Fecal Occult Blood, POC: NEGATIVE

## 2022-09-26 LAB — POCT URINALYSIS DIPSTICK
Bilirubin, UA: NEGATIVE
Blood, UA: NEGATIVE
Glucose, UA: NEGATIVE
Ketones, UA: NEGATIVE
Leukocytes, UA: NEGATIVE
Nitrite, UA: NEGATIVE
Protein, UA: NEGATIVE
Spec Grav, UA: 1.01 (ref 1.010–1.025)
Urobilinogen, UA: 0.2 E.U./dL
pH, UA: 6 (ref 5.0–8.0)

## 2022-09-26 NOTE — Progress Notes (Signed)
Date:  09/26/2022   Name:  Derek Figueroa   DOB:  [birth year]   MRN:  [id number]   Chief Complaint: Annual Exam  .Patient is a 72 year old male who presents for a comprehensive physical exam. The patient reports the following problems: none. Health maintenance has been reviewed up to date.      Lab Results  Component Value Date   NA 139 04/07/2022   K 4.5 04/07/2022   CO2 24 04/07/2022   GLUCOSE 89 04/07/2022   BUN 15 04/07/2022   CREATININE 0.94 04/07/2022   CALCIUM 9.2 04/07/2022   EGFR 88 04/07/2022   GFRNONAA 88 08/30/2019   Lab Results  Component Value Date   CHOL 206 (H) 04/07/2022   HDL 51 04/07/2022   LDLCALC 138 (H) 04/07/2022   TRIG 94 04/07/2022   CHOLHDL 3.8 08/20/2018   No results found for: "TSH" No results found for: "HGBA1C" Lab Results  Component Value Date   WBC 6.9 09/06/2021   HGB 15.1 09/06/2021   HCT 45.3 09/06/2021   MCV 87 09/06/2021   PLT 253 09/06/2021   Lab Results  Component Value Date   ALT 16 04/07/2022   AST 17 04/07/2022   ALKPHOS 61 04/07/2022   BILITOT 0.3 04/07/2022   No results found for: "25OHVITD2", "25OHVITD3", "VD25OH"   Review of Systems  Constitutional:  Negative for chills, fever and unexpected weight change.  HENT:  Negative for drooling, ear discharge, ear pain, sore throat and trouble swallowing.   Eyes:  Negative for visual disturbance.  Respiratory:  Negative for cough, chest tightness, shortness of breath and wheezing.   Cardiovascular:  Negative for chest pain, palpitations and leg swelling.  Gastrointestinal:  Negative for abdominal pain, blood in stool, constipation, diarrhea and nausea.  Endocrine: Negative for polydipsia, polyphagia and polyuria.  Genitourinary:  Negative for dysuria, frequency, hematuria and urgency.  Musculoskeletal:  Negative for arthralgias, back pain, myalgias and neck pain.  Skin:  Negative for rash.  Allergic/Immunologic: Negative for environmental allergies.   Neurological:  Negative for dizziness, light-headedness and headaches.  Hematological:  Negative for adenopathy. Does not bruise/bleed easily.  Psychiatric/Behavioral:  Negative for suicidal ideas. The patient is not nervous/anxious.     Patient Active Problem List   Diagnosis Date Noted   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   Tendinopathy of right rotator cuff 04/09/2022   Personal history of colonic polyps    Polyp of transverse colon    Cholecystitis, acute    Obstruction of bile duct    Jaundice    Abnormal findings on imaging of biliary tract    Residual hemorrhoidal skin tags 06/23/2014   Right shoulder pain 01/05/2014   Gastroesophageal reflux disease without esophagitis 01/05/2014   Elevated blood pressure 01/05/2014   History of adenomatous polyp of colon 05/26/2006    No Known Allergies  Past Surgical History:  Procedure Laterality Date   COLONOSCOPY     COLONOSCOPY WITH PROPOFOL N/A 10/11/2019   Procedure: COLONOSCOPY WITH PROPOFOL;  Surgeon: Midge Minium, MD;  Location: [facility] ENDOSCOPY;  Service: Endoscopy;  Laterality: N/A;   ERCP N/A 08/13/2017   Procedure: ENDOSCOPIC RETROGRADE CHOLANGIOPANCREATOGRAPHY (ERCP);  Surgeon: Midge Minium, MD;  Location: [facility] ENDOSCOPY;  Service: Endoscopy;  Laterality: N/A;   EYE SURGERY     gall stones     HERNIA REPAIR     ROBOTIC ASSISTED LAPAROSCOPIC CHOLECYSTECTOMY-MULTI SITE N/A 10/20/2017   Procedure: ROBOTIC ASSISTED LAPAROSCOPIC CHOLECYSTECTOMY-MULTI SITE;  Surgeon: Sterling Big  F, MD;  Location: [facility] ORS;  Service: General;  Laterality: N/A;   UMBILICAL HERNIA REPAIR N/A 10/20/2017   Procedure: HERNIA REPAIR UMBILICAL ADULT;  Surgeon: Leafy Ro, MD;  Location: [facility] ORS;  Service: General;  Laterality: N/A;    Social History   Tobacco Use   Smoking status: Former    Types: Cigarettes    Quit date: 09/02/1978    Years since quitting: 44.0   Smokeless tobacco: Never   Tobacco comments:    quit 40 yrs  Vaping  Use   Vaping Use: Never used  Substance Use Topics   Alcohol use: Yes    Alcohol/week: 3.0 standard drinks of alcohol    Types: 3 Glasses of wine per week    Comment: occasionally   Drug use: No     Medication list has been reviewed and updated.  Current Meds  Medication Sig   esomeprazole (NEXIUM) 40 MG capsule Take 1 capsule (40 mg total) by mouth daily.   latanoprost (XALATAN) 0.005 % ophthalmic solution Apply to eye. Eye doctor   meloxicam (MOBIC) 15 MG tablet Take 1 tablet (15 mg total) by mouth daily.   metoprolol succinate (TOPROL-XL) 50 MG 24 hr tablet TAKE 1 TABLET BY MOUTH ONCE DAILY. TAKE WITH OR IMMEDIATELY FOLLOWING MEAL   Multiple Vitamins-Minerals (MULTIVITAMIN ADULT PO) Take 1 tablet by mouth daily.    Omega-3 Fatty Acids (FISH OIL PO) Take 1 capsule by mouth daily.    Probiotic Product (PROBIOTIC-10 PO)        09/26/2022    9:03 AM 09/05/2022    3:16 PM 04/07/2022    1:58 PM 10/01/2021    9:31 AM  GAD 7 : Generalized Anxiety Score  Nervous, Anxious, on Edge 0 0 0 0  Control/stop worrying 0 0 0 0  Worry too much - different things 0 0 0 0  Trouble relaxing 0 0 0 0  Restless 0 0 0 0  Easily annoyed or irritable 0 0 0 0  Afraid - awful might happen 0 0 0 0  Total GAD 7 Score 0 0 0 0  Anxiety Difficulty Not difficult at all Not difficult at all Not difficult at all Not difficult at all       09/26/2022    9:03 AM 09/05/2022    3:15 PM 04/07/2022    1:57 PM  Depression screen PHQ 2/9  Decreased Interest 0 0 0  Down, Depressed, Hopeless 0 0 0  PHQ - 2 Score 0 0 0  Altered sleeping 0 0 0  Tired, decreased energy 0 0 0  Change in appetite 0 0 0  Feeling bad or failure about yourself  0 0 0  Trouble concentrating 0 0 0  Moving slowly or fidgety/restless 0 0 0  Suicidal thoughts 0 0 0  PHQ-9 Score 0 0 0  Difficult doing work/chores Not difficult at all Not difficult at all Not difficult at all    BP Readings from Last 3 Encounters:  09/26/22 120/78   09/05/22 126/70  07/08/22 132/82    Physical Exam Vitals and nursing note reviewed.  Constitutional:      Appearance: Normal appearance. He is well-groomed and overweight.  HENT:     Head: Normocephalic.     Jaw: There is normal jaw occlusion.     Right Ear: Hearing, tympanic membrane, ear canal and external ear normal.     Left Ear: Hearing, tympanic membrane, ear canal and external ear normal.  Nose: Nose normal.     Mouth/Throat:     Lips: Pink.     Mouth: Mucous membranes are moist.     Tongue: No lesions.     Palate: No mass.     Pharynx: Oropharynx is clear. Uvula midline.     Tonsils: No tonsillar exudate.  Eyes:     General: Lids are normal. Vision grossly intact. Gaze aligned appropriately. No scleral icterus.       Right eye: No discharge.        Left eye: No discharge.     Extraocular Movements: Extraocular movements intact.     Conjunctiva/sclera: Conjunctivae normal.     Right eye: Right conjunctiva is not injected.     Left eye: Left conjunctiva is not injected.     Pupils: Pupils are equal, round, and reactive to light.  Neck:     Thyroid: No thyroid mass, thyromegaly or thyroid tenderness.     Vascular: No carotid bruit, hepatojugular reflux or JVD.     Trachea: Trachea and phonation normal. No tracheal deviation.  Cardiovascular:     Rate and Rhythm: Normal rate and regular rhythm.     Chest Wall: PMI is not displaced.     Pulses:          Carotid pulses are 1+ on the right side and 1+ on the left side.      Radial pulses are 1+ on the right side and 1+ on the left side.       Femoral pulses are 1+ on the right side and 1+ on the left side.      Popliteal pulses are 1+ on the right side and 1+ on the left side.       Dorsalis pedis pulses are 1+ on the right side and 1+ on the left side.       Posterior tibial pulses are 1+ on the right side and 1+ on the left side.     Heart sounds: Normal heart sounds, S1 normal and S2 normal. No murmur heard.     No systolic murmur is present.     No diastolic murmur is present.     No friction rub. No gallop. No S3 or S4 sounds.  Pulmonary:     Effort: No respiratory distress.     Breath sounds: Normal breath sounds. No decreased air movement. No decreased breath sounds, wheezing, rhonchi or rales.  Chest:     Chest wall: No tenderness.  Breasts:    Right: Normal. No mass.     Left: Normal. No mass.  Abdominal:     General: Bowel sounds are normal.     Palpations: Abdomen is soft. There is no hepatomegaly, splenomegaly or mass.     Tenderness: There is no abdominal tenderness. There is no guarding or rebound.  Genitourinary:    Penis: Normal and circumcised.      Testes: Normal.        Right: Mass not present.        Left: Mass not present.     Prostate: Normal. Not enlarged, not tender and no nodules present.     Rectum: Normal. Guaiac result negative. No mass.  Musculoskeletal:        General: No tenderness. Normal range of motion.     Cervical back: Neck supple.     Right lower leg: No edema.     Left lower leg: No edema.  Lymphadenopathy:     Head:  Right side of head: No submental or submandibular adenopathy.     Left side of head: No submental or submandibular adenopathy.     Cervical: No cervical adenopathy.     Right cervical: No superficial, deep or posterior cervical adenopathy.    Left cervical: No superficial, deep or posterior cervical adenopathy.     Upper Body:     Right upper body: No axillary adenopathy.     Left upper body: No axillary adenopathy.     Lower Body: No right inguinal adenopathy. No left inguinal adenopathy.  Skin:    General: Skin is warm.     Capillary Refill: Capillary refill takes less than 2 seconds.     Findings: No rash.  Neurological:     Mental Status: He is alert and oriented to person, place, and time.     Cranial Nerves: Cranial nerves 2-12 are intact. No cranial nerve deficit.     Sensory: Sensation is intact.     Motor: Motor  function is intact.     Coordination: Romberg sign negative.     Deep Tendon Reflexes: Reflexes are normal and symmetric.     Reflex Scores:      Tricep reflexes are 2+ on the right side and 2+ on the left side.      Bicep reflexes are 2+ on the right side and 2+ on the left side.      Brachioradialis reflexes are 2+ on the right side and 2+ on the left side.      Patellar reflexes are 2+ on the right side and 2+ on the left side.      Achilles reflexes are 2+ on the right side and 2+ on the left side. Psychiatric:        Behavior: Behavior is cooperative.     Wt Readings from Last 3 Encounters:  09/26/22 182 lb (82.6 kg)  09/05/22 183 lb (83 kg)  07/08/22 183 lb (83 kg)    BP 120/78   Pulse (!) 56   Ht 5\' 8"  (1.727 m)   Wt 182 lb (82.6 kg)   SpO2 97%   BMI 27.67 kg/m   Assessment and Plan:  Kacen Fossen is a 72 y.o. male who presents today for his Complete Annual Exam. He feels well. He reports exercising yardwork. He reports he is sleeping fairly well.Immunizations are reviewed and recommendations provided.   Age appropriate screening tests are discussed. Counseling given for risk factor reduction interventions.    1. Annual physical exam No subjective/objective concerns noted during HPI, review of past medical history, review of systems, and physical exam.  Obtain PSA for surveillance of prostate concerns.  Hemoccult was negative as well as dipstick urinalysis. - PSA - POCT Occult Blood Stool - POCT urinalysis dipstick  2. Elevated LDL cholesterol level Chronic.  Controlled.  Stable.  Will check lipid panel for current surveillance of LDL maintenance.  In the meantime we have provided low-cholesterol low triglyceride guidelines. - Lipid Panel With LDL/HDL Ratio   Elizabeth Sauer, MD

## 2022-09-26 NOTE — Patient Instructions (Signed)

## 2022-09-27 LAB — LIPID PANEL WITH LDL/HDL RATIO
Cholesterol, Total: 193 mg/dL (ref 100–199)
HDL: 48 mg/dL (ref >39)
LDL Chol Calc (NIH): 128 mg/dL — ABNORMAL HIGH (ref 0–99)
LDL/HDL Ratio: 2.7 ratio (ref 0.0–3.6)
Triglycerides: 93 mg/dL (ref 0–149)
VLDL Cholesterol Cal: 17 mg/dL (ref 5–40)

## 2022-09-27 LAB — PSA: Prostate Specific Ag, Serum: 2.3 ng/mL (ref 0.0–4.0)

## 2022-10-06 ENCOUNTER — Encounter: Payer: Self-pay | Admitting: Family Medicine

## 2022-10-06 ENCOUNTER — Ambulatory Visit (INDEPENDENT_AMBULATORY_CARE_PROVIDER_SITE_OTHER): Payer: Medicare HMO | Admitting: Family Medicine

## 2022-10-06 VITALS — BP 120/64 | HR 56 | Ht 68.0 in | Wt 182.0 lb

## 2022-10-06 DIAGNOSIS — K2100 Gastro-esophageal reflux disease with esophagitis, without bleeding: Secondary | ICD-10-CM | POA: Diagnosis not present

## 2022-10-06 DIAGNOSIS — G250 Essential tremor: Secondary | ICD-10-CM

## 2022-10-06 DIAGNOSIS — I10 Essential (primary) hypertension: Secondary | ICD-10-CM

## 2022-10-06 MED ORDER — METOPROLOL SUCCINATE ER 50 MG PO TB24: ORAL_TABLET | ORAL | 1 refills | Status: DC

## 2022-10-06 MED ORDER — ESOMEPRAZOLE MAGNESIUM 40 MG PO CPDR: 40.0000 mg | DELAYED_RELEASE_CAPSULE | Freq: Every day | ORAL | 1 refills | Status: DC

## 2022-10-06 NOTE — Progress Notes (Signed)
Patient: Derek Figueroa, Male    DOB: March 14, 1951, 72 y.o.   MRN: [id number] Visit Date: 10/06/2022  Today's Provider: Elizabeth Sauer, MD   Chief Complaint  Patient presents with   Gastroesophageal Reflux   Hypertension   Subjective:    ----------------------------------------------------------- Gastroesophageal Reflux He reports no abdominal pain, no chest pain, no choking, no coughing, no dysphagia, no globus sensation, no heartburn, no hoarse voice, no nausea or no wheezing. tremor. This is a chronic problem. The problem has been gradually improving. The symptoms are aggravated by certain foods. Pertinent negatives include no fatigue, melena or weight loss. He has tried a PPI for the symptoms. The treatment provided moderate relief.  Hypertension This is a chronic problem. The problem has been gradually improving since onset. The problem is controlled. Pertinent negatives include no chest pain, headaches, orthopnea, palpitations, peripheral edema or shortness of breath. There are no associated agents to hypertension. There are no known risk factors for coronary artery disease. Past treatments include beta blockers. The current treatment provides moderate improvement. There are no compliance problems.  There is no history of CAD/MI, CVA or retinopathy. There is no history of chronic renal disease, a hypertension causing med or renovascular disease.  Neurologic Problem Primary symptoms comment: tremor. This is a chronic problem. The current episode started more than 1 year ago. The problem has been gradually worsening since onset. There was no focality noted. Pertinent negatives include no abdominal pain, chest pain, fatigue, headaches, nausea, palpitations or shortness of breath. (Worse with activities)    Lab Results  Component Value Date   CREATININE 0.94 04/07/2022   BUN 15 04/07/2022   NA 139 04/07/2022   K 4.5 04/07/2022   CL 103 04/07/2022   CO2 24 04/07/2022   Lab Results   Component Value Date   CHOL 193 09/26/2022   HDL 48 09/26/2022   LDLCALC 128 (H) 09/26/2022   TRIG 93 09/26/2022   CHOLHDL 3.8 08/20/2018   No results found for: "TSH" No results found for: "HGBA1C" Lab Results  Component Value Date   WBC 6.9 09/06/2021   HGB 15.1 09/06/2021   HCT 45.3 09/06/2021   MCV 87 09/06/2021   PLT 253 09/06/2021   Lab Results  Component Value Date   ALT 16 04/07/2022   AST 17 04/07/2022   ALKPHOS 61 04/07/2022   BILITOT 0.3 04/07/2022    Review of Systems  Constitutional:  Negative for fatigue and weight loss.  HENT:  Negative for hoarse voice.   Eyes:  Negative for visual disturbance.  Respiratory:  Negative for cough, choking, chest tightness, shortness of breath and wheezing.   Cardiovascular:  Negative for chest pain, palpitations, orthopnea and leg swelling.  Gastrointestinal:  Negative for abdominal pain, anal bleeding, blood in stool, constipation, dysphagia, heartburn, melena and nausea.  Endocrine: Negative for polydipsia and polyuria.  Genitourinary:  Negative for difficulty urinating, frequency and testicular pain.  Neurological:  Negative for headaches.    Social History   Socioeconomic History   Marital status: Married    Spouse name: Not on file   Number of children: 2   Years of education: Not on file   Highest education level: Not on file  Occupational History   Not on file  Tobacco Use   Smoking status: Former    Types: Cigarettes    Quit date: 09/02/1978    Years since quitting: 44.1   Smokeless tobacco: Never   Tobacco comments:    quit 40 yrs  Vaping Use   Vaping Use: Never used  Substance and Sexual Activity   Alcohol use: Yes    Alcohol/week: 3.0 standard drinks of alcohol    Types: 3 Glasses of wine per week    Comment: occasionally   Drug use: No   Sexual activity: Yes  Other Topics Concern   Not on file  Social History Narrative   Not on file   Social Determinants of Health   Financial Resource  Strain: Low Risk  (10/14/2021)   Overall Financial Resource Strain (CARDIA)    Difficulty of Paying Living Expenses: Not hard at all  Food Insecurity: No Food Insecurity (09/03/2022)   Hunger Vital Sign    Worried About Running Out of Food in the Last Year: Never true    Ran Out of Food in the Last Year: Never true  Transportation Needs: No Transportation Needs (09/03/2022)   PRAPARE - Administrator, Civil Service (Medical): No    Lack of Transportation (Non-Medical): No  Physical Activity: Insufficiently Active (09/03/2022)   Exercise Vital Sign    Days of Exercise per Week: 2 days    Minutes of Exercise per Session: 20 min  Stress: No Stress Concern Present (09/03/2022)   [employer] of Occupational Health - Occupational Stress Questionnaire    Feeling of Stress : Not at all  Social Connections: Unknown (09/03/2022)   Social Connection and Isolation Panel [NHANES]    Frequency of Communication with Friends and Family: More than three times a week    Frequency of Social Gatherings with Friends and Family: Once a week    Attends Religious Services: Patient declined    Database administrator or Organizations: Patient declined    Attends Banker Meetings: Never    Marital Status: Married  Catering manager Violence: Not At Risk (10/14/2021)   Humiliation, Afraid, Rape, and Kick questionnaire    Fear of Current or Ex-Partner: No    Emotionally Abused: No    Physically Abused: No    Sexually Abused: No    Patient Active Problem List   Diagnosis Date Noted   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   Tendinopathy of right rotator cuff 04/09/2022   Personal history of colonic polyps    Polyp of transverse colon    Cholecystitis, acute    Obstruction of bile duct    Jaundice    Abnormal findings on imaging of biliary tract    Residual hemorrhoidal skin tags 06/23/2014   Right shoulder pain 01/05/2014   Gastroesophageal reflux disease without  esophagitis 01/05/2014   Elevated blood pressure 01/05/2014   History of adenomatous polyp of colon 05/26/2006    Past Surgical History:  Procedure Laterality Date   COLONOSCOPY     COLONOSCOPY WITH PROPOFOL N/A 10/11/2019   Procedure: COLONOSCOPY WITH PROPOFOL;  Surgeon: Midge Minium, MD;  Location: [facility] ENDOSCOPY;  Service: Endoscopy;  Laterality: N/A;   ERCP N/A 08/13/2017   Procedure: ENDOSCOPIC RETROGRADE CHOLANGIOPANCREATOGRAPHY (ERCP);  Surgeon: Midge Minium, MD;  Location: [facility] ENDOSCOPY;  Service: Endoscopy;  Laterality: N/A;   EYE SURGERY     gall stones     HERNIA REPAIR     ROBOTIC ASSISTED LAPAROSCOPIC CHOLECYSTECTOMY-MULTI SITE N/A 10/20/2017   Procedure: ROBOTIC ASSISTED LAPAROSCOPIC CHOLECYSTECTOMY-MULTI SITE;  Surgeon: Leafy Ro, MD;  Location: [facility] ORS;  Service: General;  Laterality: N/A;   UMBILICAL HERNIA REPAIR N/A 10/20/2017   Procedure: HERNIA REPAIR UMBILICAL ADULT;  Surgeon: Leafy Ro, MD;  Location: [facility] ORS;  Service: General;  Laterality: N/A;    His family history includes COPD in his brother and father; Cancer in his brother, maternal grandfather, maternal grandmother, and mother; Diabetes in his maternal grandfather and mother; Heart disease in his father, maternal grandfather, maternal grandmother, and mother; Lung cancer in his brother; Stroke in his father, maternal grandmother, and mother.     Current Meds  Medication Sig   esomeprazole (NEXIUM) 40 MG capsule Take 1 capsule (40 mg total) by mouth daily.   latanoprost (XALATAN) 0.005 % ophthalmic solution Apply to eye. Eye doctor   meloxicam (MOBIC) 15 MG tablet Take 1 tablet (15 mg total) by mouth daily.   metoprolol succinate (TOPROL-XL) 50 MG 24 hr tablet TAKE 1 TABLET BY MOUTH ONCE DAILY. TAKE WITH OR IMMEDIATELY FOLLOWING MEAL   Multiple Vitamins-Minerals (MULTIVITAMIN ADULT PO) Take 1 tablet by mouth daily.    Omega-3 Fatty Acids (FISH OIL PO) Take 1 capsule by mouth daily.    Probiotic  Product (PROBIOTIC-10 PO)     Patient Care Team: Duanne Limerick, MD as PCP - General (Family Medicine)       Objective:   Wt Readings from Last 3 Encounters:  10/06/22 182 lb (82.6 kg)  09/26/22 182 lb (82.6 kg)  09/05/22 183 lb (83 kg)    Vitals: BP 120/64   Pulse (!) 56   Ht 5\' 8"  (1.727 m)   Wt 182 lb (82.6 kg)   SpO2 98%   BMI 27.67 kg/m   Physical Exam Vitals and nursing note reviewed.  HENT:     Head: Normocephalic.     Right Ear: Tympanic membrane and external ear normal.     Left Ear: Tympanic membrane and external ear normal.     Nose: Nose normal.     Mouth/Throat:     Mouth: Mucous membranes are moist.     Pharynx: No oropharyngeal exudate or posterior oropharyngeal erythema.  Eyes:     General: No scleral icterus.       Right eye: No discharge.        Left eye: No discharge.     Conjunctiva/sclera: Conjunctivae normal.     Pupils: Pupils are equal, round, and reactive to light.  Neck:     Thyroid: No thyromegaly.     Vascular: No JVD.     Trachea: No tracheal deviation.  Cardiovascular:     Rate and Rhythm: Normal rate and regular rhythm.     Heart sounds: Normal heart sounds. No murmur heard.    No friction rub. No gallop.  Pulmonary:     Effort: No respiratory distress.     Breath sounds: Normal breath sounds. No wheezing, rhonchi or rales.  Chest:     Chest wall: No tenderness.  Abdominal:     General: Bowel sounds are normal.     Palpations: Abdomen is soft. There is no mass.     Tenderness: There is no abdominal tenderness. There is no guarding or rebound.  Musculoskeletal:        General: No tenderness. Normal range of motion.     Cervical back: Normal range of motion and neck supple.  Lymphadenopathy:     Cervical: No cervical adenopathy.  Skin:    General: Skin is warm.     Findings: No bruising, erythema or rash.  Neurological:     Mental Status: He is alert and oriented to person, place, and time.     Cranial Nerves: No cranial  nerve deficit.     Motor: Tremor present.     Deep Tendon Reflexes: Reflexes are normal and symmetric.     Comments: Fine/bilateral     Activities of Daily Living    10/14/2021    8:33 AM  In your present state of health, do you have any difficulty performing the following activities:  Hearing? 1  Vision? 0  Difficulty concentrating or making decisions? 0  Walking or climbing stairs? 0  Dressing or bathing? 0  Doing errands, shopping? 0  Preparing Food and eating ? N  Using the Toilet? N  In the past six months, have you accidently leaked urine? N  Do you have problems with loss of bowel control? N  Managing your Medications? N  Managing your Finances? N  Housekeeping or managing your Housekeeping? N    Fall Risk Assessment    10/06/2022   10:30 AM 09/26/2022    9:03 AM 09/05/2022    3:15 PM 04/07/2022    1:57 PM 10/14/2021    8:33 AM  Fall Risk   Falls in the past year? 0 0 0 0 0  Number falls in past yr: 0 0 0 0 0  Injury with Fall? 0 0 0 0 0  Risk for fall due to : No Fall Risks No Fall Risks No Fall Risks No Fall Risks No Fall Risks  Follow up Falls evaluation completed Falls evaluation completed Falls evaluation completed Falls evaluation completed Falls prevention discussed     Depression Screen    10/06/2022   10:30 AM 09/26/2022    9:03 AM 09/05/2022    3:15 PM 04/07/2022    1:57 PM  PHQ 2/9 Scores  PHQ - 2 Score 0 0 0 0  PHQ- 9 Score 0 0 0 0       08/29/2020    9:42 AM  6CIT Screen  What Year? 0 points  What month? 0 points  What time? 0 points  Count back from 20 0 points  Months in reverse 0 points  Repeat phrase 0 points  Total Score 0 points    Medicare Annual Wellness Visit Summary:  Reviewed patient's Family Medical History Reviewed and updated list of patient's medical providers Assessment of cognitive impairment was done Assessed patient's functional ability Established a written schedule for health screening services Health Risk  Assessent Completed and Reviewed  Exercise Activities and Dietary recommendations  Goals      Weight (lb) < 170 lb (77.1 kg)     Pt would like to lose weight over the next year with healthy eating and physical activity         Immunization History  Administered Date(s) Administered   Hepatitis A, Adult 04/01/1997, 04/28/1998   Influenza Whole 02/24/1996, 03/27/1996, 03/31/1997, 04/01/1998, 04/27/1999, 03/28/2000, 04/11/2001, 04/11/2002, 06/03/2003   Influenza,inj,Quad PF,6+ Mos 03/16/2014   MMR 06/06/2000   Meningococcal polysaccharide vaccine (MPSV4) 04/28/1998   Moderna Sars-Covid-2 Vaccination 09/27/2019, 10/25/2019   OPV 06/26/1972, 06/28/1972   PPD Test 07/01/1998, 02/27/2001, 02/18/2002, 02/05/2003   Pneumococcal Conjugate-13 07/06/2018   Td 08/24/1992, 09/01/1992, 06/04/2002   Tdap 06/20/2013   Typhoid Inactivated 02/27/2001, 03/11/2003   Typhoid Parenteral 07/01/1998   Typhoid Parenteral, AKD ([place] [employer]) 07/25/1995, 07/27/1995   Yellow Fever 06/26/1993, 07/06/1993, 06/01/1999    Health Maintenance  Topic Date Due   Medicare Annual Wellness (AWV)  10/15/2022   Zoster Vaccines- Shingrix (1 of 2) 12/05/2022 (Originally 01/14/1970)   Pneumonia Vaccine 32+ Years old (2 of 2 - PPSV23  or PCV20) 04/08/2023 (Originally 07/07/2019)   INFLUENZA VACCINE  12/25/2022   DTaP/Tdap/Td (5 - Td or Tdap) 06/21/2023   COLONOSCOPY (Pts 45-48yrs Insurance coverage will need to be confirmed)  10/10/2024   Hepatitis C Screening  Completed   HPV VACCINES  Aged Out   COVID-19 Vaccine  Discontinued      ------------------------------------------------------------------------------------------------------------  Assessment & Plan:    1. Essential hypertension Chronic.  Controlled.  Stable.  Blood pressure 120/64.  Asymptomatic.  Tolerating medication well.  Continue metoprolol XL 50 mg once a day.  Will check renal function panel for GFR. - metoprolol succinate (TOPROL-XL) 50 MG 24 hr  tablet; TAKE 1 TABLET BY MOUTH ONCE DAILY. TAKE WITH OR IMMEDIATELY FOLLOWING MEAL  Dispense: 90 tablet; Refill: 1 - Renal Function Panel  2. Gastroesophageal reflux disease with esophagitis without hemorrhage Chronic.  Controlled.  Stable.  Continue Nexium 40 mg once a day. - esomeprazole (NEXIUM) 40 MG capsule; Take 1 capsule (40 mg total) by mouth daily.  Dispense: 90 capsule; Refill: 1  3. Essential tremor Chronic.  Uncontrolled.  Relatively stable that is a fine tremor but is now becoming more of an issue with eating peas with a 4.  Encouraged primarily with activity and we will refer to neurology for evaluation and maximizing treatment since it is not under control at current dosing of metoprolol. - metoprolol succinate (TOPROL-XL) 50 MG 24 hr tablet; TAKE 1 TABLET BY MOUTH ONCE DAILY. TAKE WITH OR IMMEDIATELY FOLLOWING MEAL  Dispense: 90 tablet; Refill: 1 - Ambulatory referral to Neurology

## 2022-10-07 ENCOUNTER — Encounter: Payer: Self-pay | Admitting: Family Medicine

## 2022-10-07 LAB — RENAL FUNCTION PANEL
Albumin: 4 g/dL (ref 3.9–4.9)
BUN/Creatinine Ratio: 18 (ref 10–24)
BUN: 16 mg/dL (ref 8–27)
CO2: 22 mmol/L (ref 20–29)
Calcium: 9 mg/dL (ref 8.6–10.2)
Chloride: 105 mmol/L (ref 96–106)
Creatinine, Ser: 0.91 mg/dL (ref 0.76–1.27)
Glucose: 93 mg/dL (ref 70–99)
Phosphorus: 2.9 mg/dL (ref 2.8–4.1)
Potassium: 4.5 mmol/L (ref 3.5–5.2)
Sodium: 141 mmol/L (ref 134–144)
eGFR: 91 mL/min/{1.73_m2} (ref >59)

## 2022-10-07 NOTE — Progress Notes (Signed)
PC to pt, discussed labs, pt voiced understanding.

## 2022-10-09 ENCOUNTER — Telehealth: Payer: Self-pay | Admitting: Family Medicine

## 2022-10-09 NOTE — Telephone Encounter (Signed)
Contacted Derek Figueroa to schedule their annual wellness visit. Appointment made for 10/30/2022.  Verlee Rossetti; Care Guide Ambulatory Clinical Support [facility] Direct Dial: [phone]

## 2022-10-09 NOTE — Telephone Encounter (Signed)
Copied from CRM [id number]. Topic: Medicare AWV >> Oct 09, 2022  9:52 AM Payton Doughty wrote: Reason for CRM: Called patient to schedule Medicare Annual Wellness Visit (AWV). Left message for patient to call back and schedule Medicare Annual Wellness Visit (AWV).  Last date of AWV: 10/14/21  Please schedule an appointment at any time with Kennedy Bucker, LPN  .  If any questions, please contact me.  Thank you ,  Verlee Rossetti; Care Guide Ambulatory Clinical Support [facility] Direct Dial: [phone]

## 2022-10-13 ENCOUNTER — Encounter: Payer: Self-pay | Admitting: Family Medicine

## 2022-10-29 ENCOUNTER — Other Ambulatory Visit: Payer: Self-pay | Admitting: Family Medicine

## 2022-10-29 DIAGNOSIS — M25559 Pain in unspecified hip: Secondary | ICD-10-CM

## 2022-10-29 NOTE — Telephone Encounter (Signed)
Requested Prescriptions  Pending Prescriptions Disp Refills   meloxicam (MOBIC) 15 MG tablet [Pharmacy Med Name: Meloxicam 15 MG Oral Tablet] 90 tablet 0    Sig: Take 1 tablet by mouth once daily     Analgesics:  COX2 Inhibitors Failed - 10/29/2022 11:40 AM      Failed - Manual Review: Labs are only required if the patient has taken medication for more than 8 weeks.      Failed - HGB in normal range and within 360 days    Hemoglobin  Date Value Ref Range Status  09/06/2021 15.1 13.0 - 17.7 g/dL Final         Failed - HCT in normal range and within 360 days    Hematocrit  Date Value Ref Range Status  09/06/2021 45.3 37.5 - 51.0 % Final         Passed - Cr in normal range and within 360 days    Creatinine  Date Value Ref Range Status  12/17/2013 0.93 0.60 - 1.30 mg/dL Final   Creatinine, Ser  Date Value Ref Range Status  10/06/2022 0.91 0.76 - 1.27 mg/dL Final         Passed - AST in normal range and within 360 days    AST  Date Value Ref Range Status  04/07/2022 17 0 - 40 IU/L Final   SGOT(AST)  Date Value Ref Range Status  12/17/2013 15 15 - 37 Unit/L Final         Passed - ALT in normal range and within 360 days    ALT  Date Value Ref Range Status  04/07/2022 16 0 - 44 IU/L Final   SGPT (ALT)  Date Value Ref Range Status  12/17/2013 29 U/L Final    Comment:    14-63 NOTE: New Reference Range 12/13/13          Passed - eGFR is 30 or above and within 360 days    EGFR (African American)  Date Value Ref Range Status  12/17/2013 >60  Final   GFR calc Af Amer  Date Value Ref Range Status  08/30/2019 101 >59 mL/min/1.73 Final   EGFR (Non-African Amer.)  Date Value Ref Range Status  12/17/2013 >60  Final    Comment:    eGFR values <73mL/min/1.73 m2 may be an indication of chronic kidney disease (CKD). Calculated eGFR is useful in patients with stable renal function. The eGFR calculation will not be reliable in acutely ill patients when serum creatinine  is changing rapidly. It is not useful in  patients on dialysis. The eGFR calculation may not be applicable to patients at the low and high extremes of body sizes, pregnant women, and vegetarians.    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   [facility] at [facility], MD   3 months ago Tendinopathy of left rotator cuff   [place] [facility] at [facility], Ocie Bob, MD   5 months ago Tendinopathy of left rotator cuff   [facility] at [facility], Ocie Bob, MD       Future Appointments             In 5 months Duanne Limerick, MD [facility] at [facility], [facility]

## 2022-10-30 ENCOUNTER — Ambulatory Visit (INDEPENDENT_AMBULATORY_CARE_PROVIDER_SITE_OTHER): Payer: Medicare HMO

## 2022-10-30 VITALS — Ht 68.0 in | Wt 182.0 lb

## 2022-10-30 DIAGNOSIS — Z0000 Encounter for general adult medical examination without abnormal findings: Secondary | ICD-10-CM | POA: Diagnosis not present

## 2022-10-30 NOTE — Progress Notes (Signed)
I connected with  Derek Figueroa on 10/30/22 by a audio enabled telemedicine application and verified that I am speaking with the correct person using two identifiers.  Patient Location: Home  Provider Location: Office/Clinic  I discussed the limitations of evaluation and management by telemedicine. The patient expressed understanding and agreed to proceed.  Subjective:   Derek Figueroa is a 72 y.o. male who presents for Medicare Annual/Subsequent preventive examination.  Review of Systems     Cardiac Risk Factors include: advanced age (>24men, >49 women);male gender;hypertension     Objective:    Today's Vitals   10/30/22 0911  Weight: 182 lb (82.6 kg)  Height: 5\' 8"  (1.727 m)   Body mass index is 27.67 kg/m.     10/30/2022    9:03 AM 10/14/2021    8:32 AM 08/29/2020    9:38 AM 10/11/2019    7:45 AM 08/24/2019    9:39 AM 08/13/2017    9:36 AM 08/05/2017    9:14 AM  Advanced Directives  Does Patient Have a Medical Advance Directive? No Yes Yes Yes No No No  Type of Special educational needs teacher of [place];Living will Healthcare Power of [place];Living will Healthcare Power of [place];Living will     Copy of Healthcare Power of Attorney in Chart?  No - copy requested No - copy requested No - copy requested     Would patient like information on creating a medical advance directive? No - Patient declined    No - Patient declined No - Patient declined No - Patient declined    Current Medications (verified) Outpatient Encounter Medications as of 10/30/2022  Medication Sig   esomeprazole (NEXIUM) 40 MG capsule Take 1 capsule (40 mg total) by mouth daily.   latanoprost (XALATAN) 0.005 % ophthalmic solution Apply to eye. Eye doctor   meloxicam (MOBIC) 15 MG tablet Take 1 tablet by mouth once daily   metoprolol succinate (TOPROL-XL) 50 MG 24 hr tablet TAKE 1 TABLET BY MOUTH ONCE DAILY. TAKE WITH OR IMMEDIATELY FOLLOWING MEAL   Multiple Vitamins-Minerals (MULTIVITAMIN  ADULT PO) Take 1 tablet by mouth daily.    Omega-3 Fatty Acids (FISH OIL PO) Take 1 capsule by mouth daily.    Probiotic Product (PROBIOTIC-10 PO)    No facility-administered encounter medications on file as of 10/30/2022.    Allergies (verified) Patient has no known allergies.   History: Past Medical History:  Diagnosis Date   Heart murmur    Hypertension    Past Surgical History:  Procedure Laterality Date   COLONOSCOPY     COLONOSCOPY WITH PROPOFOL N/A 10/11/2019   Procedure: COLONOSCOPY WITH PROPOFOL;  Surgeon: Midge Minium, MD;  Location: [facility] ENDOSCOPY;  Service: Endoscopy;  Laterality: N/A;   ERCP N/A 08/13/2017   Procedure: ENDOSCOPIC RETROGRADE CHOLANGIOPANCREATOGRAPHY (ERCP);  Surgeon: Midge Minium, MD;  Location: [facility] ENDOSCOPY;  Service: Endoscopy;  Laterality: N/A;   EYE SURGERY     gall stones     HERNIA REPAIR     ROBOTIC ASSISTED LAPAROSCOPIC CHOLECYSTECTOMY-MULTI SITE N/A 10/20/2017   Procedure: ROBOTIC ASSISTED LAPAROSCOPIC CHOLECYSTECTOMY-MULTI SITE;  Surgeon: Leafy Ro, MD;  Location: [facility] ORS;  Service: General;  Laterality: N/A;   UMBILICAL HERNIA REPAIR N/A 10/20/2017   Procedure: HERNIA REPAIR UMBILICAL ADULT;  Surgeon: Leafy Ro, MD;  Location: [facility] ORS;  Service: General;  Laterality: N/A;   Family History  Problem Relation Age of Onset   Heart disease Mother    Diabetes Mother    Stroke  Mother    Cancer Mother    Heart disease Father    COPD Father    Stroke Father    Lung cancer Brother    COPD Brother    Cancer Brother    Cancer Maternal Grandmother    Heart disease Maternal Grandmother    Stroke Maternal Grandmother    Cancer Maternal Grandfather    Diabetes Maternal Grandfather    Heart disease Maternal Grandfather    Social History   Socioeconomic History   Marital status: Married    Spouse name: Not on file   Number of children: 2   Years of education: Not on file   Highest education level: Not on file  Occupational  History   Not on file  Tobacco Use   Smoking status: Former    Types: Cigarettes    Quit date: 09/02/1978    Years since quitting: 44.1   Smokeless tobacco: Never   Tobacco comments:    quit 40 yrs  Vaping Use   Vaping Use: Never used  Substance and Sexual Activity   Alcohol use: Yes    Alcohol/week: 3.0 standard drinks of alcohol    Types: 3 Glasses of wine per week    Comment: occasionally   Drug use: No   Sexual activity: Yes  Other Topics Concern   Not on file  Social History Narrative   Not on file   Social Determinants of Health   Financial Resource Strain: Low Risk  (10/30/2022)   Overall Financial Resource Strain (CARDIA)    Difficulty of Paying Living Expenses: Not hard at all  Food Insecurity: No Food Insecurity (10/30/2022)   Hunger Vital Sign    Worried About Running Out of Food in the Last Year: Never true    Ran Out of Food in the Last Year: Never true  Transportation Needs: No Transportation Needs (10/30/2022)   PRAPARE - Administrator, Civil Service (Medical): No    Lack of Transportation (Non-Medical): No  Physical Activity: Sufficiently Active (10/30/2022)   Exercise Vital Sign    Days of Exercise per Week: 3 days    Minutes of Exercise per Session: 60 min  Recent Concern: Physical Activity - Insufficiently Active (09/03/2022)   Exercise Vital Sign    Days of Exercise per Week: 2 days    Minutes of Exercise per Session: 20 min  Stress: No Stress Concern Present (10/30/2022)   [employer] of Occupational Health - Occupational Stress Questionnaire    Feeling of Stress : Not at all  Social Connections: Moderately Integrated (10/30/2022)   Social Connection and Isolation Panel [NHANES]    Frequency of Communication with Friends and Family: More than three times a week    Frequency of Social Gatherings with Friends and Family: Once a week    Attends Religious Services: Never    Database administrator or Organizations: Yes    Attends Museum/gallery exhibitions officer: More than 4 times per year    Marital Status: Married    Tobacco Counseling Counseling given: Not Answered Tobacco comments: quit 40 yrs   Clinical Intake:  Pre-visit preparation completed: Yes  Pain : No/denies pain     Nutritional Risks: None Diabetes: No  How often do you need to have someone help you when you read instructions, pamphlets, or other written materials from your doctor or pharmacy?: 1 - Never  Diabetic?no  Interpreter Needed?: No  Information entered by :: Kennedy Bucker, LPN   Activities  of Daily Living    10/30/2022    9:04 AM  In your present state of health, do you have any difficulty performing the following activities:  Hearing? 0  Vision? 0  Difficulty concentrating or making decisions? 0  Walking or climbing stairs? 0  Dressing or bathing? 0  Doing errands, shopping? 0  Preparing Food and eating ? N  Using the Toilet? N  In the past six months, have you accidently leaked urine? N  Do you have problems with loss of bowel control? N  Managing your Medications? N  Managing your Finances? N  Housekeeping or managing your Housekeeping? N    Patient Care Team: Duanne Limerick, MD as PCP - General (Family Medicine)  Indicate any recent Medical Services you may have received from other than [facility] providers in the past year (date may be approximate).     Assessment:   This is a routine wellness examination for Shanne.  Hearing/Vision screen Hearing Screening - Comments:: Wears aids  Vision Screening - Comments:: Wears glasses- Dr.Woodard  Dietary issues and exercise activities discussed: Current Exercise Habits: Home exercise routine, Type of exercise: Other - see comments (yard work), Time (Minutes): 60, Frequency (Times/Week): 3, Weekly Exercise (Minutes/Week): 180, Intensity: Mild   Goals Addressed             This Visit's Progress    DIET - EAT MORE FRUITS AND VEGETABLES         Depression Screen     10/30/2022    9:02 AM 10/06/2022   10:30 AM 09/26/2022    9:03 AM 09/05/2022    3:15 PM 04/07/2022    1:57 PM 10/14/2021    8:31 AM 10/01/2021    9:31 AM  PHQ 2/9 Scores  PHQ - 2 Score 0 0 0 0 0 0 0  PHQ- 9 Score 0 0 0 0 0  0    Fall Risk    10/30/2022    9:04 AM 10/06/2022   10:30 AM 09/26/2022    9:03 AM 09/05/2022    3:15 PM 04/07/2022    1:57 PM  Fall Risk   Falls in the past year? 0 0 0 0 0  Number falls in past yr: 0 0 0 0 0  Injury with Fall? 0 0 0 0 0  Risk for fall due to : No Fall Risks No Fall Risks No Fall Risks No Fall Risks No Fall Risks  Follow up Falls prevention discussed;Falls evaluation completed Falls evaluation completed Falls evaluation completed Falls evaluation completed Falls evaluation completed    FALL RISK PREVENTION PERTAINING TO THE HOME:  Any stairs in or around the home? Yes  If so, are there any without handrails? No  Home free of loose throw rugs in walkways, pet beds, electrical cords, etc? Yes  Adequate lighting in your home to reduce risk of falls? Yes   ASSISTIVE DEVICES UTILIZED TO PREVENT FALLS:  Life alert? No  Use of a cane, walker or w/c? No  Grab bars in the bathroom? Yes  Shower chair or bench in shower? No  Elevated toilet seat or a handicapped toilet? Yes    Cognitive Function:        10/30/2022    9:07 AM 08/29/2020    9:42 AM  6CIT Screen  What Year? 0 points 0 points  What month? 0 points 0 points  What time? 0 points 0 points  Count back from 20 0 points 0 points  Months in reverse  0 points 0 points  Repeat phrase 0 points 0 points  Total Score 0 points 0 points    Immunizations Immunization History  Administered Date(s) Administered   Hepatitis A, Adult 04/01/1997, 04/28/1998   Influenza Whole 02/24/1996, 03/27/1996, 03/31/1997, 04/01/1998, 04/27/1999, 03/28/2000, 04/11/2001, 04/11/2002, 06/03/2003   Influenza,inj,Quad PF,6+ Mos 03/16/2014   MMR 06/06/2000   Meningococcal polysaccharide vaccine (MPSV4) 04/28/1998    Moderna Sars-Covid-2 Vaccination 09/27/2019, 10/25/2019   OPV 06/26/1972, 06/28/1972   PPD Test 07/01/1998, 02/27/2001, 02/18/2002, 02/05/2003   Pneumococcal Conjugate-13 07/06/2018   Td 08/24/1992, 09/01/1992, 06/04/2002   Tdap 06/20/2013   Typhoid Inactivated 02/27/2001, 03/11/2003   Typhoid Parenteral 07/01/1998   Typhoid Parenteral, AKD ([place] [employer]) 07/25/1995, 07/27/1995   Yellow Fever 06/26/1993, 07/06/1993, 06/01/1999    TDAP status: Up to date  Flu Vaccine status: Declined, Education has been provided regarding the importance of this vaccine but patient still declined. Advised may receive this vaccine at local pharmacy or Health Dept. Aware to provide a copy of the vaccination record if obtained from local pharmacy or Health Dept. Verbalized acceptance and understanding.  Pneumococcal vaccine status: Up to date  Covid-19 vaccine status: Completed vaccines  Qualifies for Shingles Vaccine? Yes   Zostavax completed No   Shingrix Completed?: Yes  Screening Tests Health Maintenance  Topic Date Due   Zoster Vaccines- Shingrix (1 of 2) 12/05/2022 (Originally 01/14/1970)   Pneumonia Vaccine 34+ Years old (2 of 2 - PPSV23 or PCV20) 04/08/2023 (Originally 07/07/2019)   INFLUENZA VACCINE  12/25/2022   DTaP/Tdap/Td (5 - Td or Tdap) 06/21/2023   Medicare Annual Wellness (AWV)  10/30/2023   Colonoscopy  10/10/2024   Hepatitis C Screening  Completed   HPV VACCINES  Aged Out   COVID-19 Vaccine  Discontinued    Health Maintenance  There are no preventive care reminders to display for this patient.   Colorectal cancer screening: Type of screening: Colonoscopy. Completed 10/11/19. Repeat every 5 years  Lung Cancer Screening: (Low Dose CT Chest recommended if Age 60-80 years, 30 pack-year currently smoking OR have quit w/in 15years.) does not qualify.    Additional Screening:  Hepatitis C Screening: does qualify; Completed 09/23/13  Vision Screening: Recommended annual  ophthalmology exams for early detection of glaucoma and other disorders of the eye. Is the patient up to date with their annual eye exam?  Yes  Who is the provider or what is the name of the office in which the patient attends annual eye exams? Dr.Woodard If pt is not established with a provider, would they like to be referred to a provider to establish care? No .   Dental Screening: Recommended annual dental exams for proper oral hygiene  Community Resource Referral / Chronic Care Management: CRR required this visit?  No   CCM required this visit?  No      Plan:     I have personally reviewed and noted the following in the patient's chart:   Medical and social history Use of alcohol, tobacco or illicit drugs  Current medications and supplements including opioid prescriptions. Patient is not currently taking opioid prescriptions. Functional ability and status Nutritional status Physical activity Advanced directives List of other physicians Hospitalizations, surgeries, and ER visits in previous 12 months Vitals Screenings to include cognitive, depression, and falls Referrals and appointments  In addition, I have reviewed and discussed with patient certain preventive protocols, quality metrics, and best practice recommendations. A written personalized care plan for preventive services as well as general preventive health recommendations were  provided to patient.     Hal Hope, LPN   05/31/1094   Nurse Notes: none

## 2022-10-30 NOTE — Patient Instructions (Signed)
Derek Figueroa , Thank you for taking time to come for your Medicare Wellness Visit. I appreciate your ongoing commitment to your health goals. Please review the following plan we discussed and let me know if I can assist you in the future.   These are the goals we discussed:  Goals      DIET - EAT MORE FRUITS AND VEGETABLES     Weight (lb) < 170 lb (77.1 kg)     Pt would like to lose weight over the next year with healthy eating and physical activity         This is a list of the screening recommended for you and due dates:  Health Maintenance  Topic Date Due   Zoster (Shingles) Vaccine (1 of 2) 12/05/2022*   Pneumonia Vaccine (2 of 2 - PPSV23 or PCV20) 04/08/2023*   Flu Shot  12/25/2022   DTaP/Tdap/Td vaccine (5 - Td or Tdap) 06/21/2023   Medicare Annual Wellness Visit  10/30/2023   Colon Cancer Screening  10/10/2024   Hepatitis C Screening  Completed   HPV Vaccine  Aged Out   COVID-19 Vaccine  Discontinued  *Topic was postponed. The date shown is not the original due date.    Advanced directives: no  Conditions/risks identified: none  Next appointment: Follow up in one year for your annual wellness visit. 11/04/23 @ 3:30 pm by phone  Preventive Care 70 Years and Older, Male  Preventive care refers to lifestyle choices and visits with your health care provider that can promote health and wellness. What does preventive care include? A yearly physical exam. This is also called an annual well check. Dental exams once or twice a year. Routine eye exams. Ask your health care provider how often you should have your eyes checked. Personal lifestyle choices, including: Daily care of your teeth and gums. Regular physical activity. Eating a healthy diet. Avoiding tobacco and drug use. Limiting alcohol use. Practicing safe sex. Taking low doses of aspirin every day. Taking vitamin and mineral supplements as recommended by your health care provider. What happens during an annual  well check? The services and screenings done by your health care provider during your annual well check will depend on your age, overall health, lifestyle risk factors, and family history of disease. Counseling  Your health care provider may ask you questions about your: Alcohol use. Tobacco use. Drug use. Emotional well-being. Home and relationship well-being. Sexual activity. Eating habits. History of falls. Memory and ability to understand (cognition). Work and work Astronomer. Screening  You may have the following tests or measurements: Height, weight, and BMI. Blood pressure. Lipid and cholesterol levels. These may be checked every 5 years, or more frequently if you are over 70 years old. Skin check. Lung cancer screening. You may have this screening every year starting at age 70 if you have a 30-pack-year history of smoking and currently smoke or have quit within the past 15 years. Fecal occult blood test (FOBT) of the stool. You may have this test every year starting at age 70. Flexible sigmoidoscopy or colonoscopy. You may have a sigmoidoscopy every 5 years or a colonoscopy every 10 years starting at age 70. Prostate cancer screening. Recommendations will vary depending on your family history and other risks. Hepatitis C blood test. Hepatitis B blood test. Sexually transmitted disease (STD) testing. Diabetes screening. This is done by checking your blood sugar (glucose) after you have not eaten for a while (fasting). You may have this done every 1-3  years. Abdominal aortic aneurysm (AAA) screening. You may need this if you are a current or former smoker. Osteoporosis. You may be screened starting at age 70 if you are at high risk. Talk with your health care provider about your test results, treatment options, and if necessary, the need for more tests. Vaccines  Your health care provider may recommend certain vaccines, such as: Influenza vaccine. This is recommended every  year. Tetanus, diphtheria, and acellular pertussis (Tdap, Td) vaccine. You may need a Td booster every 10 years. Zoster vaccine. You may need this after age 70. Pneumococcal 13-valent conjugate (PCV13) vaccine. One dose is recommended after age 70. Pneumococcal polysaccharide (PPSV23) vaccine. One dose is recommended after age 70. Talk to your health care provider about which screenings and vaccines you need and how often you need them. This information is not intended to replace advice given to you by your health care provider. Make sure you discuss any questions you have with your health care provider. Document Released: 06/08/2015 Document Revised: 01/30/2016 Document Reviewed: 03/13/2015 Elsevier Interactive Patient Education  2017 [employer].  Fall Prevention in the Home Falls can cause injuries. They can happen to people of all ages. There are many things you can do to make your home safe and to help prevent falls. What can I do on the outside of my home? Regularly fix the edges of walkways and driveways and fix any cracks. Remove anything that might make you trip as you walk through a door, such as a raised step or threshold. Trim any bushes or trees on the path to your home. Use bright outdoor lighting. Clear any walking paths of anything that might make someone trip, such as rocks or tools. Regularly check to see if handrails are loose or broken. Make sure that both sides of any steps have handrails. Any raised decks and porches should have guardrails on the edges. Have any leaves, snow, or ice cleared regularly. Use sand or salt on walking paths during winter. Clean up any spills in your garage right away. This includes oil or grease spills. What can I do in the bathroom? Use night lights. Install grab bars by the toilet and in the tub and shower. Do not use towel bars as grab bars. Use non-skid mats or decals in the tub or shower. If you need to sit down in the shower, use a  plastic, non-slip stool. Keep the floor dry. Clean up any water that spills on the floor as soon as it happens. Remove soap buildup in the tub or shower regularly. Attach bath mats securely with double-sided non-slip rug tape. Do not have throw rugs and other things on the floor that can make you trip. What can I do in the bedroom? Use night lights. Make sure that you have a light by your bed that is easy to reach. Do not use any sheets or blankets that are too big for your bed. They should not hang down onto the floor. Have a firm chair that has side arms. You can use this for support while you get dressed. Do not have throw rugs and other things on the floor that can make you trip. What can I do in the kitchen? Clean up any spills right away. Avoid walking on wet floors. Keep items that you use a lot in easy-to-reach places. If you need to reach something above you, use a strong step stool that has a grab bar. Keep electrical cords out of the way. Do  not use floor polish or wax that makes floors slippery. If you must use wax, use non-skid floor wax. Do not have throw rugs and other things on the floor that can make you trip. What can I do with my stairs? Do not leave any items on the stairs. Make sure that there are handrails on both sides of the stairs and use them. Fix handrails that are broken or loose. Make sure that handrails are as long as the stairways. Check any carpeting to make sure that it is firmly attached to the stairs. Fix any carpet that is loose or worn. Avoid having throw rugs at the top or bottom of the stairs. If you do have throw rugs, attach them to the floor with carpet tape. Make sure that you have a light switch at the top of the stairs and the bottom of the stairs. If you do not have them, ask someone to add them for you. What else can I do to help prevent falls? Wear shoes that: Do not have high heels. Have rubber bottoms. Are comfortable and fit you  well. Are closed at the toe. Do not wear sandals. If you use a stepladder: Make sure that it is fully opened. Do not climb a closed stepladder. Make sure that both sides of the stepladder are locked into place. Ask someone to hold it for you, if possible. Clearly mark and make sure that you can see: Any grab bars or handrails. First and last steps. Where the edge of each step is. Use tools that help you move around (mobility aids) if they are needed. These include: Canes. Walkers. Scooters. Crutches. Turn on the lights when you go into a dark area. Replace any light bulbs as soon as they burn out. Set up your furniture so you have a clear path. Avoid moving your furniture around. If any of your floors are uneven, fix them. If there are any pets around you, be aware of where they are. Review your medicines with your doctor. Some medicines can make you feel dizzy. This can increase your chance of falling. Ask your doctor what other things that you can do to help prevent falls. This information is not intended to replace advice given to you by your health care provider. Make sure you discuss any questions you have with your health care provider. Document Released: 03/08/2009 Document Revised: 10/18/2015 Document Reviewed: 06/16/2014 Elsevier Interactive Patient Education  2017 [employer].

## 2022-11-06 DIAGNOSIS — H9312 Tinnitus, left ear: Secondary | ICD-10-CM | POA: Diagnosis not present

## 2022-11-06 DIAGNOSIS — H9193 Unspecified hearing loss, bilateral: Secondary | ICD-10-CM | POA: Diagnosis not present

## 2022-11-06 DIAGNOSIS — G250 Essential tremor: Secondary | ICD-10-CM | POA: Diagnosis not present

## 2022-11-06 DIAGNOSIS — Z7689 Persons encountering health services in other specified circumstances: Secondary | ICD-10-CM | POA: Diagnosis not present

## 2022-11-23 ENCOUNTER — Encounter: Payer: Self-pay | Admitting: Family Medicine

## 2022-12-09 DIAGNOSIS — H9193 Unspecified hearing loss, bilateral: Secondary | ICD-10-CM | POA: Diagnosis not present

## 2022-12-09 DIAGNOSIS — H9312 Tinnitus, left ear: Secondary | ICD-10-CM | POA: Diagnosis not present

## 2022-12-09 DIAGNOSIS — G250 Essential tremor: Secondary | ICD-10-CM | POA: Diagnosis not present

## 2022-12-11 DIAGNOSIS — H25813 Combined forms of age-related cataract, bilateral: Secondary | ICD-10-CM | POA: Diagnosis not present

## 2022-12-11 DIAGNOSIS — H43822 Vitreomacular adhesion, left eye: Secondary | ICD-10-CM | POA: Diagnosis not present

## 2022-12-11 DIAGNOSIS — H40023 Open angle with borderline findings, high risk, bilateral: Secondary | ICD-10-CM | POA: Diagnosis not present

## 2022-12-11 DIAGNOSIS — H35372 Puckering of macula, left eye: Secondary | ICD-10-CM | POA: Diagnosis not present

## 2022-12-11 DIAGNOSIS — Z0100 Encounter for examination of eyes and vision without abnormal findings: Secondary | ICD-10-CM | POA: Diagnosis not present

## 2023-01-13 DIAGNOSIS — L600 Ingrowing nail: Secondary | ICD-10-CM | POA: Diagnosis not present

## 2023-01-13 DIAGNOSIS — M79675 Pain in left toe(s): Secondary | ICD-10-CM | POA: Diagnosis not present

## 2023-02-03 DIAGNOSIS — M79675 Pain in left toe(s): Secondary | ICD-10-CM | POA: Diagnosis not present

## 2023-02-03 DIAGNOSIS — L600 Ingrowing nail: Secondary | ICD-10-CM | POA: Diagnosis not present

## 2023-02-05 ENCOUNTER — Other Ambulatory Visit: Payer: Self-pay | Admitting: Family Medicine

## 2023-02-05 DIAGNOSIS — M25559 Pain in unspecified hip: Secondary | ICD-10-CM

## 2023-02-05 MED ORDER — MELOXICAM 15 MG PO TABS: 15.0000 mg | ORAL_TABLET | Freq: Every day | ORAL | 0 refills | Status: DC

## 2023-02-05 NOTE — Telephone Encounter (Signed)
Request was refilled 02/05/23, duplicate request.  Requested Prescriptions  Pending Prescriptions Disp Refills   meloxicam (MOBIC) 15 MG tablet [Pharmacy Med Name: Meloxicam 15 MG Oral Tablet] 90 tablet 0    Sig: Take 1 tablet by mouth once daily     Analgesics:  COX2 Inhibitors Failed - 02/05/2023  9:23 AM      Failed - Manual Review: Labs are only required if the patient has taken medication for more than 8 weeks.      Failed - HGB in normal range and within 360 days    Hemoglobin  Date Value Ref Range Status  09/06/2021 15.1 13.0 - 17.7 g/dL Final         Failed - HCT in normal range and within 360 days    Hematocrit  Date Value Ref Range Status  09/06/2021 45.3 37.5 - 51.0 % Final         Passed - Cr in normal range and within 360 days    Creatinine  Date Value Ref Range Status  12/17/2013 0.93 0.60 - 1.30 mg/dL Final   Creatinine, Ser  Date Value Ref Range Status  10/06/2022 0.91 0.76 - 1.27 mg/dL Final         Passed - AST in normal range and within 360 days    AST  Date Value Ref Range Status  04/07/2022 17 0 - 40 IU/L Final   SGOT(AST)  Date Value Ref Range Status  12/17/2013 15 15 - 37 Unit/L Final         Passed - ALT in normal range and within 360 days    ALT  Date Value Ref Range Status  04/07/2022 16 0 - 44 IU/L Final   SGPT (ALT)  Date Value Ref Range Status  12/17/2013 29 U/L Final    Comment:    14-63 NOTE: New Reference Range 12/13/13          Passed - eGFR is 30 or above and within 360 days    EGFR (African American)  Date Value Ref Range Status  12/17/2013 >60  Final   GFR calc Af Amer  Date Value Ref Range Status  08/30/2019 101 >59 mL/min/1.73 Final   EGFR (Non-African Amer.)  Date Value Ref Range Status  12/17/2013 >60  Final    Comment:    eGFR values <62mL/min/1.73 m2 may be an indication of chronic kidney disease (CKD). Calculated eGFR is useful in patients with stable renal function. The eGFR calculation will not be  reliable in acutely ill patients when serum creatinine is changing rapidly. It is not useful in  patients on dialysis. The eGFR calculation may not be applicable to patients at the low and high extremes of body sizes, pregnant women, and vegetarians.    GFR calc non Af Amer  Date Value Ref Range Status  08/30/2019 88 >59 mL/min/1.73 Final   eGFR  Date Value Ref Range Status  10/06/2022 91 >59 mL/min/1.73 Final         Passed - Patient is not pregnant      Passed - Valid encounter within last 12 months    Recent Outpatient Visits           4 months ago Essential hypertension   [place] [facility]   4 months ago Annual physical exam   [facility]   5 months ago Systemic  venous anomaly   [facility] at [facility]   7 months ago Tendinopathy of left rotator cuff    [facility] at [facility], Ocie Bob, MD   9 months ago Tendinopathy of left rotator cuff   [facility] at [facility], Ocie Bob, MD       Future Appointments             In 2 months Duanne Limerick, MD [facility] at [facility], [facility]

## 2023-03-02 ENCOUNTER — Other Ambulatory Visit: Payer: Self-pay | Admitting: Family Medicine

## 2023-03-02 DIAGNOSIS — M25559 Pain in unspecified hip: Secondary | ICD-10-CM

## 2023-03-15 ENCOUNTER — Other Ambulatory Visit: Payer: Self-pay | Admitting: Family Medicine

## 2023-03-15 DIAGNOSIS — M25559 Pain in unspecified hip: Secondary | ICD-10-CM

## 2023-04-09 ENCOUNTER — Ambulatory Visit (INDEPENDENT_AMBULATORY_CARE_PROVIDER_SITE_OTHER): Payer: Medicare HMO | Admitting: Family Medicine

## 2023-04-09 ENCOUNTER — Encounter: Payer: Self-pay | Admitting: Family Medicine

## 2023-04-09 VITALS — BP 118/74 | HR 58 | Resp 16 | Ht 68.0 in | Wt 183.0 lb

## 2023-04-09 DIAGNOSIS — G250 Essential tremor: Secondary | ICD-10-CM

## 2023-04-09 DIAGNOSIS — I10 Essential (primary) hypertension: Secondary | ICD-10-CM | POA: Diagnosis not present

## 2023-04-09 DIAGNOSIS — K2100 Gastro-esophageal reflux disease with esophagitis, without bleeding: Secondary | ICD-10-CM | POA: Diagnosis not present

## 2023-04-09 DIAGNOSIS — J301 Allergic rhinitis due to pollen: Secondary | ICD-10-CM

## 2023-04-09 DIAGNOSIS — M25559 Pain in unspecified hip: Secondary | ICD-10-CM

## 2023-04-09 MED ORDER — FLUTICASONE PROPIONATE 50 MCG/ACT NA SUSP
2.0000 | Freq: Every day | NASAL | 6 refills | Status: DC
Start: 2023-04-09 — End: 2024-02-29

## 2023-04-09 MED ORDER — MELOXICAM 15 MG PO TABS
15.0000 mg | ORAL_TABLET | Freq: Every day | ORAL | 5 refills | Status: DC
Start: 2023-04-09 — End: 2024-02-29

## 2023-04-09 MED ORDER — METOPROLOL SUCCINATE ER 50 MG PO TB24
ORAL_TABLET | ORAL | 1 refills | Status: DC
Start: 2023-04-09 — End: 2023-10-01

## 2023-04-09 MED ORDER — ESOMEPRAZOLE MAGNESIUM 40 MG PO CPDR
40.0000 mg | DELAYED_RELEASE_CAPSULE | Freq: Every day | ORAL | 1 refills | Status: DC
Start: 2023-04-09 — End: 2023-10-01

## 2023-04-09 NOTE — Progress Notes (Signed)
Date:  04/09/2023   Name:  Derek Figueroa   DOB:  1951/02/13   MRN:  [id number]   Chief Complaint: Follow-up  Hypertension This is a chronic problem. The problem has been waxing and waning since onset. Pertinent negatives include no blurred vision, chest pain, headaches, neck pain, palpitations, PND or shortness of breath. There are no associated agents to hypertension. Risk factors for coronary artery disease include dyslipidemia. Past treatments include beta blockers. The current treatment provides moderate improvement. There are no compliance problems.  There is no history of angina, kidney disease, CAD/MI, CVA, heart failure or retinopathy.  Arthritis Presents for follow-up visit. He reports no pain, stiffness, joint swelling or joint warmth. The symptoms have been stable. Pertinent negatives include no fatigue or weight loss.  Gastroesophageal Reflux He reports no abdominal pain, no chest pain, no coughing, no dysphagia, no heartburn, no nausea, no sore throat or no wheezing. This is a chronic problem. The current episode started more than 1 year ago. The problem has been gradually improving. The symptoms are aggravated by certain foods. Pertinent negatives include no fatigue, melena, orthopnea or weight loss. He has tried a PPI for the symptoms. The treatment provided moderate relief. Past procedures do not include an abdominal ultrasound.  Sinusitis This is a new problem. Associated symptoms include sinus pressure. Pertinent negatives include no chills, congestion, coughing, ear pain, headaches, neck pain, shortness of breath or sore throat. Past treatments include nothing.    Lab Results  Component Value Date   NA 141 10/06/2022   K 4.5 10/06/2022   CO2 22 10/06/2022   GLUCOSE 93 10/06/2022   BUN 16 10/06/2022   CREATININE 0.91 10/06/2022   CALCIUM 9.0 10/06/2022   EGFR 91 10/06/2022   GFRNONAA 88 08/30/2019   Lab Results  Component Value Date   CHOL 193 09/26/2022    HDL 48 09/26/2022   LDLCALC 128 (H) 09/26/2022   TRIG 93 09/26/2022   CHOLHDL 3.8 08/20/2018   No results found for: "TSH" No results found for: "HGBA1C" Lab Results  Component Value Date   WBC 6.9 09/06/2021   HGB 15.1 09/06/2021   HCT 45.3 09/06/2021   MCV 87 09/06/2021   PLT 253 09/06/2021   Lab Results  Component Value Date   ALT 16 04/07/2022   AST 17 04/07/2022   ALKPHOS 61 04/07/2022   BILITOT 0.3 04/07/2022   No results found for: "25OHVITD2", "25OHVITD3", "VD25OH"   Review of Systems  Constitutional:  Negative for chills, fatigue and weight loss.  HENT:  Positive for sinus pressure. Negative for congestion, ear pain, postnasal drip and sore throat.   Eyes:  Negative for blurred vision, redness and visual disturbance.  Respiratory:  Negative for cough, chest tightness, shortness of breath and wheezing.   Cardiovascular:  Negative for chest pain, palpitations and PND.  Gastrointestinal:  Negative for abdominal pain, dysphagia, heartburn, melena and nausea.  Musculoskeletal:  Positive for arthritis. Negative for joint swelling, neck pain and stiffness.  Neurological:  Negative for headaches.    Patient Active Problem List   Diagnosis Date Noted   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   Tendinopathy of right rotator cuff 04/09/2022   History of colonic polyps    Polyp of transverse colon    Cholecystitis, acute    Obstruction of bile duct    Jaundice    Abnormal findings on imaging of biliary tract    Residual hemorrhoidal skin tags 06/23/2014   Right shoulder  pain 01/05/2014   Gastroesophageal reflux disease without esophagitis 01/05/2014   Elevated blood pressure 01/05/2014   History of adenomatous polyp of colon 05/26/2006    No Known Allergies  Past Surgical History:  Procedure Laterality Date   COLONOSCOPY     COLONOSCOPY WITH PROPOFOL N/A 10/11/2019   Procedure: COLONOSCOPY WITH PROPOFOL;  Surgeon: Midge Minium, MD;  Location: [facility] ENDOSCOPY;   Service: Endoscopy;  Laterality: N/A;   ERCP N/A 08/13/2017   Procedure: ENDOSCOPIC RETROGRADE CHOLANGIOPANCREATOGRAPHY (ERCP);  Surgeon: Midge Minium, MD;  Location: [facility] ENDOSCOPY;  Service: Endoscopy;  Laterality: N/A;   EYE SURGERY     gall stones     HERNIA REPAIR     ROBOTIC ASSISTED LAPAROSCOPIC CHOLECYSTECTOMY-MULTI SITE N/A 10/20/2017   Procedure: ROBOTIC ASSISTED LAPAROSCOPIC CHOLECYSTECTOMY-MULTI SITE;  Surgeon: Leafy Ro, MD;  Location: [facility] ORS;  Service: General;  Laterality: N/A;   UMBILICAL HERNIA REPAIR N/A 10/20/2017   Procedure: HERNIA REPAIR UMBILICAL ADULT;  Surgeon: Leafy Ro, MD;  Location: [facility] ORS;  Service: General;  Laterality: N/A;    Social History   Tobacco Use   Smoking status: Former    Current packs/day: 0.00    Types: Cigarettes    Quit date: 09/02/1978    Years since quitting: 44.6   Smokeless tobacco: Never   Tobacco comments:    quit 40 yrs  Vaping Use   Vaping status: Never Used  Substance Use Topics   Alcohol use: Yes    Alcohol/week: 3.0 standard drinks of alcohol    Types: 3 Glasses of wine per week    Comment: occasionally   Drug use: No     Medication list has been reviewed and updated.  Current Meds  Medication Sig   esomeprazole (NEXIUM) 40 MG capsule Take 1 capsule (40 mg total) by mouth daily.   gabapentin (NEURONTIN) 100 MG capsule Take 100 mg by mouth 2 (two) times daily.   latanoprost (XALATAN) 0.005 % ophthalmic solution Apply to eye. Eye doctor   meloxicam (MOBIC) 15 MG tablet Take 1 tablet by mouth once daily   metoprolol succinate (TOPROL-XL) 50 MG 24 hr tablet TAKE 1 TABLET BY MOUTH ONCE DAILY. TAKE WITH OR IMMEDIATELY FOLLOWING MEAL   Multiple Vitamins-Minerals (MULTIVITAMIN ADULT PO) Take 1 tablet by mouth daily.    Omega-3 Fatty Acids (FISH OIL PO) Take 1 capsule by mouth daily.    Probiotic Product (PROBIOTIC-10 PO)        10/06/2022   10:31 AM 09/26/2022    9:03 AM 09/05/2022    3:16 PM 04/07/2022     1:58 PM  GAD 7 : Generalized Anxiety Score  Nervous, Anxious, on Edge 0 0 0 0  Control/stop worrying 0 0 0 0  Worry too much - different things 0 0 0 0  Trouble relaxing 0 0 0 0  Restless 0 0 0 0  Easily annoyed or irritable 0 0 0 0  Afraid - awful might happen 0 0 0 0  Total GAD 7 Score 0 0 0 0  Anxiety Difficulty Not difficult at all Not difficult at all Not difficult at all Not difficult at all       04/09/2023   10:57 AM 10/30/2022    9:02 AM 10/06/2022   10:30 AM  Depression screen PHQ 2/9  Decreased Interest 0 0 0  Down, Depressed, Hopeless 0 0 0  PHQ - 2 Score 0 0 0  Altered sleeping  0 0  Tired, decreased energy  0  0  Change in appetite  0 0  Feeling bad or failure about yourself   0 0  Trouble concentrating  0 0  Moving slowly or fidgety/restless  0 0  Suicidal thoughts  0 0  PHQ-9 Score  0 0  Difficult doing work/chores  Not difficult at all Not difficult at all    BP Readings from Last 3 Encounters:  04/09/23 118/74  10/06/22 120/64  09/26/22 120/78    Physical Exam Vitals and nursing note reviewed.  HENT:     Head: Normocephalic.     Right Ear: External ear normal.     Left Ear: External ear normal.     Nose: Nose normal.  Eyes:     General: No scleral icterus.       Right eye: No discharge.        Left eye: No discharge.     Conjunctiva/sclera: Conjunctivae normal.     Pupils: Pupils are equal, round, and reactive to light.  Neck:     Thyroid: No thyromegaly.     Vascular: No JVD.     Trachea: No tracheal deviation.  Cardiovascular:     Rate and Rhythm: Normal rate and regular rhythm.     Heart sounds: Normal heart sounds. No murmur heard.    No friction rub. No gallop.  Pulmonary:     Effort: No respiratory distress.     Breath sounds: Normal breath sounds. No wheezing or rales.  Abdominal:     General: Bowel sounds are normal.     Palpations: Abdomen is soft. There is no mass.     Tenderness: There is no abdominal tenderness. There is no  guarding or rebound.  Musculoskeletal:        General: No tenderness. Normal range of motion.     Cervical back: Normal range of motion and neck supple.  Lymphadenopathy:     Cervical: No cervical adenopathy.  Skin:    General: Skin is warm.     Findings: No rash.  Neurological:     Mental Status: He is alert and oriented to person, place, and time.     Cranial Nerves: No cranial nerve deficit.     Deep Tendon Reflexes: Reflexes are normal and symmetric.     Wt Readings from Last 3 Encounters:  04/09/23 183 lb (83 kg)  10/30/22 182 lb (82.6 kg)  10/06/22 182 lb (82.6 kg)    BP 118/74   Pulse (!) 58   Resp 16   Ht 5\' 8"  (1.727 m)   Wt 183 lb (83 kg)   SpO2 96%   BMI 27.83 kg/m   Assessment and Plan: 1. Essential hypertension Chronic.  Controlled.  Stable.  Asymptomatic.  Current dosing of medication we will continue with metoprolol XL 50 mg once a day.  Blood pressure today is satisfactory at 118/74 and we will continue with current dosings and recheck renal function panel to evaluate for renal function with GFR and electrolytes. - metoprolol succinate (TOPROL-XL) 50 MG 24 hr tablet; TAKE 1 TABLET BY MOUTH ONCE DAILY. TAKE WITH OR IMMEDIATELY FOLLOWING MEAL  Dispense: 90 tablet; Refill: 1 - Renal Function Panel  2. Seasonal allergic rhinitis due to pollen Chronic.  Episodic.  Stable.  Will control symptomatology with Flonase 50 mcg 2 sprays in each nostril daily. - fluticasone (FLONASE) 50 MCG/ACT nasal spray; Place 2 sprays into both nostrils daily.  Dispense: 16 g; Refill: 6  3. Gastroesophageal reflux disease with esophagitis without hemorrhage Chronic.  Controlled.  Stable.  Continue Nexium 40 mg once a day. - esomeprazole (NEXIUM) 40 MG capsule; Take 1 capsule (40 mg total) by mouth daily.  Dispense: 90 capsule; Refill: 1  4. Hip pain Chronic.  Controlled.  Stable.  Continue meloxicam 15 mg once a day.  Will check current dosing of - meloxicam (MOBIC) 15 MG tablet;  Take 1 tablet (15 mg total) by mouth daily.  Dispense: 30 tablet; Refill: 5  5. Essential tremor Chronic.  Controlled.  Stable.  Patient is currently followed by neurology for essential tremor.  Currently he is taking metoprolol XL 50 mg once a day.  Patient is still symptomatic with some improved.  Patient does have an upcoming appointment with Dr. Malvin Johns - metoprolol succinate (TOPROL-XL) 50 MG 24 hr tablet; TAKE 1 TABLET BY MOUTH ONCE DAILY. TAKE WITH OR IMMEDIATELY FOLLOWING MEAL  Dispense: 90 tablet; Refill: 1     Elizabeth Sauer, MD

## 2023-04-10 ENCOUNTER — Encounter: Payer: Self-pay | Admitting: Family Medicine

## 2023-04-10 LAB — RENAL FUNCTION PANEL
Albumin: 4.3 g/dL (ref 3.9–4.9)
BUN/Creatinine Ratio: 10 (ref 10–24)
BUN: 10 mg/dL (ref 8–27)
CO2: 22 mmol/L (ref 20–29)
Calcium: 9.2 mg/dL (ref 8.6–10.2)
Chloride: 104 mmol/L (ref 96–106)
Creatinine, Ser: 0.96 mg/dL (ref 0.76–1.27)
Glucose: 90 mg/dL (ref 70–99)
Phosphorus: 2.7 mg/dL — ABNORMAL LOW (ref 2.8–4.1)
Potassium: 4.6 mmol/L (ref 3.5–5.2)
Sodium: 141 mmol/L (ref 134–144)
eGFR: 85 mL/min/{1.73_m2} (ref >59)

## 2023-04-27 ENCOUNTER — Encounter: Payer: Self-pay | Admitting: Family Medicine

## 2023-05-07 DIAGNOSIS — H9312 Tinnitus, left ear: Secondary | ICD-10-CM | POA: Diagnosis not present

## 2023-05-07 DIAGNOSIS — G250 Essential tremor: Secondary | ICD-10-CM | POA: Diagnosis not present

## 2023-05-07 DIAGNOSIS — H9193 Unspecified hearing loss, bilateral: Secondary | ICD-10-CM | POA: Diagnosis not present

## 2023-07-07 DIAGNOSIS — H35372 Puckering of macula, left eye: Secondary | ICD-10-CM | POA: Diagnosis not present

## 2023-07-07 DIAGNOSIS — H25813 Combined forms of age-related cataract, bilateral: Secondary | ICD-10-CM | POA: Diagnosis not present

## 2023-07-07 DIAGNOSIS — H40023 Open angle with borderline findings, high risk, bilateral: Secondary | ICD-10-CM | POA: Diagnosis not present

## 2023-07-07 DIAGNOSIS — H35363 Drusen (degenerative) of macula, bilateral: Secondary | ICD-10-CM | POA: Diagnosis not present

## 2023-07-07 DIAGNOSIS — H43822 Vitreomacular adhesion, left eye: Secondary | ICD-10-CM | POA: Diagnosis not present

## 2023-07-13 DIAGNOSIS — H43822 Vitreomacular adhesion, left eye: Secondary | ICD-10-CM | POA: Diagnosis not present

## 2023-07-13 DIAGNOSIS — H3581 Retinal edema: Secondary | ICD-10-CM | POA: Diagnosis not present

## 2023-07-13 DIAGNOSIS — H2513 Age-related nuclear cataract, bilateral: Secondary | ICD-10-CM | POA: Diagnosis not present

## 2023-07-13 DIAGNOSIS — H35372 Puckering of macula, left eye: Secondary | ICD-10-CM | POA: Diagnosis not present

## 2023-07-13 DIAGNOSIS — H401134 Primary open-angle glaucoma, bilateral, indeterminate stage: Secondary | ICD-10-CM | POA: Diagnosis not present

## 2023-07-13 DIAGNOSIS — H43813 Vitreous degeneration, bilateral: Secondary | ICD-10-CM | POA: Diagnosis not present

## 2023-07-14 DIAGNOSIS — H9319 Tinnitus, unspecified ear: Secondary | ICD-10-CM | POA: Diagnosis not present

## 2023-07-14 DIAGNOSIS — H9193 Unspecified hearing loss, bilateral: Secondary | ICD-10-CM | POA: Diagnosis not present

## 2023-07-14 DIAGNOSIS — G250 Essential tremor: Secondary | ICD-10-CM | POA: Diagnosis not present

## 2023-07-16 ENCOUNTER — Encounter: Payer: Self-pay | Admitting: Family Medicine

## 2023-07-16 NOTE — Telephone Encounter (Signed)
Please call pt to schedule an appointment.  KP

## 2023-07-30 DIAGNOSIS — H35372 Puckering of macula, left eye: Secondary | ICD-10-CM | POA: Diagnosis not present

## 2023-07-30 DIAGNOSIS — H3581 Retinal edema: Secondary | ICD-10-CM | POA: Diagnosis not present

## 2023-08-07 DIAGNOSIS — Z9889 Other specified postprocedural states: Secondary | ICD-10-CM | POA: Diagnosis not present

## 2023-08-07 DIAGNOSIS — H43813 Vitreous degeneration, bilateral: Secondary | ICD-10-CM | POA: Diagnosis not present

## 2023-08-07 DIAGNOSIS — H35372 Puckering of macula, left eye: Secondary | ICD-10-CM | POA: Diagnosis not present

## 2023-08-07 DIAGNOSIS — H401134 Primary open-angle glaucoma, bilateral, indeterminate stage: Secondary | ICD-10-CM | POA: Diagnosis not present

## 2023-08-07 DIAGNOSIS — H2513 Age-related nuclear cataract, bilateral: Secondary | ICD-10-CM | POA: Diagnosis not present

## 2023-09-01 ENCOUNTER — Other Ambulatory Visit: Payer: Self-pay | Admitting: Family Medicine

## 2023-09-01 DIAGNOSIS — I10 Essential (primary) hypertension: Secondary | ICD-10-CM

## 2023-09-01 DIAGNOSIS — G250 Essential tremor: Secondary | ICD-10-CM

## 2023-09-01 NOTE — Telephone Encounter (Signed)
 Copied from CRM [id number]. Topic: Clinical - Medication Refill >> Sep 01, 2023  8:47 AM Vista Lawman wrote: Most Recent Primary Care Visit:  Provider: Duanne Limerick  Department: [facility]  Visit Type: OFFICE VISIT  Date: 04/09/2023  Medication: metoprolol succinate (TOPROL-XL) 50 MG 24 hr tablet  Has the patient contacted their pharmacy? No (Agent: If no, request that the patient contact the pharmacy for the refill. If patient does not wish to contact the pharmacy document the reason why and proceed with request.) (Agent: If yes, when and what did the pharmacy advise?)  Is this the correct pharmacy for this prescription? Yes If no, delete pharmacy and type the correct one.  This is the patient's preferred pharmacy:  [facility] [address], [place] - [address] [place] [address] Phone: [phone] Fax: [phone]   Has the prescription been filled recently? Yes  Is the patient out of the medication? No  Has the patient been seen for an appointment in the last year OR does the patient have an upcoming appointment? Yes  Can we respond through MyChart? No  Agent: Please be advised that Rx refills may take up to 3 business days. We ask that you follow-up with your pharmacy.

## 2023-09-04 ENCOUNTER — Other Ambulatory Visit: Payer: Self-pay

## 2023-09-08 DIAGNOSIS — H35372 Puckering of macula, left eye: Secondary | ICD-10-CM | POA: Diagnosis not present

## 2023-09-08 DIAGNOSIS — H2512 Age-related nuclear cataract, left eye: Secondary | ICD-10-CM | POA: Diagnosis not present

## 2023-09-08 DIAGNOSIS — H33312 Horseshoe tear of retina without detachment, left eye: Secondary | ICD-10-CM | POA: Diagnosis not present

## 2023-09-08 DIAGNOSIS — Z9889 Other specified postprocedural states: Secondary | ICD-10-CM | POA: Diagnosis not present

## 2023-09-08 DIAGNOSIS — H31092 Other chorioretinal scars, left eye: Secondary | ICD-10-CM | POA: Diagnosis not present

## 2023-09-09 DIAGNOSIS — H35372 Puckering of macula, left eye: Secondary | ICD-10-CM | POA: Diagnosis not present

## 2023-09-09 DIAGNOSIS — H35363 Drusen (degenerative) of macula, bilateral: Secondary | ICD-10-CM | POA: Diagnosis not present

## 2023-09-09 DIAGNOSIS — Z0100 Encounter for examination of eyes and vision without abnormal findings: Secondary | ICD-10-CM | POA: Diagnosis not present

## 2023-09-09 DIAGNOSIS — H40023 Open angle with borderline findings, high risk, bilateral: Secondary | ICD-10-CM | POA: Diagnosis not present

## 2023-09-09 DIAGNOSIS — H2513 Age-related nuclear cataract, bilateral: Secondary | ICD-10-CM | POA: Diagnosis not present

## 2023-10-01 ENCOUNTER — Encounter: Payer: Self-pay | Admitting: Family Medicine

## 2023-10-01 ENCOUNTER — Ambulatory Visit (INDEPENDENT_AMBULATORY_CARE_PROVIDER_SITE_OTHER): Payer: Medicare HMO | Admitting: Family Medicine

## 2023-10-01 VITALS — BP 120/86 | HR 60 | Ht 68.0 in | Wt 189.0 lb

## 2023-10-01 DIAGNOSIS — K2100 Gastro-esophageal reflux disease with esophagitis, without bleeding: Secondary | ICD-10-CM

## 2023-10-01 DIAGNOSIS — G250 Essential tremor: Secondary | ICD-10-CM

## 2023-10-01 DIAGNOSIS — I10 Essential (primary) hypertension: Secondary | ICD-10-CM | POA: Diagnosis not present

## 2023-10-01 DIAGNOSIS — E7800 Pure hypercholesterolemia, unspecified: Secondary | ICD-10-CM

## 2023-10-01 DIAGNOSIS — R351 Nocturia: Secondary | ICD-10-CM

## 2023-10-01 MED ORDER — ESOMEPRAZOLE MAGNESIUM 40 MG PO CPDR: 40.0000 mg | DELAYED_RELEASE_CAPSULE | Freq: Every day | ORAL | 1 refills | Status: DC

## 2023-10-01 MED ORDER — METOPROLOL SUCCINATE ER 50 MG PO TB24: ORAL_TABLET | ORAL | 1 refills | Status: DC

## 2023-10-01 NOTE — Progress Notes (Signed)
 Date:  10/01/2023   Name:  Derek Figueroa   DOB:  1951/03/09   MRN:  [id number]   Chief Complaint: Hypertension  Hypertension This is a chronic problem. The current episode started more than 1 year ago. The problem has been gradually improving since onset. The problem is controlled. Pertinent negatives include no anxiety, blurred vision, chest pain, headaches, malaise/fatigue, neck pain, orthopnea, palpitations, peripheral edema, PND, shortness of breath or sweats. There are no associated agents to hypertension. There are no known risk factors for coronary artery disease. Past treatments include beta blockers. The current treatment provides moderate improvement. There are no compliance problems.  There is no history of angina, CAD/MI or CVA. There is no history of chronic renal disease, a hypertension causing med or renovascular disease.  Gastroesophageal Reflux He reports no abdominal pain, no chest pain, no choking, no coughing or no wheezing. This is a chronic problem. The current episode started more than 1 year ago. The problem has been gradually improving. The symptoms are aggravated by certain foods. Pertinent negatives include no fatigue or melena. He has tried a PPI for the symptoms. The treatment provided moderate relief.    Lab Results  Component Value Date   NA 141 04/09/2023   K 4.6 04/09/2023   CO2 22 04/09/2023   GLUCOSE 90 04/09/2023   BUN 10 04/09/2023   CREATININE 0.96 04/09/2023   CALCIUM 9.2 04/09/2023   EGFR 85 04/09/2023   GFRNONAA 88 08/30/2019   Lab Results  Component Value Date   CHOL 193 09/26/2022   HDL 48 09/26/2022   LDLCALC 128 (H) 09/26/2022   TRIG 93 09/26/2022   CHOLHDL 3.8 08/20/2018   No results found for: "TSH" No results found for: "HGBA1C" Lab Results  Component Value Date   WBC 6.9 09/06/2021   HGB 15.1 09/06/2021   HCT 45.3 09/06/2021   MCV 87 09/06/2021   PLT 253 09/06/2021   Lab Results  Component Value Date   ALT 16  04/07/2022   AST 17 04/07/2022   ALKPHOS 61 04/07/2022   BILITOT 0.3 04/07/2022   No results found for: "25OHVITD2", "25OHVITD3", "VD25OH"   Review of Systems  Constitutional:  Negative for fatigue and malaise/fatigue.  Eyes:  Negative for blurred vision, redness and visual disturbance.  Respiratory:  Negative for cough, choking, chest tightness, shortness of breath, wheezing and stridor.   Cardiovascular:  Negative for chest pain, palpitations, orthopnea and PND.  Gastrointestinal:  Negative for abdominal pain, blood in stool and melena.  Endocrine: Negative for polydipsia and polyuria.  Genitourinary:  Negative for genital sores, hematuria and penile discharge.  Musculoskeletal:  Negative for neck pain.  Neurological:  Negative for headaches.    Patient Active Problem List   Diagnosis Date Noted   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   Tendinopathy of right rotator cuff 04/09/2022   History of colonic polyps    Polyp of transverse colon    Cholecystitis, acute    Obstruction of bile duct    Jaundice    Abnormal findings on imaging of biliary tract    Residual hemorrhoidal skin tags 06/23/2014   Right shoulder pain 01/05/2014   Gastroesophageal reflux disease without esophagitis 01/05/2014   Elevated blood pressure 01/05/2014   History of adenomatous polyp of colon 05/26/2006    No Known Allergies  Past Surgical History:  Procedure Laterality Date   COLONOSCOPY     COLONOSCOPY WITH PROPOFOL  N/A 10/11/2019   Procedure: COLONOSCOPY WITH PROPOFOL ;  Surgeon: Marnee Sink, MD;  Location[facility] ENDOSCOPY;  Service: Endoscopy;  Laterality: N/A;   ERCP N/A 08/13/2017   Procedure: ENDOSCOPIC RETROGRADE CHOLANGIOPANCREATOGRAPHY (ERCP);  Surgeon: Marnee Sink, MD;  Location: [facility] ENDOSCOPY;  Service: Endoscopy;  Laterality: N/A;   EYE SURGERY     gall stones     HERNIA REPAIR     ROBOTIC ASSISTED LAPAROSCOPIC CHOLECYSTECTOMY-MULTI SITE N/A 10/20/2017   Procedure: ROBOTIC  ASSISTED LAPAROSCOPIC CHOLECYSTECTOMY-MULTI SITE;  Surgeon: Alben Alma, MD;  Location: [facility] ORS;  Service: General;  Laterality: N/A;   UMBILICAL HERNIA REPAIR N/A 10/20/2017   Procedure: HERNIA REPAIR UMBILICAL ADULT;  Surgeon: Alben Alma, MD;  Location: [facility] ORS;  Service: General;  Laterality: N/A;    Social History   Tobacco Use   Smoking status: Former    Current packs/day: 0.00    Types: Cigarettes    Quit date: 09/02/1978    Years since quitting: 45.1   Smokeless tobacco: Never   Tobacco comments:    quit 40 yrs  Vaping Use   Vaping status: Never Used  Substance Use Topics   Alcohol use: Yes    Alcohol/week: 3.0 standard drinks of alcohol    Types: 3 Glasses of wine per week    Comment: occasionally   Drug use: No     Medication list has been reviewed and updated.  Current Meds  Medication Sig   fluticasone  (FLONASE ) 50 MCG/ACT nasal spray Place 2 sprays into both nostrils daily.   latanoprost (XALATAN) 0.005 % ophthalmic solution Apply to eye. Eye doctor   meloxicam  (MOBIC ) 15 MG tablet Take 1 tablet (15 mg total) by mouth daily.   Multiple Vitamins-Minerals (MULTIVITAMIN ADULT PO) Take 1 tablet by mouth daily.    Omega-3 Fatty Acids (FISH OIL PO) Take 1 capsule by mouth daily.    [DISCONTINUED] esomeprazole  (NEXIUM ) 40 MG capsule Take 1 capsule (40 mg total) by mouth daily.   [DISCONTINUED] metoprolol  succinate (TOPROL -XL) 50 MG 24 hr tablet TAKE 1 TABLET BY MOUTH ONCE DAILY. TAKE WITH OR IMMEDIATELY FOLLOWING MEAL       10/01/2023   10:11 AM 10/06/2022   10:31 AM 09/26/2022    9:03 AM 09/05/2022    3:16 PM  GAD 7 : Generalized Anxiety Score  Nervous, Anxious, on Edge 0 0 0 0  Control/stop worrying 0 0 0 0  Worry too much - different things 0 0 0 0  Trouble relaxing 0 0 0 0  Restless 0 0 0 0  Easily annoyed or irritable 0 0 0 0  Afraid - awful might happen 0 0 0 0  Total GAD 7 Score 0 0 0 0  Anxiety Difficulty Not difficult at all Not difficult at all  Not difficult at all Not difficult at all       10/01/2023   10:11 AM 04/09/2023   10:57 AM 10/30/2022    9:02 AM  Depression screen PHQ 2/9  Decreased Interest 0 0 0  Down, Depressed, Hopeless 0 0 0  PHQ - 2 Score 0 0 0  Altered sleeping 0  0  Tired, decreased energy 0  0  Change in appetite 0  0  Feeling bad or failure about yourself  0  0  Trouble concentrating 0  0  Moving slowly or fidgety/restless 0  0  Suicidal thoughts 0  0  PHQ-9 Score 0  0  Difficult doing work/chores Not difficult at all  Not difficult at all    BP Readings  from Last 3 Encounters:  10/01/23 120/86  04/09/23 118/74  10/06/22 120/64    Physical Exam Vitals and nursing note reviewed.  Constitutional:      Appearance: He is well-developed.  HENT:     Head: Normocephalic and atraumatic.     Right Ear: Tympanic membrane, ear canal and external ear normal.     Left Ear: Tympanic membrane, ear canal and external ear normal.     Nose: Nose normal.     Mouth/Throat:     Mouth: Mucous membranes are moist.     Dentition: Normal dentition.  Eyes:     General: Lids are normal. No scleral icterus.    Conjunctiva/sclera: Conjunctivae normal.     Pupils: Pupils are equal, round, and reactive to light.  Neck:     Thyroid: No thyromegaly.     Vascular: No carotid bruit, hepatojugular reflux or JVD.     Trachea: No tracheal deviation.  Cardiovascular:     Rate and Rhythm: Normal rate and regular rhythm.     Heart sounds: Normal heart sounds. No murmur heard.    No friction rub. No gallop.  Pulmonary:     Effort: Pulmonary effort is normal.     Breath sounds: Normal breath sounds. No rhonchi or rales.  Abdominal:     General: Bowel sounds are normal.     Palpations: Abdomen is soft. There is no hepatomegaly, splenomegaly or mass.     Tenderness: There is no abdominal tenderness.     Hernia: There is no hernia in the left inguinal area.  Musculoskeletal:        General: Normal range of motion.      Cervical back: Normal range of motion and neck supple.  Lymphadenopathy:     Cervical: No cervical adenopathy.  Skin:    General: Skin is warm and dry.     Findings: No rash.  Neurological:     Mental Status: He is alert and oriented to person, place, and time.     Sensory: No sensory deficit.     Deep Tendon Reflexes: Reflexes are normal and symmetric.  Psychiatric:        Mood and Affect: Mood is not anxious or depressed.     Wt Readings from Last 3 Encounters:  10/01/23 189 lb (85.7 kg)  04/09/23 183 lb (83 kg)  10/30/22 182 lb (82.6 kg)    BP 120/86 (Cuff Size: Normal)   Pulse 60   Ht 5\' 8"  (1.727 m)   Wt 189 lb (85.7 kg)   SpO2 98%   BMI 28.74 kg/m   Assessment and Plan:  1. Gastroesophageal reflux disease with esophagitis without hemorrhage Chronic.  Controlled.  Stable.  Continue Nexium  40 mg once a day.  Will recheck patient in 6 months. - esomeprazole  (NEXIUM ) 40 MG capsule; Take 1 capsule (40 mg total) by mouth daily.  Dispense: 90 capsule; Refill: 1  2. Essential tremor Chronic.  Controlled.  Stable.  Patient currently taking Toprol -XL 50 mg which has not really changed the intensity of the tremor as patient notes. - metoprolol  succinate (TOPROL -XL) 50 MG 24 hr tablet; TAKE 1 TABLET BY MOUTH ONCE DAILY. TAKE WITH OR IMMEDIATELY FOLLOWING MEAL  Dispense: 90 tablet; Refill: 1  3. Essential hypertension Chronic.  Controlled.  Stable.  Continue metoprolol  XL once a day.  Blood pressure today is controlled at 120/86.  Will check CMP for electrolytes and GFR. - metoprolol  succinate (TOPROL -XL) 50 MG 24 hr tablet; TAKE 1 TABLET  BY MOUTH ONCE DAILY. TAKE WITH OR IMMEDIATELY FOLLOWING MEAL  Dispense: 90 tablet; Refill: 1 - Comprehensive metabolic panel with GFR  4. Nocturia (Primary) Patient with some nocturia will check PSA.  Patient defers DRE. - PSA  5. Elevated LDL cholesterol level Chronic.  Controlled.  Stable.  This is diet control and patient has been  provided low-cholesterol low triglyceride dietary guidelines and we will check current level of control with lipid panel. - Lipid Panel With LDL/HDL Ratio    Alayne Allis, MD

## 2023-10-01 NOTE — Patient Instructions (Signed)

## 2023-10-02 ENCOUNTER — Encounter: Payer: Self-pay | Admitting: Family Medicine

## 2023-10-02 LAB — COMPREHENSIVE METABOLIC PANEL WITH GFR
ALT: 17 IU/L (ref 0–44)
AST: 19 IU/L (ref 0–40)
Albumin: 4.1 g/dL (ref 3.9–4.9)
Alkaline Phosphatase: 74 IU/L (ref 44–121)
BUN/Creatinine Ratio: 14 (ref 10–24)
BUN: 13 mg/dL (ref 8–27)
Bilirubin Total: 0.2 mg/dL (ref 0.0–1.2)
CO2: 23 mmol/L (ref 20–29)
Calcium: 9.3 mg/dL (ref 8.6–10.2)
Chloride: 104 mmol/L (ref 96–106)
Creatinine, Ser: 0.96 mg/dL (ref 0.76–1.27)
Globulin, Total: 2.3 g/dL (ref 1.5–4.5)
Glucose: 101 mg/dL — ABNORMAL HIGH (ref 70–99)
Potassium: 4.5 mmol/L (ref 3.5–5.2)
Sodium: 141 mmol/L (ref 134–144)
Total Protein: 6.4 g/dL (ref 6.0–8.5)
eGFR: 85 mL/min/{1.73_m2} (ref >59)

## 2023-10-02 LAB — PSA: Prostate Specific Ag, Serum: 2.1 ng/mL (ref 0.0–4.0)

## 2023-10-02 LAB — LIPID PANEL WITH LDL/HDL RATIO
Cholesterol, Total: 223 mg/dL — ABNORMAL HIGH (ref 100–199)
HDL: 54 mg/dL (ref >39)
LDL Chol Calc (NIH): 146 mg/dL — ABNORMAL HIGH (ref 0–99)
LDL/HDL Ratio: 2.7 ratio (ref 0.0–3.6)
Triglycerides: 128 mg/dL (ref 0–149)
VLDL Cholesterol Cal: 23 mg/dL (ref 5–40)

## 2023-10-26 DIAGNOSIS — G250 Essential tremor: Secondary | ICD-10-CM | POA: Diagnosis not present

## 2023-10-26 DIAGNOSIS — G3184 Mild cognitive impairment, so stated: Secondary | ICD-10-CM | POA: Diagnosis not present

## 2023-11-04 ENCOUNTER — Ambulatory Visit (INDEPENDENT_AMBULATORY_CARE_PROVIDER_SITE_OTHER): Payer: Medicare HMO | Admitting: Emergency Medicine

## 2023-11-04 VITALS — Ht 68.0 in | Wt 185.0 lb

## 2023-11-04 DIAGNOSIS — Z0000 Encounter for general adult medical examination without abnormal findings: Secondary | ICD-10-CM | POA: Diagnosis not present

## 2023-11-04 NOTE — Patient Instructions (Signed)
 Derek Figueroa , Thank you for taking time out of your busy schedule to complete your Annual Wellness Visit with me. I enjoyed our conversation and look forward to speaking with you again next year. I, as well as your care team,  appreciate your ongoing commitment to your health goals. Please review the following plan we discussed and let me know if I can assist you in the future. Your Game plan/ To Do List    Referrals: None  Follow up Visits: Next Medicare AWV with our clinical staff: None scheduled. Patient has not decided on a PCP since Dr. Rochelle Chu' retirement. Have you seen your provider in the last 6 months (3 months if uncontrolled diabetes)? Yes Next Office Visit with your provider: None scheduled. Patient has not decided on a PCP since Dr. Rochelle Chu' retirement.  Clinician Recommendations:  Aim for 30 minutes of exercise or brisk walking, 6-8 glasses of water, and 5 servings of fruits and vegetables each day.       This is a list of the screening recommended for you and due dates:  Health Maintenance  Topic Date Due   Zoster (Shingles) Vaccine (1 of 2) Never done   DTaP/Tdap/Td vaccine (5 - Td or Tdap) 06/21/2023   Pneumonia Vaccine (2 of 2 - PPSV23) 04/08/2024*   Flu Shot  12/25/2023   Colon Cancer Screening  10/10/2024   Medicare Annual Wellness Visit  11/03/2024   Hepatitis C Screening  Completed   HPV Vaccine  Aged Out   Meningitis B Vaccine  Aged Out   COVID-19 Vaccine  Discontinued  *Topic was postponed. The date shown is not the original due date.    Advanced directives: (Copy Requested) Please bring a copy of your health care power of attorney and living will to the office to be added to your chart at your convenience. You can mail to [facility] [address] [place], [place] [address] or email to [email or web address] Advance Care Planning is important because it:  [x]  Makes sure you receive the medical care that is consistent with your values,  goals, and preferences  [x]  It provides guidance to your family and loved ones and reduces their decisional burden about whether or not they are making the right decisions based on your wishes.  Follow the link provided in your after visit summary or read over the paperwork we have mailed to you to help you started getting your Advance Directives in place. If you need assistance in completing these, please reach out to us  so that we can help you!  See attachments for Preventive Care and Fall Prevention Tips.   Fall Prevention in the Home, Adult Falls can cause injuries and affect people of all ages. There are many simple things that you can do to make your home safe and to help prevent falls. If you need it, ask for help making these changes. What actions can I take to prevent falls? General information Use good lighting in all rooms. Make sure to: Replace any light bulbs that burn out. Turn on lights if it is dark and use night-lights. Keep items that you use often in easy-to-reach places. Lower the shelves around your home if needed. Move furniture so that there are clear paths around it. Do not keep throw rugs or other things on the floor that can make you trip. If any of your floors are uneven, fix them. Add color or contrast paint or tape to clearly mark and help you see:  Grab bars or handrails. First and last steps of staircases. Where the edge of each step is. If you use a ladder or stepladder: Make sure that it is fully opened. Do not climb a closed ladder. Make sure the sides of the ladder are locked in place. Have someone hold the ladder while you use it. Know where your pets are as you move through your home. What can I do in the bathroom?     Keep the floor dry. Clean up any water that is on the floor right away. Remove soap buildup in the bathtub or shower. Buildup makes bathtubs and showers slippery. Use non-skid mats or decals on the floor of the bathtub or  shower. Attach bath mats securely with double-sided, non-slip rug tape. If you need to sit down while you are in the shower, use a non-slip stool. Install grab bars by the toilet and in the bathtub and shower. Do not use towel bars as grab bars. What can I do in the bedroom? Make sure that you have a light by your bed that is easy to reach. Do not use any sheets or blankets on your bed that hang to the floor. Have a firm bench or chair with side arms that you can use for support when you get dressed. What can I do in the kitchen? Clean up any spills right away. If you need to reach something above you, use a sturdy step stool that has a grab bar. Keep electrical cables out of the way. Do not use floor polish or wax that makes floors slippery. What can I do with my stairs? Do not leave anything on the stairs. Make sure that you have a light switch at the top and the bottom of the stairs. Have them installed if you do not have them. Make sure that there are handrails on both sides of the stairs. Fix handrails that are broken or loose. Make sure that handrails are as long as the staircases. Install non-slip stair treads on all stairs in your home if they do not have carpet. Avoid having throw rugs at the top or bottom of stairs, or secure the rugs with carpet tape to prevent them from moving. Choose a carpet design that does not hide the edge of steps on the stairs. Make sure that carpet is firmly attached to the stairs. Fix any carpet that is loose or worn. What can I do on the outside of my home? Use bright outdoor lighting. Repair the edges of walkways and driveways and fix any cracks. Clear paths of anything that can make you trip, such as tools or rocks. Add color or contrast paint or tape to clearly mark and help you see high doorway thresholds. Trim any bushes or trees on the main path into your home. Check that handrails are securely fastened and in good repair. Both sides of all steps  should have handrails. Install guardrails along the edges of any raised decks or porches. Have leaves, snow, and ice cleared regularly. Use sand, salt, or ice melt on walkways during winter months if you live where there is ice and snow. In the garage, clean up any spills right away, including grease or oil spills. What other actions can I take? Review your medicines with your health care provider. Some medicines can make you confused or feel dizzy. This can increase your chance of falling. Wear closed-toe shoes that fit well and support your feet. Wear shoes that have rubber soles and low  heels. Use a cane, walker, scooter, or crutches that help you move around if needed. Talk with your provider about other ways that you can decrease your risk of falls. This may include seeing a physical therapist to learn to do exercises to improve movement and strength. Where to find more information Centers for Disease Control and Prevention, [employer]: TonerPromos.no [employer] on Aging: BaseRingTones.pl National Institute on Aging: BaseRingTones.pl Contact a health care provider if: You are afraid of falling at home. You feel weak, drowsy, or dizzy at home. You fall at home. Get help right away if you: Lose consciousness or have trouble moving after a fall. Have a fall that causes a head injury. These symptoms may be an emergency. Get help right away. Call 911. Do not wait to see if the symptoms will go away. Do not drive yourself to the hospital. This information is not intended to replace advice given to you by your health care provider. Make sure you discuss any questions you have with your health care provider. Document Revised: 01/13/2022 Document Reviewed: 01/13/2022 Elsevier Patient Education  2024 [employer].

## 2023-11-04 NOTE — Progress Notes (Signed)
 Subjective:   Derek Figueroa is a 73 y.o. who presents for a Medicare Wellness preventive visit.  As a reminder, Annual Wellness Visits don't include a physical exam, and some assessments may be limited, especially if this visit is performed virtually. We may recommend an in-person follow-up visit with your provider if needed.  Visit Complete: Virtual I connected with  Roczen Waymire on 11/04/23 by a audio enabled telemedicine application and verified that I am speaking with the correct person using two identifiers.  Patient Location: Home  Provider Location: Home Office  I discussed the limitations of evaluation and management by telemedicine. The patient expressed understanding and agreed to proceed.  Vital Signs: Because this visit was a virtual/telehealth visit, some criteria may be missing or patient reported. Any vitals not documented were not able to be obtained and vitals that have been documented are patient reported.  VideoDeclined- This patient declined Librarian, academic. Therefore the visit was completed with audio only.  Persons Participating in Visit: Patient.  AWV Questionnaire: No: Patient Medicare AWV questionnaire was not completed prior to this visit.  Cardiac Risk Factors include: advanced age (>34men, >47 women);male gender;hypertension     Objective:     Today's Vitals   11/04/23 1607  Weight: 185 lb (83.9 kg)  Height: 5' 8 (1.727 m)   Body mass index is 28.13 kg/m.     11/04/2023    4:24 PM 10/30/2022    9:03 AM 10/14/2021    8:32 AM 08/29/2020    9:38 AM 10/11/2019    7:45 AM 08/24/2019    9:39 AM 08/13/2017    9:36 AM  Advanced Directives  Does Patient Have a Medical Advance Directive? Yes No Yes Yes Yes No No  Type of Estate agent of [place];Living will  Healthcare Power of [place];Living will Healthcare Power of [place];Living will Healthcare Power of [place];Living will    Does  patient want to make changes to medical advance directive? No - Patient declined        Copy of Healthcare Power of Attorney in Chart? Yes - validated most recent copy scanned in chart (See row information)  No - copy requested No - copy requested No - copy requested    Would patient like information on creating a medical advance directive?  No - Patient declined    No - Patient declined No - Patient declined    Current Medications (verified) Outpatient Encounter Medications as of 11/04/2023  Medication Sig   acetaminophen  (TYLENOL ) 325 MG tablet Take 650 mg by mouth every 6 (six) hours as needed.   esomeprazole  (NEXIUM ) 40 MG capsule Take 1 capsule (40 mg total) by mouth daily.   fluticasone  (FLONASE ) 50 MCG/ACT nasal spray Place 2 sprays into both nostrils daily.   latanoprost (XALATAN) 0.005 % ophthalmic solution Apply to eye. Eye doctor   Lutein 20 MG TABS Take 20 mg by mouth at bedtime.   meloxicam  (MOBIC ) 15 MG tablet Take 1 tablet (15 mg total) by mouth daily.   metoprolol  succinate (TOPROL -XL) 50 MG 24 hr tablet TAKE 1 TABLET BY MOUTH ONCE DAILY. TAKE WITH OR IMMEDIATELY FOLLOWING MEAL   Multiple Vitamins-Minerals (MULTIVITAMIN ADULT PO) Take 1 tablet by mouth daily.    Omega-3 Fatty Acids (FISH OIL PO) Take 1 capsule by mouth daily.    primidone (MYSOLINE) 50 MG tablet Take 1 tablet by mouth 2 (two) times daily.   Probiotic Product (PROBIOTIC-10 PO)    No facility-administered  encounter medications on file as of 11/04/2023.    Allergies (verified) Patient has no known allergies.   History: Past Medical History:  Diagnosis Date   Heart murmur    Hypertension    Past Surgical History:  Procedure Laterality Date   COLONOSCOPY     COLONOSCOPY WITH PROPOFOL  N/A 10/11/2019   Procedure: COLONOSCOPY WITH PROPOFOL ;  Surgeon: Marnee Sink, MD;  Location: [facility] ENDOSCOPY;  Service: Endoscopy;  Laterality: N/A;   ERCP N/A 08/13/2017   Procedure: ENDOSCOPIC RETROGRADE  CHOLANGIOPANCREATOGRAPHY (ERCP);  Surgeon: Marnee Sink, MD;  Location: [facility] END[facility]COPY;  Service: Endoscopy;  Laterality: N/A;   EYE SURGERY     gall stones     HERNIA REPAIR     ROBOTIC ASSISTED LAPAROSCOPIC CHOLECYSTECTOMY-MULTI SITE N/A 10/20/2017   Procedure: ROBOTIC ASSISTED LAPAROSCOPIC CHOLECYSTECTOMY-MULTI SITE;  Surgeon: Alben Alma, MD;  Location: [facility] ORS;  Service: General;  Laterality: N/A;   UMBILICAL HERNIA REPAIR N/A 10/20/2017   Procedure: HERNIA REPAIR UMBILICAL ADULT;  Surgeon: Alben Alma, MD;  Location: [facility] ORS;  Service: General;  Laterality: N/A;   Family History  Problem Relation Age of Onset   Heart disease Mother    Diabetes Mother    Stroke Mother    Cancer Mother    Heart disease Father    COPD Father    Stroke Father    Lung cancer Brother    COPD Brother    Cancer Brother    Cancer Maternal Grandmother    Heart disease Maternal Grandmother    Stroke Maternal Grandmother    Cancer Maternal Grandfather    Diabetes Maternal Grandfather    Heart disease Maternal Grandfather    Social History   Socioeconomic History   Marital status: Married    Spouse name: Enola Klingbeil   Number of children: 2   Years of education: Not on file   Highest education level: Not on file  Occupational History   Occupation: retired  Tobacco Use   Smoking status: Former    Current packs/day: 0.00    Average packs/day: 1 pack/day for 6.0 years (6.0 ttl pk-yrs)    Types: Cigarettes    Start date: 32    Quit date: 1980    Years since quitting: 45.4    Passive exposure: Past   Smokeless tobacco: Never   Tobacco comments:    quit 40 yrs  Vaping Use   Vaping status: Never Used  Substance and Sexual Activity   Alcohol use: Yes    Alcohol/week: 3.0 standard drinks of alcohol    Types: 3 Glasses of wine per week    Comment: 1 wine cooler 3-4 times per week   Drug use: No   Sexual activity: Yes  Other Topics Concern   Not on file  Social History Narrative    Not on file   Social Drivers of Health   Financial Resource Strain: Low Risk  (11/04/2023)   Overall Financial Resource Strain (CARDIA)    Difficulty of Paying Living Expenses: Not hard at all  Food Insecurity: No Food Insecurity (11/04/2023)   Hunger Vital Sign    Worried About Running Out of Food in the Last Year: Never true    Ran Out of Food in the Last Year: Never true  Transportation Needs: No Transportation Needs (11/04/2023)   PRAPARE - Administrator, Civil Service (Medical): No    Lack of Transportation (Non-Medical): No  Physical Activity: Inactive (11/04/2023)   Exercise Vital Sign  Days of Exercise per Week: 0 days    Minutes of Exercise per Session: 0 min  Stress: No Stress Concern Present (11/04/2023)   [employer] of Occupational Health - Occupational Stress Questionnaire    Feeling of Stress : Not at all  Social Connections: Moderately Integrated (11/04/2023)   Social Connection and Isolation Panel [NHANES]    Frequency of Communication with Friends and Family: More than three times a week    Frequency of Social Gatherings with Friends and Family: Twice a week    Attends Religious Services: Never    Database administrator or Organizations: Yes    Attends Engineer, structural: More than 4 times per year    Marital Status: Married    Tobacco Counseling Counseling given: Not Answered Tobacco comments: quit 40 yrs    Clinical Intake:  Pre-visit preparation completed: Yes  Pain : No/denies pain     BMI - recorded: 28.13 Nutritional Status: BMI 25 -29 Overweight Nutritional Risks: None Diabetes: No  No results found for: HGBA1C   How often do you need to have someone help you when you read instructions, pamphlets, or other written materials from your doctor or pharmacy?: 1 - Never  Interpreter Needed?: No  Information entered by :: Jaunita Messier, CMA   Activities of Daily Living     11/04/2023    4:09 PM  In your  present state of health, do you have any difficulty performing the following activities:  Hearing? 1  Comment wears hearing aids  Vision? 0  Difficulty concentrating or making decisions? 0  Walking or climbing stairs? 0  Dressing or bathing? 0  Doing errands, shopping? 0  Preparing Food and eating ? N  Using the Toilet? N  In the past six months, have you accidently leaked urine? N  Do you have problems with loss of bowel control? N  Managing your Medications? N  Managing your Finances? N  Housekeeping or managing your Housekeeping? N    Patient Care Team: Barnetta Liberty, MD as PCP - [facility] (Internal Medicine[facility], [facility] (Optometry) [place], Valeda Garter (Neurology) Bufford Carne, MD as Referring Physician (Neurology)  I have updated your Care Teams any recent Medical Services you may have received from other providers in the past year.     Assessment:    This is a routine wellness examination for Carlito.  Hearing/Vision screen Hearing Screening - Comments:: Wears hearing aids Vision Screening - Comments:: Gets routine eye exams, Rosezena Contes, Tyrone Gallop [place]   Goals Addressed               This Visit's Progress     Weight (lb) < 180 lb (81.6 kg) (pt-stated)   185 lb (83.9 kg)      Depression Screen     11/04/2023    4:21 PM 10/01/2023   10:11 AM 04/09/2023   10:57 AM 10/30/2022    9:02 AM 10/06/2022   10:30 AM 09/26/2022    9:03 AM 09/05/2022    3:15 PM  PHQ 2/9 Scores  PHQ - 2 Score 0 0 0 0 0 0 0  PHQ- 9 Score 0 0  0 0 0 0    Fall Risk     11/04/2023    4:26 PM 10/01/2023   10:11 AM 04/09/2023   10:57 AM 10/30/2022    9:04 AM 10/06/2022   10:30 AM  Fall Risk   Falls in the past year? 0 0 0 0  0  Number falls in past yr: 0 0 0 0 0  Injury with Fall? 0 0 0 0 0  Risk for fall due to : No Fall Risks No Fall Risks  No Fall Risks No Fall Risks  Follow up Falls evaluation completed Falls evaluation completed  Falls prevention discussed;Falls  evaluation completed Falls evaluation completed    MEDICARE RISK AT HOME:  Medicare Risk at Home Any stairs in or around the home?: Yes If so, are there any without handrails?: No Home free of loose throw rugs in walkways, pet beds, electrical cords, etc?: Yes Adequate lighting in your home to reduce risk of falls?: Yes Life alert?: No Use of a cane, walker or w/c?: No Grab bars in the bathroom?: Yes Shower chair or bench in shower?: No Elevated toilet seat or a handicapped toilet?: Yes  TIMED UP AND GO:  Was the test performed?  No  Cognitive Function: 6CIT completed        11/04/2023    4:27 PM 10/30/2022    9:07 AM 08/29/2020    9:42 AM  6CIT Screen  What Year? 0 points 0 points 0 points  What month? 0 points 0 points 0 points  What time? 0 points 0 points 0 points  Count back from 20 0 points 0 points 0 points  Months in reverse 0 points 0 points 0 points  Repeat phrase 2 points 0 points 0 points  Total Score 2 points 0 points 0 points    Immunizations Immunization History  Administered Date(s) Administered   Hepatitis A, Adult 04/01/1997, 04/28/1998   Influenza Whole 02/24/1996, 03/27/1996, 03/31/1997, 04/01/1998, 04/27/1999, 03/28/2000, 04/11/2001, 04/11/2002, 06/03/2003   Influenza,inj,Quad PF,6+ Mos 03/16/2014   MMR 06/06/2000   Meningococcal polysaccharide vaccine (MPSV4) 04/28/1998   Moderna Sars-Covid-2 Vaccination 09/27/2019, 10/25/2019   OPV 06/26/1972, 06/28/1972   PPD Test 07/01/1998, 02/27/2001, 02/18/2002, 02/05/2003   Pneumococcal Conjugate-13 07/06/2018   Td 08/24/1992, 09/01/1992, 06/04/2002   Tdap 06/20/2013   Typhoid Inactivated 02/27/2001, 03/11/2003   Typhoid Parenteral 07/01/1998   Typhoid Parenteral, AKD ([place]  [employer]) 07/25/1995, 07/27/1995   Yellow Fever 06/26/1993, 07/06/1993, 06/01/1999    Screening Tests Health Maintenance  Topic Date Due   Zoster Vaccines- Shingrix (1 of 2) Never done   DTaP/Tdap/Td (5 - Td or Tdap) 06/21/2023    Pneumonia Vaccine 38+ Years old (2 of 2 - PPSV23) 04/08/2024 (Originally 07/07/2019)   INFLUENZA VACCINE  12/25/2023   Colonoscopy  10/10/2024   Medicare Annual Wellness (AWV)  11/03/2024   Hepatitis C Screening  Completed   HPV VACCINES  Aged Out   Meningococcal B Vaccine  Aged Out   COVID-19 Vaccine  Discontinued    Health Maintenance  Health Maintenance Due  Topic Date Due   Zoster Vaccines- Shingrix (1 of 2) Never done   DTaP/Tdap/Td (5 - Td or Tdap) 06/21/2023   Health Maintenance Items Addressed: See Nurse Notes at the end of this note  Additional Screening:  Vision Screening: Recommended annual ophthalmology exams for early detection of glaucoma and other disorders of the eye. Would you like a referral to an eye doctor? No    Dental Screening: Recommended annual dental exams for proper oral hygiene  Community Resource Referral / Chronic Care Management: CRR required this visit?  No   CCM required this visit?  No   Plan:    I have personally reviewed and noted the following in the patient's chart:   Medical and social history Use of alcohol, tobacco or  illicit drugs  Current medications and supplements including opioid prescriptions. Patient is not currently taking opioid prescriptions. Functional ability and status Nutritional status Physical activity Advanced directives List of other physicians Hospitalizations, surgeries, and ER visits in previous 12 months Vitals Screenings to include cognitive, depression, and falls Referrals and appointments  In addition, I have reviewed and discussed with patient certain preventive protocols, quality metrics, and best practice recommendations. A written personalized care plan for preventive services as well as general preventive health recommendations were provided to patient.   Jaunita Messier, CMA   11/04/2023   After Visit Summary: (MyChart) Due to this being a telephonic visit, the after visit summary with patients  personalized plan was offered to patient via MyChart   Notes: 6 CIT Score - 2 Declined Pneumonia, Covid and Shingles vaccines

## 2023-12-22 DIAGNOSIS — G250 Essential tremor: Secondary | ICD-10-CM | POA: Diagnosis not present

## 2023-12-22 DIAGNOSIS — G3184 Mild cognitive impairment, so stated: Secondary | ICD-10-CM | POA: Diagnosis not present

## 2023-12-22 DIAGNOSIS — Z1331 Encounter for screening for depression: Secondary | ICD-10-CM | POA: Diagnosis not present

## 2023-12-28 DIAGNOSIS — Z791 Long term (current) use of non-steroidal anti-inflammatories (NSAID): Secondary | ICD-10-CM | POA: Diagnosis not present

## 2023-12-28 DIAGNOSIS — K219 Gastro-esophageal reflux disease without esophagitis: Secondary | ICD-10-CM | POA: Diagnosis not present

## 2023-12-28 DIAGNOSIS — Z833 Family history of diabetes mellitus: Secondary | ICD-10-CM | POA: Diagnosis not present

## 2023-12-28 DIAGNOSIS — N189 Chronic kidney disease, unspecified: Secondary | ICD-10-CM | POA: Diagnosis not present

## 2023-12-28 DIAGNOSIS — H409 Unspecified glaucoma: Secondary | ICD-10-CM | POA: Diagnosis not present

## 2023-12-28 DIAGNOSIS — I129 Hypertensive chronic kidney disease with stage 1 through stage 4 chronic kidney disease, or unspecified chronic kidney disease: Secondary | ICD-10-CM | POA: Diagnosis not present

## 2023-12-28 DIAGNOSIS — M1990 Unspecified osteoarthritis, unspecified site: Secondary | ICD-10-CM | POA: Diagnosis not present

## 2023-12-28 DIAGNOSIS — I872 Venous insufficiency (chronic) (peripheral): Secondary | ICD-10-CM | POA: Diagnosis not present

## 2023-12-28 DIAGNOSIS — Z809 Family history of malignant neoplasm, unspecified: Secondary | ICD-10-CM | POA: Diagnosis not present

## 2023-12-28 DIAGNOSIS — G250 Essential tremor: Secondary | ICD-10-CM | POA: Diagnosis not present

## 2024-01-13 ENCOUNTER — Encounter: Payer: Self-pay | Admitting: Student

## 2024-01-21 ENCOUNTER — Ambulatory Visit (INDEPENDENT_AMBULATORY_CARE_PROVIDER_SITE_OTHER): Admitting: Student

## 2024-01-21 ENCOUNTER — Encounter: Payer: Self-pay | Admitting: Student

## 2024-01-21 VITALS — BP 128/76 | HR 62 | Ht 68.0 in | Wt 186.0 lb

## 2024-01-21 DIAGNOSIS — R0683 Snoring: Secondary | ICD-10-CM

## 2024-01-21 NOTE — Progress Notes (Unsigned)
 Established Patient Office Visit  Subjective   Patient ID: Derek Figueroa, male    DOB: 26-Sep-1950  Age: 73 y.o. MRN: [id number]  Chief Complaint  Patient presents with   Sleeping Problem    Snoring, sometimes 1 -2 times wakes up at night    Derek Figueroa with medical hx listed below presents today for snoring. Has been snoring for years. Wife has been notice snoring which is making it difficult for there to sleep. He reports sleeping well, wife has not noticed apneic events but has not been paying attention.   Patient Active Problem List   Diagnosis Date Noted   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   Tendinopathy of right rotator cuff 04/09/2022   History of colonic polyps    Polyp of transverse colon    Cholecystitis, acute    Obstruction of bile duct    Jaundice    Abnormal findings on imaging of biliary tract    Residual hemorrhoidal skin tags 06/23/2014   Right shoulder pain 01/05/2014   Gastroesophageal reflux disease without esophagitis 01/05/2014   Elevated blood pressure 01/05/2014   History of adenomatous polyp of colon 05/26/2006      ROS Refer to HPI    Objective:     Outpatient Encounter Medications as of 01/21/2024  Medication Sig Note   acetaminophen  (TYLENOL ) 325 MG tablet Take 650 mg by mouth every 6 (six) hours as needed.    esomeprazole  (NEXIUM ) 40 MG capsule Take 1 capsule (40 mg total) by mouth daily. 11/04/2023: prn   fluticasone  (FLONASE ) 50 MCG/ACT nasal spray Place 2 sprays into both nostrils daily. 11/04/2023: prn   latanoprost (XALATAN) 0.005 % ophthalmic solution Apply to eye. Eye doctor    Lutein 20 MG TABS Take 20 mg by mouth at bedtime.    meloxicam  (MOBIC ) 15 MG tablet Take 1 tablet (15 mg total) by mouth daily. 11/04/2023: prn   metoprolol  succinate (TOPROL -XL) 50 MG 24 hr tablet TAKE 1 TABLET BY MOUTH ONCE DAILY. TAKE WITH OR IMMEDIATELY FOLLOWING MEAL    Multiple Vitamins-Minerals (MULTIVITAMIN ADULT PO) Take 1 tablet  by mouth daily.     Omega-3 Fatty Acids (FISH OIL PO) Take 1 capsule by mouth daily.     primidone (MYSOLINE) 50 MG tablet Take 1 tablet by mouth 2 (two) times daily.    Probiotic Product (PROBIOTIC-10 PO)     No facility-administered encounter medications on file as of 01/21/2024.    BP 134/76   Pulse 62   Ht 5' 8 (1.727 m)   Wt 186 lb (84.4 kg)   SpO2 96%   BMI 28.28 kg/m  BP Readings from Last 3 Encounters:  01/21/24 134/76  10/01/23 120/86  04/09/23 118/74    Physical Exam     11/04/2023    4:21 PM 10/01/2023   10:11 AM 04/09/2023   10:57 AM  Depression screen PHQ 2/9  Decreased Interest 0 0 0  Down, Depressed, Hopeless 0 0 0  PHQ - 2 Score 0 0 0  Altered sleeping 0 0   Tired, decreased energy 0 0   Change in appetite 0 0   Feeling bad or failure about yourself  0 0   Trouble concentrating 0 0   Moving slowly or fidgety/restless 0 0   Suicidal thoughts 0 0   PHQ-9 Score 0 0   Difficult doing work/chores Not difficult at all Not difficult at all        10/01/2023   10:11  AM 10/06/2022   10:31 AM 09/26/2022    9:03 AM 09/05/2022    3:16 PM  GAD 7 : Generalized Anxiety Score  Nervous, Anxious, on Edge 0 0 0 0  Control/stop worrying 0 0 0 0  Worry too much - different things 0 0 0 0  Trouble relaxing 0 0 0 0  Restless 0 0 0 0  Easily annoyed or irritable 0 0 0 0  Afraid - awful might happen 0 0 0 0  Total GAD 7 Score 0 0 0 0  Anxiety Difficulty Not difficult at all Not difficult at all Not difficult at all Not difficult at all    No results found for any visits on 01/21/24.  Last CBC Lab Results  Component Value Date   WBC 6.9 09/06/2021   HGB 15.1 09/06/2021   HCT 45.3 09/06/2021   MCV 87 09/06/2021   MCH 29.0 09/06/2021   RDW 12.9 09/06/2021   PLT 253 09/06/2021   Last metabolic panel Lab Results  Component Value Date   GLUCOSE 101 (H) 10/01/2023   NA 141 10/01/2023   K 4.5 10/01/2023   CL 104 10/01/2023   CO2 23 10/01/2023   BUN 13 10/01/2023    CREATININE 0.96 10/01/2023   EGFR 85 10/01/2023   CALCIUM 9.3 10/01/2023   PHOS 2.7 (L) 04/09/2023   PROT 6.4 10/01/2023   ALBUMIN 4.1 10/01/2023   LABGLOB 2.3 10/01/2023   AGRATIO 2.0 04/07/2022   BILITOT 0.2 10/01/2023   ALKPHOS 74 10/01/2023   AST 19 10/01/2023   ALT 17 10/01/2023   ANIONGAP 7 08/05/2017   Last lipids Lab Results  Component Value Date   CHOL 223 (H) 10/01/2023   HDL 54 10/01/2023   LDLCALC 146 (H) 10/01/2023   TRIG 128 10/01/2023   CHOLHDL 3.8 08/20/2018      The 10-year ASCVD risk score (Arnett DK, et al., 2019) is: 24.3%    Assessment & Plan:  There are no diagnoses linked to this encounter.   No follow-ups on file.    Harlene Saddler, MD

## 2024-01-22 DIAGNOSIS — R0683 Snoring: Secondary | ICD-10-CM | POA: Insufficient documentation

## 2024-01-22 DIAGNOSIS — G4730 Sleep apnea, unspecified: Secondary | ICD-10-CM | POA: Insufficient documentation

## 2024-01-22 NOTE — Assessment & Plan Note (Signed)
 STOP- BANG elevated for Snoring, hypertension, age, neck circumference, and gender. Otherwise does not have significant daytime sleepiness. Will refer to sleep medicine. Discussed behavioral modification including separated sleeping area with spouse.

## 2024-02-01 ENCOUNTER — Encounter: Payer: Self-pay | Admitting: Sleep Medicine

## 2024-02-01 ENCOUNTER — Ambulatory Visit (INDEPENDENT_AMBULATORY_CARE_PROVIDER_SITE_OTHER): Admitting: Sleep Medicine

## 2024-02-01 VITALS — BP 130/70 | HR 72 | Temp 98.5°F | Ht 68.0 in | Wt 183.6 lb

## 2024-02-01 DIAGNOSIS — I10 Essential (primary) hypertension: Secondary | ICD-10-CM | POA: Diagnosis not present

## 2024-02-01 DIAGNOSIS — G4733 Obstructive sleep apnea (adult) (pediatric): Secondary | ICD-10-CM | POA: Diagnosis not present

## 2024-02-01 DIAGNOSIS — Z87891 Personal history of nicotine dependence: Secondary | ICD-10-CM

## 2024-02-01 NOTE — Progress Notes (Signed)
 Name:Derek Figueroa MRN: [id number] DOB: 05-Jan-1951   CHIEF COMPLAINT:  EXCESSIVE DAYTIME SLEEPINESS   HISTORY OF PRESENT ILLNESS: Derek Figueroa is a 73 y.o. w/ a h/o HTN, GERD and glaucoma who present for c/o loud snoring and excessive daytime sleepiness which has been present for several years. Reports nocturnal awakenings due to nocturia, however does not have difficulty falling back to sleep. Denies any significant weight changes. Denies morning headaches, RLS symptoms, dream enactment, cataplexy, hypnagogic or hypnapompic hallucinations. Denies a family history of sleep apnea. Denies drowsy driving. Drinks 3-4 cups of coffee daily, occasional alcohol use, denies tobacco or illicit drug use.   Bedtime 11-11:30 pm Sleep onset 10 mins Rise time 6:30-7 am   EPWORTH SLEEP SCORE 2    02/01/2024    2:00 PM 01/21/2024   11:17 AM  Results of the Epworth flowsheet  Sitting and reading 1 0  Watching TV 1 2  Sitting, inactive in a public place (e.g. a theatre or a meeting) 0 0  As a passenger in a car for an hour without a break 0 0  Lying down to rest in the afternoon when circumstances permit 0 1  Sitting and talking to someone 0 0  Sitting quietly after a lunch without alcohol 0 0  In a car, while stopped for a few minutes in traffic 0 0  Total score 2 3    PAST MEDICAL HISTORY :   has a past medical history of Heart murmur and Hypertension.  has a past surgical history that includes Eye surgery; Hernia repair; ERCP (N/A, 08/13/2017); Robotic assisted laparoscopic cholecystectomy-multi site (N/A, 10/20/2017); Umbilical hernia repair (N/A, 10/20/2017); gall stones; Colonoscopy; and Colonoscopy with propofol  (N/A, 10/11/2019). Prior to Admission medications   Medication Sig Start Date End Date Taking? Authorizing Provider  acetaminophen  (TYLENOL ) 325 MG tablet Take 650 mg by mouth every 6 (six) hours as needed.   Yes [provider]  esomeprazole  (NEXIUM ) 40 MG capsule  Take 1 capsule (40 mg total) by mouth daily. 10/01/23  Yes Joshua Cathryne BROCKS, MD  fluticasone  (FLONASE ) 50 MCG/ACT nasal spray Place 2 sprays into both nostrils daily. 04/09/23  Yes Joshua Cathryne BROCKS, MD  latanoprost (XALATAN) 0.005 % ophthalmic solution Apply to eye. Eye doctor 03/27/20  Yes [provider]  Lutein 20 MG TABS Take 20 mg by mouth at bedtime.   Yes [provider]  meloxicam  (MOBIC ) 15 MG tablet Take 1 tablet (15 mg total) by mouth daily. 04/09/23  Yes Joshua Cathryne BROCKS, MD  metoprolol  succinate (TOPROL -XL) 50 MG 24 hr tablet TAKE 1 TABLET BY MOUTH ONCE DAILY. TAKE WITH OR IMMEDIATELY FOLLOWING MEAL 10/01/23  Yes Jones, Deanna C, MD  Multiple Vitamins-Minerals (MULTIVITAMIN ADULT PO) Take 1 tablet by mouth daily.    Yes [provider]  Omega-3 Fatty Acids (FISH OIL PO) Take 1 capsule by mouth daily.    Yes [provider]  primidone (MYSOLINE) 50 MG tablet Take 1 tablet by mouth 2 (two) times daily. 10/26/23  Yes [provider]  Probiotic Product (PROBIOTIC-10 PO)  09/14/22  Yes [provider]   No Known Allergies  FAMILY HISTORY:  family history includes COPD in his brother and father; Cancer in his brother, maternal grandfather, maternal grandmother, and mother; Diabetes in his maternal grandfather and mother; Heart disease in his father, maternal grandfather, maternal grandmother, and mother; Lung cancer in his brother; Stroke in his father, maternal grandmother, and mother. SOCIAL HISTORY:  reports that he quit smoking about 45 years ago. His smoking use included cigarettes. He started smoking about 51 years ago. He has a 6 pack-year smoking history. He has been exposed to tobacco smoke. He has never used smokeless tobacco. He reports current alcohol use of about 3.0 standard drinks of alcohol per week. He reports that he does not use drugs.   Review of Systems:  Gen:  Denies  fever, sweats, chills weight loss  HEENT: Denies blurred  vision, double vision, ear pain, eye pain, hearing loss, nose bleeds, sore throat Cardiac:  No dizziness, chest pain or heaviness, chest tightness,edema, No JVD Resp:   No cough, -sputum production, -shortness of breath,-wheezing, -hemoptysis,  Gi: Denies swallowing difficulty, stomach pain, nausea or vomiting, diarrhea, constipation, bowel incontinence Gu:  Denies bladder incontinence, burning urine Ext:   Denies Joint pain, stiffness or swelling Skin: Denies  skin rash, easy bruising or bleeding or hives Endoc:  Denies polyuria, polydipsia , polyphagia or weight change Psych:   Denies depression, insomnia or hallucinations  Other:  All other systems negative  VITAL SIGNS: BP 130/70   Pulse 72   Temp 98.5 F (36.9 C)   Ht 5' 8 (1.727 m)   Wt 183 lb 9.6 oz (83.3 kg)   SpO2 96%   BMI 27.92 kg/m    Physical Examination:   General Appearance: No distress  EYES PERRLA, EOM intact.   NECK Supple, No JVD Pulmonary: normal breath sounds, No wheezing.  CardiovascularNormal S1,S2.  No m/r/g.   Abdomen: Benign, Soft, non-tender. Skin:   warm, no rashes, no ecchymosis  Extremities: normal, no cyanosis, clubbing. Neuro:without focal findings,  speech normal  PSYCHIATRIC: Mood, affect within normal limits.   ASSESSMENT AND PLAN  OSA I suspect that OSA is likely present due to clinical presentation. Discussed the consequences of untreated sleep apnea. Advised not to drive drowsy for safety of patient and others. Will complete further evaluation with a home sleep study and follow up to review results.    HTN Stable, on current management. Following with PCP.    MEDICATION ADJUSTMENTS/LABS AND TESTS ORDERED: Recommend Sleep Study   Patient  satisfied with Plan of action and management. All questions answered  Follow up to review HST results and treatment plan.   I spent a total of 63 minutes reviewing chart data, face-to-face evaluation with the patient, counseling and  coordination of care as detailed above.    Kita Neace, M.D.  [facility]

## 2024-02-01 NOTE — Patient Instructions (Signed)
 SABRA

## 2024-02-09 ENCOUNTER — Encounter

## 2024-02-09 DIAGNOSIS — G4733 Obstructive sleep apnea (adult) (pediatric): Secondary | ICD-10-CM

## 2024-02-09 DIAGNOSIS — G4730 Sleep apnea, unspecified: Secondary | ICD-10-CM | POA: Diagnosis not present

## 2024-02-11 ENCOUNTER — Telehealth: Payer: Self-pay

## 2024-02-11 NOTE — Telephone Encounter (Signed)
 Copied from CRM [id number]. Topic: Appointments - Scheduling Inquiry for Clinic >> Feb 11, 2024  8:18 AM Derek Figueroa wrote: Reason for CRM: Patient is finished with sleep study and sent it out test. Patient would like to set up an appointment to get the results. Patient will wait for the office to contact patient [phone].

## 2024-02-11 NOTE — Telephone Encounter (Signed)
 I spoke with the patient. He sent the test back yesterday. I told him once we get the results back we will let him know. He knows this could take a week or 2.   Nothing further needed.

## 2024-02-11 NOTE — Telephone Encounter (Signed)
 [employer] website just stated equipment shipped on 02/03/24 should I have Delon to track the device

## 2024-02-11 NOTE — Telephone Encounter (Signed)
 Donzell can you see if the sleep study has been placed in the system for Dr. Jess to read? Thank you!

## 2024-02-17 DIAGNOSIS — G4733 Obstructive sleep apnea (adult) (pediatric): Secondary | ICD-10-CM | POA: Diagnosis not present

## 2024-02-18 ENCOUNTER — Ambulatory Visit: Payer: Self-pay

## 2024-02-22 ENCOUNTER — Ambulatory Visit (INDEPENDENT_AMBULATORY_CARE_PROVIDER_SITE_OTHER): Admitting: Student

## 2024-02-22 ENCOUNTER — Encounter: Payer: Self-pay | Admitting: Student

## 2024-02-22 VITALS — BP 136/84 | HR 88 | Ht 68.0 in | Wt 186.0 lb

## 2024-02-22 DIAGNOSIS — G4733 Obstructive sleep apnea (adult) (pediatric): Secondary | ICD-10-CM

## 2024-02-22 NOTE — Progress Notes (Signed)
 Established Patient Office Visit  Subjective   Patient ID: Derek Figueroa, male    DOB: 1950-12-12  Age: 73 y.o. MRN: [id number]  Chief Complaint  Patient presents with   Referral    Sleep referral next step,     [name] with medical hx listed below presents today for follow up of sleep apnea. Recently had sleep study from sleep medicine showing severe sleep apnea. Had questions regarding results and whether or not he need CPAP. Otherwise feeling well with no acute complaints.   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   Tendinopathy of right rotator cuff 04/09/2022   History of colonic polyps    Polyp of transverse colon    Cholecystitis, acute    Obstruction of bile duct    Abnormal findings on imaging of biliary tract    Residual hemorrhoidal skin tags 06/23/2014   Right shoulder pain 01/05/2014   Gastroesophageal reflux disease without esophagitis 01/05/2014   Elevated blood pressure 01/05/2014   History of adenomatous polyp of colon 05/26/2006      ROS Refer to HPI    Objective:     Outpatient Encounter Medications as of 02/22/2024  Medication Sig Note   acetaminophen  (TYLENOL ) 325 MG tablet Take 650 mg by mouth every 6 (six) hours as needed.    esomeprazole  (NEXIUM ) 40 MG capsule Take 1 capsule (40 mg total) by mouth daily. (Patient taking differently: Take 40 mg by mouth as needed.) 11/04/2023: prn   fluticasone  (FLONASE ) 50 MCG/ACT nasal spray Place 2 sprays into both nostrils daily. 11/04/2023: prn   latanoprost (XALATAN) 0.005 % ophthalmic solution Apply to eye. Eye doctor    Lutein 20 MG TABS Take 20 mg by mouth at bedtime.    meloxicam  (MOBIC ) 15 MG tablet Take 1 tablet (15 mg total) by mouth daily. 11/04/2023: prn   metoprolol  succinate (TOPROL -XL) 50 MG 24 hr tablet TAKE 1 TABLET BY MOUTH ONCE DAILY. TAKE WITH OR IMMEDIATELY FOLLOWING MEAL    Multiple  Vitamins-Minerals (MULTIVITAMIN ADULT PO) Take 1 tablet by mouth daily.     Omega-3 Fatty Acids (FISH OIL PO) Take 1 capsule by mouth daily.     primidone (MYSOLINE) 50 MG tablet Take 1 tablet by mouth 2 (two) times daily.    Probiotic Product (PROBIOTIC-10 PO)     No facility-administered encounter medications on file as of 02/22/2024.    BP 136/84   Pulse 88   Ht 5' 8 (1.727 m)   Wt 186 lb (84.4 kg)   SpO2 95%   BMI 28.28 kg/m  BP Readings from Last 3 Encounters:  02/22/24 136/84  02/01/24 130/70  01/21/24 128/76    Physical Exam Constitutional:      Appearance: Normal appearance.  HENT:     Head: Normocephalic and atraumatic.  Eyes:     Extraocular Movements: Extraocular movements intact.     Pupils: Pupils are equal, round, and reactive to light.  Cardiovascular:     Rate and Rhythm: Normal rate.  Pulmonary:     Effort: Pulmonary effort is normal.  Abdominal:     General: Abdomen is flat. There is no distension.     Palpations: Abdomen is soft.  Skin:    General: Skin is warm and dry.     Capillary Refill: Capillary refill takes less than 2 seconds.  Neurological:     General: No focal deficit present.  Mental Status: He is alert and oriented to person, place, and time.  Psychiatric:        Mood and Affect: Mood normal.        Behavior: Behavior normal.        02/22/2024    1:45 PM 01/21/2024   11:44 AM 11/04/2023    4:21 PM  Depression screen PHQ 2/9  Decreased Interest 0 0 0  Down, Depressed, Hopeless 0 0 0  PHQ - 2 Score 0 0 0  Altered sleeping 0 0 0  Tired, decreased energy 0 0 0  Change in appetite 0 0 0  Feeling bad or failure about yourself  0 0 0  Trouble concentrating 0 0 0  Moving slowly or fidgety/restless 0 0 0  Suicidal thoughts 0 0 0  PHQ-9 Score 0 0 0  Difficult doing work/chores Not difficult at all Not difficult at all Not difficult at all       02/22/2024    1:45 PM 01/21/2024   11:44 AM 10/01/2023   10:11 AM 10/06/2022   10:31  AM  GAD 7 : Generalized Anxiety Score  Nervous, Anxious, on Edge 0 0 0 0  Control/stop worrying 0 0 0 0  Worry too much - different things 0 0 0 0  Trouble relaxing 0 0 0 0  Restless 0 0 0 0  Easily annoyed or irritable 0 0 0 0  Afraid - awful might happen 0 0 0 0  Total GAD 7 Score 0 0 0 0  Anxiety Difficulty Not difficult at all Not difficult at all Not difficult at all Not difficult at all    No results found for any visits on 02/22/24.  Last CBC Lab Results  Component Value Date   WBC 6.9 09/06/2021   HGB 15.1 09/06/2021   HCT 45.3 09/06/2021   MCV 87 09/06/2021   MCH 29.0 09/06/2021   RDW 12.9 09/06/2021   PLT 253 09/06/2021   Last metabolic panel Lab Results  Component Value Date   GLUCOSE 101 (H) 10/01/2023   NA 141 10/01/2023   K 4.5 10/01/2023   CL 104 10/01/2023   CO2 23 10/01/2023   BUN 13 10/01/2023   CREATININE 0.96 10/01/2023   EGFR 85 10/01/2023   CALCIUM 9.3 10/01/2023   PHOS 2.7 (L) 04/09/2023   PROT 6.4 10/01/2023   ALBUMIN 4.1 10/01/2023   LABGLOB 2.3 10/01/2023   AGRATIO 2.0 04/07/2022   BILITOT 0.2 10/01/2023   ALKPHOS 74 10/01/2023   AST 19 10/01/2023   ALT 17 10/01/2023   ANIONGAP 7 08/05/2017   Last lipids Lab Results  Component Value Date   CHOL 223 (H) 10/01/2023   HDL 54 10/01/2023   LDLCALC 146 (H) 10/01/2023   TRIG 128 10/01/2023   CHOLHDL 3.8 08/20/2018   Last hemoglobin A1c No results found for: HGBA1C    The 10-year ASCVD risk score (Arnett DK, et al., 2019) is: 24.9%    Assessment & Plan:  Obstructive sleep apnea syndrome Assessment & Plan: Recent sleep study results on 9/16/02025 with severe sleep apnea with significant levles of desaturations. AHI was 45. Discussed results patient and recommendation for CPAP which he is agreeable to. He will message sleep medicine regarding this and set up 3 month follow up. Will send him a copy of sleep study results.      No follow-ups on file.    Harlene Saddler, MD

## 2024-02-22 NOTE — Assessment & Plan Note (Addendum)
 Recent sleep study results on 9/16/02025 with severe sleep apnea with significant levles of desaturations. AHI was 45. Discussed results patient and recommendation for CPAP which he is agreeable to. He will message sleep medicine regarding this and set up 3 month follow up. Will send him a copy of sleep study results.

## 2024-02-23 DIAGNOSIS — G250 Essential tremor: Secondary | ICD-10-CM | POA: Diagnosis not present

## 2024-02-23 DIAGNOSIS — G3184 Mild cognitive impairment, so stated: Secondary | ICD-10-CM | POA: Diagnosis not present

## 2024-02-23 NOTE — Telephone Encounter (Signed)
 Pt has appt 10/6 to review sleep study results.

## 2024-02-29 ENCOUNTER — Encounter: Payer: Self-pay | Admitting: Sleep Medicine

## 2024-02-29 ENCOUNTER — Ambulatory Visit (INDEPENDENT_AMBULATORY_CARE_PROVIDER_SITE_OTHER): Admitting: Sleep Medicine

## 2024-02-29 VITALS — BP 130/80 | HR 56 | Temp 98.4°F | Ht 68.0 in | Wt 185.6 lb

## 2024-02-29 DIAGNOSIS — I10 Essential (primary) hypertension: Secondary | ICD-10-CM | POA: Diagnosis not present

## 2024-02-29 DIAGNOSIS — G4733 Obstructive sleep apnea (adult) (pediatric): Secondary | ICD-10-CM

## 2024-02-29 NOTE — Progress Notes (Signed)
 Name:Derek Figueroa MRN: [id number] DOB: 1951/02/22   CHIEF COMPLAINT:  HST F/U   HISTORY OF PRESENT ILLNESS: Derek Figueroa is a 73 y.o. w/ a h/o HTN, GERD and glaucoma who presents for HST F/U. The patient underwent HST which revealed severe OSA (AHI 45, O2 nadir 87%).    EPWORTH SLEEP SCORE     02/01/2024    2:00 PM 01/21/2024   11:17 AM  Results of the Epworth flowsheet  Sitting and reading 1 0  Watching TV 1 2  Sitting, inactive in a public place (e.g. a theatre or a meeting) 0 0  As a passenger in a car for an hour without a break 0 0  Lying down to rest in the afternoon when circumstances permit 0 1  Sitting and talking to someone 0 0  Sitting quietly after a lunch without alcohol 0 0  In a car, while stopped for a few minutes in traffic 0 0  Total score 2 3    PAST MEDICAL HISTORY :   has a past medical history of Heart murmur and Hypertension.  has a past surgical history that includes Eye surgery; Hernia repair; ERCP (N/A, 08/13/2017); Robotic assisted laparoscopic cholecystectomy-multi site (N/A, 10/20/2017); Umbilical hernia repair (N/A, 10/20/2017); gall stones; Colonoscopy; and Colonoscopy with propofol  (N/A, 10/11/2019). Prior to Admission medications   Medication Sig Start Date End Date Taking? Authorizing Provider  acetaminophen  (TYLENOL ) 325 MG tablet Take 650 mg by mouth every 6 (six) hours as needed.   Yes [provider]  esomeprazole  (NEXIUM ) 40 MG capsule Take 1 capsule (40 mg total) by mouth daily. 10/01/23  Yes Joshua Cathryne BROCKS, MD  fluticasone  (FLONASE ) 50 MCG/ACT nasal spray Place 2 sprays into both nostrils daily. 04/09/23  Yes Joshua Cathryne BROCKS, MD  latanoprost (XALATAN) 0.005 % ophthalmic solution Apply to eye. Eye doctor 03/27/20  Yes [provider]  Lutein 20 MG TABS Take 20 mg by mouth at bedtime.   Yes [provider]  meloxicam  (MOBIC ) 15 MG tablet Take 1 tablet (15 mg total) by mouth daily. 04/09/23  Yes Joshua Cathryne BROCKS, MD  metoprolol  succinate (TOPROL -XL) 50 MG 24 hr tablet TAKE 1 TABLET BY MOUTH ONCE DAILY. TAKE WITH OR IMMEDIATELY FOLLOWING MEAL 10/01/23  Yes Jones, Deanna C, MD  Multiple Vitamins-Minerals (MULTIVITAMIN ADULT PO) Take 1 tablet by mouth daily.    Yes [provider]  Omega-3 Fatty Acids (FISH OIL PO) Take 1 capsule by mouth daily.    Yes [provider]  primidone (MYSOLINE) 50 MG tablet Take 1 tablet by mouth 2 (two) times daily. 10/26/23  Yes [provider]  Probiotic Product (PROBIOTIC-10 PO)  09/14/22  Yes [provider]   No Known Allergies  FAMILY HISTORY:  family history includes COPD in his brother and father; Cancer in his brother, maternal grandfather, maternal grandmother, and mother; Diabetes in his maternal grandfather and mother; Heart disease in his father, maternal grandfather, maternal grandmother, and mother; Lung cancer in his brother; Stroke in his father, maternal grandmother, and mother. SOCIAL HISTORY:  reports that he quit smoking about 45 years ago. His smoking use included cigarettes. He started smoking about 51 years ago. He has a 6 pack-year smoking history. He has been exposed to tobacco smoke. He has never used smokeless tobacco. He reports current alcohol use of about 3.0 standard drinks of alcohol per week. He reports that he does not use drugs.   Review of Systems:  Gen:  Denies  fever, sweats, chills weight loss  HEENT: Denies blurred vision, double vision, ear pain, eye pain, hearing loss, nose bleeds, sore throat Cardiac:  No dizziness, chest pain or heaviness, chest tightness,edema, No JVD Resp:   No cough, -sputum production, -shortness of breath,-wheezing, -hemoptysis,  Gi: Denies swallowing difficulty, stomach pain, nausea or vomiting, diarrhea, constipation, bowel incontinence Gu:  Denies bladder incontinence, burning urine Ext:   Denies Joint pain, stiffness or swelling Skin: Denies  skin rash, easy  bruising or bleeding or hives Endoc:  Denies polyuria, polydipsia , polyphagia or weight change Psych:   Denies depression, insomnia or hallucinations  Other:  All other systems negative  VITAL SIGNS: BP 130/80   Pulse (!) 56   Temp 98.4 F (36.9 C)   Ht 5' 8 (1.727 m)   Wt 185 lb 9.6 oz (84.2 kg)   SpO2 96%   BMI 28.22 kg/m     Physical Examination:   General Appearance: No distress  EYES PERRLA, EOM intact.   NECK Supple, No JVD Pulmonary: normal breath sounds, No wheezing.  CardiovascularNormal S1,S2.  No m/r/g.   Abdomen: Benign, Soft, non-tender. Skin:   warm, no rashes, no ecchymosis  Extremities: normal, no cyanosis, clubbing. Neuro:without focal findings,  speech normal  PSYCHIATRIC: Mood, affect within normal limits.   ASSESSMENT AND PLAN  OSA Reviewed HST results with patient. Starting on APAP therapy set to 4-16 cm H2O. Discussed the consequences of untreated sleep apnea. Advised not to drive drowsy for safety of patient and others. Will follow up in 3 months.    HTN Stable, on current management. Following with PCP.    Patient  satisfied with Plan of action and management. All questions answered  I spent a total of 23 minutes reviewing chart data, face-to-face evaluation with the patient, counseling and coordination of care as detailed above.    [name], M.D.  Sleep Medicine [facility]

## 2024-02-29 NOTE — Patient Instructions (Signed)
 Patient Instructions  Continue to use CPAP every night, minimum of 4-6 hours a night.  Change equipment every 30 days or as directed by DME.  Wash your tubing with warm soap and water weekly, hang to dry. Wash humidifier portion weekly. Use distilled water and change daily   Be aware of reduced alertness and do not drive or operate heavy machinery if experiencing this or drowsiness.  Exercise encouraged, as tolerated. Encouraged proper weight management.  Important to get eight or more hours of sleep  Limiting the use of the computer and television before bedtime.  Decrease naps during the day, so night time sleep will become enhanced.  Limit caffeine, and sleep deprivation.  HTN, stroke, uncontrolled diabetes and heart failure are potential risk factors.  Risk of untreated sleep apnea including cardiac arrhthymias, stroke, DM, pulm HTN.

## 2024-03-01 DIAGNOSIS — H35372 Puckering of macula, left eye: Secondary | ICD-10-CM | POA: Diagnosis not present

## 2024-03-01 DIAGNOSIS — H40023 Open angle with borderline findings, high risk, bilateral: Secondary | ICD-10-CM | POA: Diagnosis not present

## 2024-03-01 DIAGNOSIS — H2513 Age-related nuclear cataract, bilateral: Secondary | ICD-10-CM | POA: Diagnosis not present

## 2024-03-01 DIAGNOSIS — H35363 Drusen (degenerative) of macula, bilateral: Secondary | ICD-10-CM | POA: Diagnosis not present

## 2024-03-02 ENCOUNTER — Encounter: Payer: Self-pay | Admitting: Student

## 2024-03-02 DIAGNOSIS — G250 Essential tremor: Secondary | ICD-10-CM

## 2024-03-02 DIAGNOSIS — I10 Essential (primary) hypertension: Secondary | ICD-10-CM

## 2024-03-02 MED ORDER — METOPROLOL SUCCINATE ER 50 MG PO TB24: ORAL_TABLET | ORAL | 1 refills | Status: DC

## 2024-03-02 NOTE — Telephone Encounter (Signed)
 Good morning,   We went ahead and sent in the refill in for the metoprolol . You may pick it up when you are ready.

## 2024-03-18 DIAGNOSIS — G4733 Obstructive sleep apnea (adult) (pediatric): Secondary | ICD-10-CM | POA: Diagnosis not present

## 2024-03-25 ENCOUNTER — Encounter: Payer: Self-pay | Admitting: Neurology

## 2024-03-28 ENCOUNTER — Ambulatory Visit (INDEPENDENT_AMBULATORY_CARE_PROVIDER_SITE_OTHER): Admitting: Student

## 2024-03-28 ENCOUNTER — Encounter: Payer: Self-pay | Admitting: Student

## 2024-03-28 VITALS — BP 136/86 | HR 59 | Ht 68.0 in | Wt 182.0 lb

## 2024-03-28 DIAGNOSIS — E7800 Pure hypercholesterolemia, unspecified: Secondary | ICD-10-CM | POA: Diagnosis not present

## 2024-03-28 DIAGNOSIS — G250 Essential tremor: Secondary | ICD-10-CM

## 2024-03-28 DIAGNOSIS — G4733 Obstructive sleep apnea (adult) (pediatric): Secondary | ICD-10-CM

## 2024-03-28 DIAGNOSIS — E785 Hyperlipidemia, unspecified: Secondary | ICD-10-CM | POA: Insufficient documentation

## 2024-03-28 DIAGNOSIS — Z860101 Personal history of adenomatous and serrated colon polyps: Secondary | ICD-10-CM | POA: Diagnosis not present

## 2024-03-28 DIAGNOSIS — K219 Gastro-esophageal reflux disease without esophagitis: Secondary | ICD-10-CM | POA: Diagnosis not present

## 2024-03-28 DIAGNOSIS — I10 Essential (primary) hypertension: Secondary | ICD-10-CM | POA: Diagnosis not present

## 2024-03-28 MED ORDER — LOSARTAN POTASSIUM 25 MG PO TABS: 25.0000 mg | ORAL_TABLET | Freq: Every day | ORAL | 1 refills | Status: DC

## 2024-03-28 MED ORDER — ATORVASTATIN CALCIUM 40 MG PO TABS: 40.0000 mg | ORAL_TABLET | Freq: Every day | ORAL | 1 refills | Status: DC

## 2024-03-28 NOTE — Assessment & Plan Note (Addendum)
 Stopped primidone 3 weeks due to lack of efficacy. Has upcoming visit with movement specialist in [place].

## 2024-03-28 NOTE — Patient Instructions (Addendum)
 Check [email or web address] for blood pressure cuff recommendations.  Decrease metoprolol  25 mg daily for 2 weeks and then stop Start losartan 25 mg daily for blood pressure  Please a keep a BP log and bring this to you next appointment.

## 2024-03-28 NOTE — Assessment & Plan Note (Signed)
 Recently got CPAP machine and using it daily, encouraged him to continue this.

## 2024-03-28 NOTE — Assessment & Plan Note (Signed)
 Has not needed Nexium  in the last 6 month. Monitor off medication.

## 2024-03-28 NOTE — Assessment & Plan Note (Addendum)
 Is currently on metoprolol  50 mg once daily. Denis dizziness, unsteadiness, falls, or LOC. He is eating a low salt diet. Decrease metoprolol  to 25 mg daily and start losartan 25 mg daily. Pt will keep log, follow up in 1 month

## 2024-03-28 NOTE — Progress Notes (Signed)
 Established Patient Office Visit  Subjective   Patient ID: Derek Figueroa, male    DOB: Oct 18, 1950  Age: 73 y.o. MRN: [id number]  Chief Complaint  Patient presents with   Establish Care    Patient presents today to establish care. He is doing well with no concerns for today's visit.     Derek Figueroa with medical hx listed below presents today for transfer of care. No acute complaints today. Please refer to problem based charting for further details and assessment and plan of current problem and chronic medical conditions.  Patient Active Problem List   Diagnosis Date Noted   Essential tremor 03/28/2024   Hypertension 03/28/2024   HLD (hyperlipidemia) 03/28/2024   Sleep apnea 01/22/2024   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   Tendinopathy of right rotator cuff 04/09/2022   History of colonic polyps    Residual hemorrhoidal skin tags 06/23/2014   Gastroesophageal reflux disease without esophagitis 01/05/2014   History of adenomatous polyp of colon 05/26/2006      ROS Refer to HPI    Objective:     Outpatient Encounter Medications as of 03/28/2024  Medication Sig Note   acetaminophen  (TYLENOL ) 325 MG tablet Take 650 mg by mouth every 6 (six) hours as needed.    atorvastatin (LIPITOR) 40 MG tablet Take 1 tablet (40 mg total) by mouth daily.    latanoprost (XALATAN) 0.005 % ophthalmic solution Apply to eye. Eye doctor    Lutein 20 MG TABS Take 20 mg by mouth at bedtime.    metoprolol  succinate (TOPROL -XL) 50 MG 24 hr tablet TAKE 1 TABLET BY MOUTH ONCE DAILY. TAKE WITH OR IMMEDIATELY FOLLOWING MEAL    Multiple Vitamins-Minerals (MULTIVITAMIN ADULT PO) Take 1 tablet by mouth daily.     Omega-3 Fatty Acids (FISH OIL PO) Take 1 capsule by mouth daily.     Probiotic Product (PROBIOTIC-10 PO)     Psyllium (EQ DAILY FIBER) 25 % POWD     [DISCONTINUED] esomeprazole  (NEXIUM ) 40 MG capsule Take 1 capsule (40 mg total) by mouth daily. (Patient taking differently:  Take 40 mg by mouth as needed.) 11/04/2023: prn   [DISCONTINUED] losartan (COZAAR) 25 MG tablet Take 1 tablet (25 mg total) by mouth daily.    losartan (COZAAR) 25 MG tablet Take 1 tablet (25 mg total) by mouth daily.    No facility-administered encounter medications on file as of 03/28/2024.    BP 136/86   Pulse (!) 59   Ht 5' 8 (1.727 m)   Wt 182 lb (82.6 kg)   SpO2 95%   BMI 27.67 kg/m  BP Readings from Last 3 Encounters:  03/28/24 136/86  02/29/24 130/80  02/22/24 136/84    Physical Exam Constitutional:      Appearance: Normal appearance.  HENT:     Head: Normocephalic and atraumatic.  Eyes:     Extraocular Movements: Extraocular movements intact.     Pupils: Pupils are equal, round, and reactive to light.  Cardiovascular:     Rate and Rhythm: Normal rate and regular rhythm.  Pulmonary:     Effort: Pulmonary effort is normal.     Breath sounds: No rhonchi or rales.  Abdominal:     General: Abdomen is flat. Bowel sounds are normal. There is no distension.     Palpations: Abdomen is soft.     Tenderness: There is no abdominal tenderness.  Musculoskeletal:        General: Normal range of motion.  Right lower leg: No edema.     Left lower leg: No edema.  Skin:    General: Skin is warm and dry.     Capillary Refill: Capillary refill takes less than 2 seconds.  Neurological:     General: No focal deficit present.     Mental Status: He is alert and oriented to person, place, and time.  Psychiatric:        Mood and Affect: Mood normal.        Behavior: Behavior normal.        03/28/2024    9:56 AM 02/22/2024    1:45 PM 01/21/2024   11:44 AM  Depression screen PHQ 2/9  Decreased Interest 0 0 0  Down, Depressed, Hopeless 0 0 0  PHQ - 2 Score 0 0 0  Altered sleeping 0 0 0  Tired, decreased energy 0 0 0  Change in appetite 0 0 0  Feeling bad or failure about yourself  0 0 0  Trouble concentrating 0 0 0  Moving slowly or fidgety/restless 0 0 0  Suicidal  thoughts 0 0 0  PHQ-9 Score 0  0  0   Difficult doing work/chores Not difficult at all Not difficult at all Not difficult at all     Data saved with a previous flowsheet row definition       03/28/2024    9:56 AM 02/22/2024    1:45 PM 01/21/2024   11:44 AM 10/01/2023   10:11 AM  GAD 7 : Generalized Anxiety Score  Nervous, Anxious, on Edge 0 0 0 0  Control/stop worrying 0 0 0 0  Worry too much - different things 0 0 0 0  Trouble relaxing 0 0 0 0  Restless 0 0 0 0  Easily annoyed or irritable 0 0 0 0  Afraid - awful might happen 0 0 0 0  Total GAD 7 Score 0 0 0 0  Anxiety Difficulty Not difficult at all Not difficult at all Not difficult at all Not difficult at all    No results found for any visits on 03/28/24.  Last CBC Lab Results  Component Value Date   WBC 6.9 09/06/2021   HGB 15.1 09/06/2021   HCT 45.3 09/06/2021   MCV 87 09/06/2021   MCH 29.0 09/06/2021   RDW 12.9 09/06/2021   PLT 253 09/06/2021   Last metabolic panel Lab Results  Component Value Date   GLUCOSE 101 (H) 10/01/2023   NA 141 10/01/2023   K 4.5 10/01/2023   CL 104 10/01/2023   CO2 23 10/01/2023   BUN 13 10/01/2023   CREATININE 0.96 10/01/2023   EGFR 85 10/01/2023   CALCIUM 9.3 10/01/2023   PHOS 2.7 (L) 04/09/2023   PROT 6.4 10/01/2023   ALBUMIN 4.1 10/01/2023   LABGLOB 2.3 10/01/2023   AGRATIO 2.0 04/07/2022   BILITOT 0.2 10/01/2023   ALKPHOS 74 10/01/2023   AST 19 10/01/2023   ALT 17 10/01/2023   ANIONGAP 7 08/05/2017   Last lipids Lab Results  Component Value Date   CHOL 223 (H) 10/01/2023   HDL 54 10/01/2023   LDLCALC 146 (H) 10/01/2023   TRIG 128 10/01/2023   CHOLHDL 3.8 08/20/2018     The 10-year ASCVD risk score (Arnett DK, et al., 2019) is: 24.9%    Assessment & Plan:  History of adenomatous polyp of colon  Essential tremor Assessment & Plan: Stopped primidone 3 weeks due to lack of efficacy. Has upcoming visit with movement specialist  in [place].    Primary  hypertension Assessment & Plan: Is currently on metoprolol  50 mg once daily. Denis dizziness, unsteadiness, falls, or LOC. He is eating a low salt diet. Decrease metoprolol  to 25 mg daily and start losartan 25 mg daily. Pt will keep log, follow up in 1 month   Obstructive sleep apnea syndrome Assessment & Plan: Recently got CPAP machine and using it daily, encouraged him to continue this.    Gastroesophageal reflux disease without esophagitis Assessment & Plan: Has not needed Nexium  in the last 6 month. Monitor off medication.    Pure hypercholesterolemia Assessment & Plan: Family hx of of stroke in mother, father with hx of CABG, and one grandparent with stroke. The 10-year ASCVD risk score (Arnett DK, et al., 2019) is: 25.5%. Not currently on statin. Start atorvastatin 40 mg daily given elevated risk.    Other orders -     Atorvastatin Calcium; Take 1 tablet (40 mg total) by mouth daily.  Dispense: 30 tablet; Refill: 1 -     Losartan Potassium; Take 1 tablet (25 mg total) by mouth daily.  Dispense: 30 tablet; Refill: 1     Return in about 4 weeks (around 04/25/2024) for HTN.    Harlene Saddler, MD

## 2024-03-28 NOTE — Assessment & Plan Note (Addendum)
 Family hx of of stroke in mother, father with hx of CABG, and one grandparent with stroke. The 10-year ASCVD risk score (Arnett DK, et al., 2019) is: 25.5%. Not currently on statin. Start atorvastatin 40 mg daily given elevated risk.

## 2024-03-30 ENCOUNTER — Telehealth: Payer: Self-pay

## 2024-03-30 NOTE — Telephone Encounter (Signed)
 Please review

## 2024-03-30 NOTE — Telephone Encounter (Signed)
 Copied from CRM [id number]. Topic: Clinical - Medication Question >> Mar 30, 2024 11:24 AM Victoria B wrote: Reason for CRM: patient called in states he heard, med, atorvastatin (LIPITOR) 40 MG tablet is recalled. Patient wants cb to discuss

## 2024-03-31 NOTE — Telephone Encounter (Signed)
 Spoke with patient, stated he will contact pharmacy to obtain the information on the recalls.

## 2024-04-18 DIAGNOSIS — G4733 Obstructive sleep apnea (adult) (pediatric): Secondary | ICD-10-CM | POA: Diagnosis not present

## 2024-04-27 ENCOUNTER — Encounter: Payer: Self-pay | Admitting: Student

## 2024-04-27 ENCOUNTER — Ambulatory Visit (INDEPENDENT_AMBULATORY_CARE_PROVIDER_SITE_OTHER): Admitting: Student

## 2024-04-27 VITALS — BP 136/80 | HR 98 | Ht 68.0 in | Wt 187.0 lb

## 2024-04-27 DIAGNOSIS — I10 Essential (primary) hypertension: Secondary | ICD-10-CM | POA: Diagnosis not present

## 2024-04-27 MED ORDER — LOSARTAN POTASSIUM 50 MG PO TABS: 50.0000 mg | ORAL_TABLET | Freq: Every day | ORAL | 1 refills | Status: AC

## 2024-04-27 MED ORDER — LOSARTAN POTASSIUM 25 MG PO TABS: 25.0000 mg | ORAL_TABLET | Freq: Every day | ORAL | 1 refills | Status: DC

## 2024-04-27 NOTE — Assessment & Plan Note (Signed)
 He has not been checking blood pressure at home, does not have a cuff. Tolerating losartan  25 mg daily well. Stopped taking metoprolol . Denies dizziness, lightheadedness, HA, numbness, tingling, weakness. BP mildly elevated today with improvement to 136/80.  -increase losartan  50 mg daily -Labs ordered today -Stop metoprolol 

## 2024-04-27 NOTE — Progress Notes (Signed)
 Established Patient Office Visit  Subjective   Patient ID: Derek Figueroa, male    DOB: May 16, 1951  Age: 73 y.o. MRN: [id number]  Chief Complaint  Patient presents with   Hypertension    Derek Figueroa is a 73 y.o. person with medical hx listed below who presents today for HTN follow up. Feeling well today. No side effects to starting losartan .   Patient Active Problem List   Diagnosis Date Noted   Essential tremor 03/28/2024   Hypertension 03/28/2024   HLD (hyperlipidemia) 03/28/2024   Sleep apnea 01/22/2024   History of colonic polyps    Residual hemorrhoidal skin tags 06/23/2014   Gastroesophageal reflux disease without esophagitis 01/05/2014   History of adenomatous polyp of colon 05/26/2006      ROS Refer to HPI    Objective:     Outpatient Encounter Medications as of 04/27/2024  Medication Sig   acetaminophen  (TYLENOL ) 325 MG tablet Take 650 mg by mouth every 6 (six) hours as needed.   atorvastatin  (LIPITOR) 40 MG tablet Take 1 tablet (40 mg total) by mouth daily.   latanoprost (XALATAN) 0.005 % ophthalmic solution Apply to eye. Eye doctor   losartan  (COZAAR ) 50 MG tablet Take 1 tablet (50 mg total) by mouth daily.   Lutein 20 MG TABS Take 20 mg by mouth at bedtime.   Multiple Vitamins-Minerals (MULTIVITAMIN ADULT PO) Take 1 tablet by mouth daily.    Omega-3 Fatty Acids (FISH OIL PO) Take 1 capsule by mouth daily.    Probiotic Product (PROBIOTIC-10 PO)    Psyllium (EQ DAILY FIBER) 25 % POWD    [DISCONTINUED] losartan  (COZAAR ) 25 MG tablet Take 1 tablet (25 mg total) by mouth daily.   [DISCONTINUED] metoprolol  succinate (TOPROL -XL) 50 MG 24 hr tablet TAKE 1 TABLET BY MOUTH ONCE DAILY. TAKE WITH OR IMMEDIATELY FOLLOWING MEAL   [DISCONTINUED] losartan  (COZAAR ) 25 MG tablet Take 1 tablet (25 mg total) by mouth daily.   No facility-administered encounter medications on file as of 04/27/2024.    BP 136/80   Pulse 98   Ht 5' 8 (1.727 m)   Wt 187 lb  (84.8 kg)   SpO2 95%   BMI 28.43 kg/m  BP Readings from Last 3 Encounters:  04/27/24 136/80  03/28/24 136/86  02/29/24 130/80    Physical Exam Constitutional:      Appearance: Normal appearance.  HENT:     Head: Normocephalic and atraumatic.  Cardiovascular:     Rate and Rhythm: Normal rate and regular rhythm.     Pulses: Normal pulses.     Heart sounds: No murmur heard. Pulmonary:     Effort: Pulmonary effort is normal.     Breath sounds: No wheezing, rhonchi or rales.  Abdominal:     General: Abdomen is flat. Bowel sounds are normal. There is no distension.     Palpations: Abdomen is soft.     Tenderness: There is no abdominal tenderness.  Skin:    General: Skin is warm and dry.     Capillary Refill: Capillary refill takes less than 2 seconds.  Neurological:     General: No focal deficit present.     Mental Status: He is alert. Mental status is at baseline.  Psychiatric:        Mood and Affect: Mood normal.        Behavior: Behavior normal.        03/28/2024    9:56 AM 02/22/2024    1:45 PM 01/21/2024  11:44 AM  Depression screen PHQ 2/9  Decreased Interest 0 0 0  Down, Depressed, Hopeless 0 0 0  PHQ - 2 Score 0 0 0  Altered sleeping 0 0 0  Tired, decreased energy 0 0 0  Change in appetite 0 0 0  Feeling bad or failure about yourself  0 0 0  Trouble concentrating 0 0 0  Moving slowly or fidgety/restless 0 0 0  Suicidal thoughts 0 0 0  PHQ-9 Score 0  0  0   Difficult doing work/chores Not difficult at all Not difficult at all Not difficult at all     Data saved with a previous flowsheet row definition       03/28/2024    9:56 AM 02/22/2024    1:45 PM 01/21/2024   11:44 AM 10/01/2023   10:11 AM  GAD 7 : Generalized Anxiety Score  Nervous, Anxious, on Edge 0 0 0 0  Control/stop worrying 0 0 0 0  Worry too much - different things 0 0 0 0  Trouble relaxing 0 0 0 0  Restless 0 0 0 0  Easily annoyed or irritable 0 0 0 0  Afraid - awful might happen 0 0 0 0   Total GAD 7 Score 0 0 0 0  Anxiety Difficulty Not difficult at all Not difficult at all Not difficult at all Not difficult at all    No results found for any visits on 04/27/24.  Last CBC Lab Results  Component Value Date   WBC 6.9 09/06/2021   HGB 15.1 09/06/2021   HCT 45.3 09/06/2021   MCV 87 09/06/2021   MCH 29.0 09/06/2021   RDW 12.9 09/06/2021   PLT 253 09/06/2021   Last metabolic panel Lab Results  Component Value Date   GLUCOSE 101 (H) 10/01/2023   NA 141 10/01/2023   K 4.5 10/01/2023   CL 104 10/01/2023   CO2 23 10/01/2023   BUN 13 10/01/2023   CREATININE 0.96 10/01/2023   EGFR 85 10/01/2023   CALCIUM  9.3 10/01/2023   PHOS 2.7 (L) 04/09/2023   PROT 6.4 10/01/2023   ALBUMIN 4.1 10/01/2023   LABGLOB 2.3 10/01/2023   AGRATIO 2.0 04/07/2022   BILITOT 0.2 10/01/2023   ALKPHOS 74 10/01/2023   AST 19 10/01/2023   ALT 17 10/01/2023   ANIONGAP 7 08/05/2017      The 10-year ASCVD risk score (Arnett DK, et al., 2019) is: 24.9%    Assessment & Plan:  Primary hypertension Assessment & Plan: He has not been checking blood pressure at home, does not have a cuff. Tolerating losartan  25 mg daily well. Stopped taking metoprolol . Denies dizziness, lightheadedness, HA, numbness, tingling, weakness. BP mildly elevated today with improvement to 136/80.  -increase losartan  50 mg daily -Labs ordered today -Stop metoprolol    Orders: -     Basic metabolic panel with GFR  Other orders -     Losartan  Potassium; Take 1 tablet (50 mg total) by mouth daily.  Dispense: 90 tablet; Refill: 1     Return in about 5 months (around 09/25/2024) for physical.    Derek Saddler, MD

## 2024-04-28 LAB — BASIC METABOLIC PANEL WITH GFR
BUN/Creatinine Ratio: 11 (ref 10–24)
BUN: 11 mg/dL (ref 8–27)
CO2: 24 mmol/L (ref 20–29)
Calcium: 9 mg/dL (ref 8.6–10.2)
Chloride: 104 mmol/L (ref 96–106)
Creatinine, Ser: 0.99 mg/dL (ref 0.76–1.27)
Glucose: 138 mg/dL — ABNORMAL HIGH (ref 70–99)
Potassium: 4.4 mmol/L (ref 3.5–5.2)
Sodium: 138 mmol/L (ref 134–144)
eGFR: 81 mL/min/1.73 (ref >59)

## 2024-05-01 ENCOUNTER — Encounter: Payer: Self-pay | Admitting: Student

## 2024-05-02 NOTE — Telephone Encounter (Signed)
 Please review, medication not on current medication list.

## 2024-05-02 NOTE — Progress Notes (Unsigned)
 Assessment/Plan:   Assessment and Plan Assessment & Plan Essential tremor Chronic essential tremor, right hand dominant, worsened. Discussed DAT scan given some degree of rest tremor.  Pt doesn't meet clinical criteria currently for Parkinsons Disease.  Pt/wife decided to hold for now which is reasonable. He wants to restart primidone .    - Restarted primidone , titrate to 300 mg daily.  Following titration schedule given: Week 1:  primidone  50 mg at bed Week 2:  primidone  :  50 mg in the AM and bed Week 3:  primidone  50 mg, 1 in the AM, 2 at bed Week 4: primidone  50 mg, 2 in the AM and 2 at bed Week 5:  primidone , 50 mg, 2 in the AM, 3 at bed Week 6 and thereafter:  primidone , 50 mg, 3 in the AM and 3 at bed - Ordered thyroid function test. - Sent primidone  prescription to pharmacy. -Was on metoprolol , but stopped recently.  Discussed propranolol trial but he wants to hold as he just increased his losartan  - Discussed surgical interventions, although I do not think he needs that currently. - Decrease caffeine   HTN -on losarten  Memory change -Was not mentioned until visit was nearly over -My suspicion for neurodegenerative process is quite low -Discussed with patient and wife that if he would like to explore this, he will need a follow-up visit with our PA -Neuroexam is nonfocal and nonlateralizing  Sleep apnea -Faithful with CPAP    Subjective:   Discussed the use of AI scribe software for clinical note transcription with the patient, who gave verbal consent to proceed.  History of Present Illness Derek Figueroa is a 73 year old male with essential tremor who presents for evaluation of tremor management. He was referred by Dr. Joshua for evaluation of tremor.  His wife is on the phone and supplements the history.  He has experienced tremors for at least a couple of years, initially not bothersome but progressively noticeable. The tremor affects both hands, with the  right hand, which is dominant, being more affected. The tremor is most noticeable during activities requiring hand use, especially after physical exertion like using a drill or raking, and calms down with rest. Activities such as eating and drinking are not significantly affected unless preceded by physical exertion. Precision tasks like screwing in a screw are challenging if he has been using a drill beforehand.  He was first seen by a neurologist in June 2024 and was initially tried on gabapentin , which was not effective and caused side effects such as blurry vision, balance changes, and bowel issues. He was on a low dose of 200 mg twice a day before it was stopped. In December 2024, he was started on primidone , which was increased to 100 mg twice a day but was also found ineffective and was discontinued.  There is a family history of tremor, with his mother and possibly her parents having similar symptoms. He has two children, one biological and one adopted, with no known tremor in the biological child.  His caffeine intake includes about four cups of coffee in the morning and occasionally sweet tea when dining out. He consumes alcohol occasionally, typically one wine cooler or a shot of rum and coke per day, but not daily. Stress and anxiety can increase the tremor slightly.  He has sleep apnea and uses a CPAP machine consistently, which has improved his sleep. He reports good balance overall, with occasional instability on uneven terrain. He has no  significant issues with daily activities such as tying shoes or brushing teeth. He has had recent eye surgery and experiences occasional black specks in his vision, which are decreasing over time. No consistent nausea, vomiting, lightheadedness, or double vision.  He has not had recent CT or MRI scans of the brain. His handwriting has worsened over time, and he describes his mood as generally normal, with occasional stress. He reports that his night vision  is not great.  As we were wrapping up the visit, his wife mentioned that his memory is not good as it used to be.  Patient gives an example of sometimes he will take the trash out and will forget to throw a banana out with the trash.   PREVIOUS MEDICATIONS: primidone  up to 100 mg twice per day; gabapentin  200 mg twice per day (not helpful, concerns about blurry vision, balance changes, GI upset)  ALLERGIES:  No Known Allergies  CURRENT MEDICATIONS:  Current Outpatient Medications  Medication Instructions   acetaminophen  (TYLENOL ) 650 mg, Every 6 hours PRN   atorvastatin  (LIPITOR) 40 mg, Oral, Daily   esomeprazole  (NEXIUM ) 40 mg, Oral, Daily   latanoprost (XALATAN) 0.005 % ophthalmic solution Apply to eye. Eye doctor   losartan  (COZAAR ) 50 mg, Oral, Daily   Lutein 20 mg, Nightly   Multiple Vitamins-Minerals (MULTIVITAMIN ADULT PO) 1 tablet, Daily   Omega-3 Fatty Acids (FISH OIL PO) 1 capsule, Daily   Psyllium (EQ DAILY FIBER) 25 % POWD     Objective:   PHYSICAL EXAMINATION:    VITALS:   Vitals:   05/03/24 1305  BP: 136/88  Pulse: 83  SpO2: 98%  Weight: 185 lb (83.9 kg)  Height: 5' 8 (1.727 m)    GEN:  The patient appears stated age and is in NAD. HEENT:  Normocephalic, atraumatic.  The mucous membranes are moist. The superficial temporal arteries are without ropiness or tenderness. CV:  RRR Lungs:  CTAB Neck/HEME:  There are no carotid bruits bilaterally.  Neurological examination:  Orientation: The patient is alert and oriented x3.  Cranial nerves: There is good facial symmetry.  Extraocular muscles are intact. The visual fields are full to confrontational testing. The speech is fluent and clear. Soft palate rises symmetrically and there is no tongue deviation. Hearing is intact to conversational tone. Sensation: Sensation is intact to light touch throughout (facial, trunk, extremities). Vibration is intact at the bilateral big toe. There is no extinction with double  simultaneous stimulation.  Motor: Strength is 5/5 in the bilateral upper and lower extremities.   Shoulder shrug is equal and symmetric.  There is no pronator drift. Deep tendon reflexes: Deep tendon reflexes are 2/4 at the bilateral biceps, triceps, brachioradialis, patella and achilles. Plantar responses are downgoing bilaterally.  Movement examination: Tone: There is nl tone in the bilateral upper extremities.  The tone in the lower extremities is nl.  Abnormal movements: there is mild rest tremor in the RUE that increases with distraction procedure.  There is postural tremor and intention tremor, mild, R>L.  He has tremor with pouring water from one glass to another but he doesn't spill it.   Coordination:  There is no decremation with RAM's, with any form of RAMS, including alternating supination and pronation of the forearm, hand opening and closing, finger taps, heel taps and toe taps.  Gait and Station: The patient has no difficulty arising out of a deep-seated chair without the use of the hands. The patient's stride length is good.  The patient  is able to ambulate in a tandem fashion, but he does have to be careful when he does so. I have reviewed and interpreted the following labs independently   Chemistry      Component Value Date/Time   NA 138 04/27/2024 1002   NA 138 12/17/2013 0933   K 4.4 04/27/2024 1002   K 4.3 12/17/2013 0933   CL 104 04/27/2024 1002   CL 101 12/17/2013 0933   CO2 24 04/27/2024 1002   CO2 30 12/17/2013 0933   BUN 11 04/27/2024 1002   BUN 9 12/17/2013 0933   CREATININE 0.99 04/27/2024 1002   CREATININE 0.93 12/17/2013 0933      Component Value Date/Time   CALCIUM  9.0 04/27/2024 1002   CALCIUM  9.4 12/17/2013 0933   ALKPHOS 74 10/01/2023 1102   ALKPHOS 77 12/17/2013 0933   AST 19 10/01/2023 1102   AST 15 12/17/2013 0933   ALT 17 10/01/2023 1102   ALT 29 12/17/2013 0933   BILITOT 0.2 10/01/2023 1102   BILITOT 0.5 12/17/2013 0933      No results  found for: TSH Lab Results  Component Value Date   WBC 6.9 09/06/2021   HGB 15.1 09/06/2021   HCT 45.3 09/06/2021   MCV 87 09/06/2021   PLT 253 09/06/2021      Total time spent on today's visit was 75 minutes, including both face-to-face time and nonface-to-face time.  Time included that spent on review of records (prior notes available to me/labs/imaging if pertinent), discussing treatment and goals, answering patient's questions and coordinating care.  Cc:  Lemon Raisin, MD

## 2024-05-03 ENCOUNTER — Ambulatory Visit: Admitting: Neurology

## 2024-05-03 ENCOUNTER — Encounter: Payer: Self-pay | Admitting: Neurology

## 2024-05-03 ENCOUNTER — Other Ambulatory Visit

## 2024-05-03 ENCOUNTER — Ambulatory Visit: Payer: Self-pay | Admitting: Student

## 2024-05-03 VITALS — BP 136/88 | HR 83 | Ht 68.0 in | Wt 185.0 lb

## 2024-05-03 DIAGNOSIS — R251 Tremor, unspecified: Secondary | ICD-10-CM | POA: Diagnosis not present

## 2024-05-03 DIAGNOSIS — G250 Essential tremor: Secondary | ICD-10-CM | POA: Diagnosis not present

## 2024-05-03 DIAGNOSIS — I10 Essential (primary) hypertension: Secondary | ICD-10-CM

## 2024-05-03 DIAGNOSIS — R413 Other amnesia: Secondary | ICD-10-CM

## 2024-05-03 MED ORDER — PRIMIDONE 50 MG PO TABS: 150.0000 mg | ORAL_TABLET | Freq: Two times a day (BID) | ORAL | 1 refills | Status: AC

## 2024-05-03 MED ORDER — ESOMEPRAZOLE MAGNESIUM 40 MG PO CPDR: 40.0000 mg | DELAYED_RELEASE_CAPSULE | Freq: Every day | ORAL | 3 refills | Status: AC

## 2024-05-03 NOTE — Patient Instructions (Addendum)
 VISIT SUMMARY: Today, we discussed the management of your essential tremor, which has been progressively noticeable over the past few years. We reviewed your previous treatments and their side effects, and we have decided on a new plan to help manage your symptoms.  YOUR PLAN: -ESSENTIAL TREMOR: Essential tremor is a nervous system disorder that causes involuntary and rhythmic shaking, most commonly in the hands. We will restart your medication, primidone , and gradually increase the dose to 300 mg daily. Additionally, we have ordered a thyroid function test to rule out any thyroid-related issues that could be contributing to your symptoms. The prescription for primidone  has been sent to your pharmacy.  Week 1:  primidone  50 mg at bed Week 2:  primidone  :  50 mg in the AM and bed Week 3:  primidone  50 mg, 1 in the AM, 2 at bed Week 4: primidone  50 mg, 2 in the AM and 2 at bed Week 5:  primidone , 50 mg, 2 in the AM, 3 at bed Week 6 and thereafter:  primidone , 50 mg, 3 in the AM and 3 at bed  INSTRUCTIONS: Please follow up with the thyroid function test as ordered. Continue using your CPAP machine for sleep apnea as it has been beneficial. If you experience any side effects or if your symptoms do not improve, please contact our office for further evaluation.  If you decide to talk about memory change in more detail, you will need to make a follow up appointment with my PA Camie.  Your provider has requested that you have labwork completed today. The lab is located on the Second floor at [address], within the [facility] office. When you get off the elevator, turn right and go in the [facility] [address]; the first brown door on the left.  Tell the ladies behind the desk that you are there for lab work. If you are not called within 15 minutes please check with the front desk.   Once you complete your labs you are free to go. You will receive a call or message via MyChart with your  lab results.     The physicians and staff at [facility] are committed to providing excellent care. You may receive a survey requesting feedback about your experience at our office. We strive to receive very good responses to the survey questions. If you feel that your experience would prevent you from giving the office a very good  response, please contact our office to try to remedy the situation. We may be reached at [phone]. Thank you for taking the time out of your busy day to complete the survey.                       Contains text generated by Abridge.                                 Contains text generated by Abridge.

## 2024-05-04 ENCOUNTER — Ambulatory Visit: Payer: Self-pay | Admitting: Neurology

## 2024-05-04 LAB — TSH: TSH: 2.64 m[IU]/L (ref 0.40–4.50)

## 2024-05-18 DIAGNOSIS — G4733 Obstructive sleep apnea (adult) (pediatric): Secondary | ICD-10-CM | POA: Diagnosis not present

## 2024-05-23 ENCOUNTER — Other Ambulatory Visit: Payer: Self-pay | Admitting: Student

## 2024-05-24 NOTE — Telephone Encounter (Signed)
 Requested Prescriptions  Pending Prescriptions Disp Refills   atorvastatin  (LIPITOR) 40 MG tablet [Pharmacy Med Name: Atorvastatin  Calcium  40 MG Oral Tablet] 90 tablet 1    Sig: Take 1 tablet by mouth once daily     Cardiovascular:  Antilipid - Statins Failed - 05/24/2024  2:55 PM      Failed - Lipid Panel in normal range within the last 12 months    Cholesterol, Total  Date Value Ref Range Status  10/01/2023 223 (H) 100 - 199 mg/dL Final   LDL Chol Calc (NIH)  Date Value Ref Range Status  10/01/2023 146 (H) 0 - 99 mg/dL Final   HDL  Date Value Ref Range Status  10/01/2023 54 >39 mg/dL Final   Triglycerides  Date Value Ref Range Status  10/01/2023 128 0 - 149 mg/dL Final         Passed - Patient is not pregnant      Passed - Valid encounter within last 12 months    Recent Outpatient Visits           3 weeks ago Primary hypertension   [facility], [place]   1 month ago History of adenomatous polyp of colon   [facility], [place]   3 months ago Obstructive sleep apnea syndrome   [facility] at [facility], [place]   4 months ago Snoring   [facility] at [facility], MD   7 months ago Nocturia   [facility] at [facility] Jones, Deanna C, MD

## 2024-05-31 ENCOUNTER — Ambulatory Visit: Admitting: Sleep Medicine

## 2024-05-31 ENCOUNTER — Encounter: Payer: Self-pay | Admitting: Sleep Medicine

## 2024-05-31 VITALS — BP 120/80 | HR 100 | Temp 98.6°F | Ht 68.0 in | Wt 186.6 lb

## 2024-05-31 DIAGNOSIS — G4733 Obstructive sleep apnea (adult) (pediatric): Secondary | ICD-10-CM | POA: Diagnosis not present

## 2024-05-31 DIAGNOSIS — Z87891 Personal history of nicotine dependence: Secondary | ICD-10-CM

## 2024-05-31 DIAGNOSIS — I10 Essential (primary) hypertension: Secondary | ICD-10-CM | POA: Diagnosis not present

## 2024-05-31 NOTE — Progress Notes (Signed)
 "      Name:Derek Figueroa MRN: [id number] DOB: 02-24-1951   CHIEF COMPLAINT:  CPAP F/U   HISTORY OF PRESENT ILLNESS: Derek Figueroa is a 74 y.o. w/ a h/o OSA, HTN, GERD and glaucoma who presents for CPAP F/U. Reports using CPAP therapy every night, which is confirmed by compliance data. He is currently using the Airfit F40 FFM, which is comfortable. Denies air leaks or nasal congestion. Reports feeling more refreshed upon awakening with CPAP therapy.    EPWORTH SLEEP SCORE     02/01/2024    2:00 PM 01/21/2024   11:17 AM  Results of the Epworth flowsheet  Sitting and reading 1 0  Watching TV 1 2  Sitting, inactive in a public place (e.g. a theatre or a meeting) 0 0  As a passenger in a car for an hour without a break 0 0  Lying down to rest in the afternoon when circumstances permit 0 1  Sitting and talking to someone 0 0  Sitting quietly after a lunch without alcohol 0 0  In a car, while stopped for a few minutes in traffic 0 0  Total score 2 3    PAST MEDICAL HISTORY :   has a past medical history of Glaucoma, Heart murmur, Hyperlipidemia, and Hypertension.  has a past surgical history that includes Eye surgery; Hernia repair; ERCP (N/A, 08/13/2017); Robotic assisted laparoscopic cholecystectomy-multi site (N/A, 10/20/2017); Umbilical hernia repair (N/A, 10/20/2017); gall stones; Colonoscopy; and Colonoscopy with propofol  (N/A, 10/11/2019). Prior to Admission medications   Medication Sig Start Date End Date Taking? Authorizing Provider  acetaminophen  (TYLENOL ) 325 MG tablet Take 650 mg by mouth every 6 (six) hours as needed.   Yes [provider]  esomeprazole  (NEXIUM ) 40 MG capsule Take 1 capsule (40 mg total) by mouth daily. 10/01/23  Yes Joshua Cathryne BROCKS, MD  fluticasone  (FLONASE ) 50 MCG/ACT nasal spray Place 2 sprays into both nostrils daily. 04/09/23  Yes Joshua Cathryne BROCKS, MD  latanoprost (XALATAN) 0.005 % ophthalmic solution Apply to eye. Eye doctor 03/27/20  Yes  [provider]  Lutein 20 MG TABS Take 20 mg by mouth at bedtime.   Yes [provider]  meloxicam  (MOBIC ) 15 MG tablet Take 1 tablet (15 mg total) by mouth daily. 04/09/23  Yes Joshua Cathryne BROCKS, MD  metoprolol  succinate (TOPROL -XL) 50 MG 24 hr tablet TAKE 1 TABLET BY MOUTH ONCE DAILY. TAKE WITH OR IMMEDIATELY FOLLOWING MEAL 10/01/23  Yes Jones, Deanna C, MD  Multiple Vitamins-Minerals (MULTIVITAMIN ADULT PO) Take 1 tablet by mouth daily.    Yes [provider]  Omega-3 Fatty Acids (FISH OIL PO) Take 1 capsule by mouth daily.    Yes [provider]  primidone  (MYSOLINE ) 50 MG tablet Take 1 tablet by mouth 2 (two) times daily. 10/26/23  Yes [provider]  Probiotic Product (PROBIOTIC-10 PO)  09/14/22  Yes [provider]   No Known Allergies  FAMILY HISTORY:  family history includes COPD in his brother and father; Cancer in his brother, maternal grandfather, and maternal grandmother; Diabetes in his maternal grandfather and mother; Healthy in his adopted daughter; Heart disease in his father, maternal grandfather, maternal grandmother, and mother; Lung cancer in his brother; Mental illness in his daughter; Stroke in his father, maternal grandmother, and mother. SOCIAL HISTORY:  reports that he quit smoking about 46 years ago. His smoking use included cigarettes. He started smoking about 52 years ago. He has a 6 pack-year smoking history. He has  been exposed to tobacco smoke. He has never used smokeless tobacco. He reports current alcohol use of about 3.0 standard drinks of alcohol per week. He reports that he does not use drugs.   Review of Systems:  Gen:  Denies  fever, sweats, chills weight loss  HEENT: Denies blurred vision, double vision, ear pain, eye pain, hearing loss, nose bleeds, sore throat Cardiac:  No dizziness, chest pain or heaviness, chest tightness,edema, No JVD Resp:   No cough, -sputum production, -shortness of  breath,-wheezing, -hemoptysis,  Gi: Denies swallowing difficulty, stomach pain, nausea or vomiting, diarrhea, constipation, bowel incontinence Gu:  Denies bladder incontinence, burning urine Ext:   Denies Joint pain, stiffness or swelling Skin: Denies  skin rash, easy bruising or bleeding or hives Endoc:  Denies polyuria, polydipsia , polyphagia or weight change Psych:   Denies depression, insomnia or hallucinations  Other:  All other systems negative  VITAL SIGNS: BP 120/80   Pulse 100   Temp 98.6 F (37 C)   Ht 5' 8 (1.727 m)   Wt 186 lb 9.6 oz (84.6 kg)   SpO2 96%   BMI 28.37 kg/m     Physical Examination:   General Appearance: No distress  EYES PERRLA, EOM intact.   NECK Supple, No JVD Pulmonary: normal breath sounds, No wheezing.  CardiovascularNormal S1,S2.  No m/r/g.   Abdomen: Benign, Soft, non-tender. Skin:   warm, no rashes, no ecchymosis  Extremities: normal, no cyanosis, clubbing. Neuro:without focal findings,  speech normal  PSYCHIATRIC: Mood, affect within normal limits.   ASSESSMENT AND PLAN  OSA Patient is using and benefiting from CPAP therapy. Discussed the consequences of untreated sleep apnea. Advised not to drive drowsy for safety of patient and others. Will follow up in 3 months.    HTN Stable, on current management. Following with PCP.    Patient  satisfied with Plan of action and management. All questions answered  I spent a total of 28 minutes reviewing chart data, face-to-face evaluation with the patient, counseling and coordination of care as detailed above.    Karmine Kauer, M.D.  Sleep Medicine [facility]        "

## 2024-05-31 NOTE — Patient Instructions (Addendum)
 Patient Instructions  Continue to use CPAP every night, minimum of 7-8 hours a night.  Change equipment every 30 days or as directed by DME.  Wash your tubing with warm soap and water, hang to dry. Wash humidifier portion weekly. Use distilled water and change daily   Be aware of reduced alertness and do not drive or operate heavy machinery if experiencing this or drowsiness.  Exercise encouraged, as tolerated. Encouraged proper weight management.  Important to get eight or more hours of sleep  Limiting the use of the computer and television before bedtime.  Decrease naps during the day, so night time sleep will become enhanced.  Limit caffeine, and sleep deprivation.  HTN, stroke, uncontrolled diabetes and heart failure are potential risk factors.  Risk of untreated sleep apnea including cardiac arrhthymias, stroke, DM, pulm HTN.

## 2024-06-16 NOTE — Addendum Note (Signed)
 Addended by: Demontrae Gilbert on: 06/16/2024 12:30 PM   Modules accepted: Level of Service

## 2024-08-29 ENCOUNTER — Ambulatory Visit: Admitting: Sleep Medicine

## 2024-09-12 ENCOUNTER — Encounter: Admitting: Student

## 2024-11-01 ENCOUNTER — Ambulatory Visit: Payer: Self-pay | Admitting: Neurology
